# Patient Record
Sex: Male | Born: 1955 | Race: White | Hispanic: No | Marital: Married | Smoking: Never smoker
Health system: Southern US, Community
[De-identification: ages and names within clinical notes are randomized; demographics above are authoritative.]

## PROBLEM LIST (undated history)

## (undated) DIAGNOSIS — Z933 Colostomy status: Secondary | ICD-10-CM

## (undated) DIAGNOSIS — T7840XA Allergy, unspecified, initial encounter: Secondary | ICD-10-CM

## (undated) DIAGNOSIS — N503 Cyst of epididymis: Secondary | ICD-10-CM

## (undated) DIAGNOSIS — K635 Polyp of colon: Secondary | ICD-10-CM

## (undated) DIAGNOSIS — D126 Benign neoplasm of colon, unspecified: Secondary | ICD-10-CM

## (undated) DIAGNOSIS — K219 Gastro-esophageal reflux disease without esophagitis: Secondary | ICD-10-CM

## (undated) DIAGNOSIS — K5792 Diverticulitis of intestine, part unspecified, without perforation or abscess without bleeding: Secondary | ICD-10-CM

## (undated) DIAGNOSIS — I10 Essential (primary) hypertension: Secondary | ICD-10-CM

## (undated) DIAGNOSIS — K5790 Diverticulosis of intestine, part unspecified, without perforation or abscess without bleeding: Secondary | ICD-10-CM

## (undated) DIAGNOSIS — J45909 Unspecified asthma, uncomplicated: Secondary | ICD-10-CM

## (undated) DIAGNOSIS — E785 Hyperlipidemia, unspecified: Secondary | ICD-10-CM

## (undated) DIAGNOSIS — E790 Hyperuricemia without signs of inflammatory arthritis and tophaceous disease: Secondary | ICD-10-CM

## (undated) HISTORY — PX: APPENDECTOMY: SHX54

## (undated) HISTORY — PX: WISDOM TOOTH EXTRACTION: SHX21

## (undated) HISTORY — DX: Polyp of colon: K63.5

## (undated) HISTORY — DX: Unspecified asthma, uncomplicated: J45.909

## (undated) HISTORY — DX: Hyperuricemia without signs of inflammatory arthritis and tophaceous disease: E79.0

## (undated) HISTORY — DX: Diverticulitis of intestine, part unspecified, without perforation or abscess without bleeding: K57.92

## (undated) HISTORY — DX: Diverticulosis of intestine, part unspecified, without perforation or abscess without bleeding: K57.90

## (undated) HISTORY — PX: OTHER SURGICAL HISTORY: SHX169

## (undated) HISTORY — DX: Cyst of epididymis: N50.3

## (undated) HISTORY — DX: Benign neoplasm of colon, unspecified: D12.6

## (undated) HISTORY — DX: Hyperlipidemia, unspecified: E78.5

## (undated) HISTORY — DX: Allergy, unspecified, initial encounter: T78.40XA

## (undated) HISTORY — DX: Essential (primary) hypertension: I10

## (undated) HISTORY — DX: Gastro-esophageal reflux disease without esophagitis: K21.9

---
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
## 1999-02-09 ENCOUNTER — Encounter: Payer: Self-pay | Admitting: Internal Medicine

## 1999-02-09 ENCOUNTER — Ambulatory Visit (HOSPITAL_COMMUNITY): Admission: RE | Admit: 1999-02-09 | Discharge: 1999-02-09 | Payer: Self-pay | Admitting: Internal Medicine

## 2000-03-08 ENCOUNTER — Encounter (INDEPENDENT_AMBULATORY_CARE_PROVIDER_SITE_OTHER): Payer: Self-pay | Admitting: Specialist

## 2000-03-08 ENCOUNTER — Other Ambulatory Visit: Admission: RE | Admit: 2000-03-08 | Discharge: 2000-03-08 | Payer: Self-pay | Admitting: Gastroenterology

## 2004-08-18 ENCOUNTER — Ambulatory Visit: Payer: Self-pay | Admitting: Internal Medicine

## 2004-08-29 ENCOUNTER — Ambulatory Visit: Payer: Self-pay | Admitting: Internal Medicine

## 2004-09-19 ENCOUNTER — Encounter: Admission: RE | Admit: 2004-09-19 | Discharge: 2004-09-19 | Payer: Self-pay | Admitting: Internal Medicine

## 2004-10-19 ENCOUNTER — Ambulatory Visit: Payer: Self-pay | Admitting: Internal Medicine

## 2004-12-22 ENCOUNTER — Ambulatory Visit: Payer: Self-pay | Admitting: Internal Medicine

## 2004-12-26 ENCOUNTER — Encounter: Payer: Self-pay | Admitting: Internal Medicine

## 2005-09-19 ENCOUNTER — Encounter: Payer: Self-pay | Admitting: Internal Medicine

## 2007-07-18 DIAGNOSIS — D126 Benign neoplasm of colon, unspecified: Secondary | ICD-10-CM

## 2007-07-18 HISTORY — DX: Benign neoplasm of colon, unspecified: D12.6

## 2008-02-26 ENCOUNTER — Encounter (INDEPENDENT_AMBULATORY_CARE_PROVIDER_SITE_OTHER): Payer: Self-pay | Admitting: *Deleted

## 2008-02-26 ENCOUNTER — Ambulatory Visit: Payer: Self-pay | Admitting: Internal Medicine

## 2008-02-26 DIAGNOSIS — J45909 Unspecified asthma, uncomplicated: Secondary | ICD-10-CM | POA: Insufficient documentation

## 2008-02-26 DIAGNOSIS — T783XXA Angioneurotic edema, initial encounter: Secondary | ICD-10-CM | POA: Insufficient documentation

## 2008-02-26 DIAGNOSIS — E785 Hyperlipidemia, unspecified: Secondary | ICD-10-CM | POA: Insufficient documentation

## 2008-02-26 DIAGNOSIS — R7309 Other abnormal glucose: Secondary | ICD-10-CM | POA: Insufficient documentation

## 2008-02-27 ENCOUNTER — Telehealth: Payer: Self-pay | Admitting: Internal Medicine

## 2008-02-28 ENCOUNTER — Encounter (INDEPENDENT_AMBULATORY_CARE_PROVIDER_SITE_OTHER): Payer: Self-pay | Admitting: *Deleted

## 2008-03-16 ENCOUNTER — Ambulatory Visit: Payer: Self-pay | Admitting: Gastroenterology

## 2008-03-30 ENCOUNTER — Encounter: Payer: Self-pay | Admitting: Gastroenterology

## 2008-03-30 ENCOUNTER — Ambulatory Visit: Payer: Self-pay | Admitting: Gastroenterology

## 2008-03-31 ENCOUNTER — Telehealth: Payer: Self-pay | Admitting: Gastroenterology

## 2008-04-01 ENCOUNTER — Encounter: Payer: Self-pay | Admitting: Gastroenterology

## 2008-04-02 ENCOUNTER — Telehealth: Payer: Self-pay | Admitting: Gastroenterology

## 2008-04-29 DIAGNOSIS — Z860100 Personal history of colon polyps, unspecified: Secondary | ICD-10-CM | POA: Insufficient documentation

## 2008-04-29 DIAGNOSIS — K5730 Diverticulosis of large intestine without perforation or abscess without bleeding: Secondary | ICD-10-CM | POA: Insufficient documentation

## 2009-06-16 ENCOUNTER — Ambulatory Visit: Payer: Self-pay | Admitting: Internal Medicine

## 2009-06-16 DIAGNOSIS — I10 Essential (primary) hypertension: Secondary | ICD-10-CM | POA: Insufficient documentation

## 2010-08-14 LAB — CONVERTED CEMR LAB
ALT: 23 units/L (ref 0–53)
AST: 17 units/L (ref 0–37)
Albumin: 4.2 g/dL (ref 3.5–5.2)
Alkaline Phosphatase: 55 units/L (ref 39–117)
BUN: 20 mg/dL (ref 6–23)
Basophils Absolute: 0 10*3/uL (ref 0.0–0.1)
Basophils Relative: 0.4 % (ref 0.0–3.0)
Bilirubin, Direct: 0.1 mg/dL (ref 0.0–0.3)
CO2: 28 meq/L (ref 19–32)
Calcium: 9.5 mg/dL (ref 8.4–10.5)
Chloride: 106 meq/L (ref 96–112)
Creatinine, Ser: 1.5 mg/dL (ref 0.4–1.5)
Eosinophils Absolute: 0.2 10*3/uL (ref 0.0–0.7)
Eosinophils Relative: 2.8 % (ref 0.0–5.0)
GFR calc Af Amer: 63 mL/min
GFR calc non Af Amer: 52 mL/min
Glucose, Bld: 93 mg/dL (ref 70–99)
HCT: 44.8 % (ref 39.0–52.0)
Hemoglobin: 15.6 g/dL (ref 13.0–17.0)
Hgb A1c MFr Bld: 5.9 % (ref 4.6–6.0)
Lymphocytes Relative: 30.7 % (ref 12.0–46.0)
MCHC: 34.8 g/dL (ref 30.0–36.0)
MCV: 86.5 fL (ref 78.0–100.0)
Monocytes Absolute: 0.6 10*3/uL (ref 0.1–1.0)
Monocytes Relative: 10.6 % (ref 3.0–12.0)
Neutro Abs: 3.1 10*3/uL (ref 1.4–7.7)
Neutrophils Relative %: 55.5 % (ref 43.0–77.0)
PSA: 0.83 ng/mL (ref 0.10–4.00)
Platelets: 181 10*3/uL (ref 150–400)
Potassium: 4 meq/L (ref 3.5–5.1)
RBC: 5.18 M/uL (ref 4.22–5.81)
RDW: 13.4 % (ref 11.5–14.6)
Sodium: 142 meq/L (ref 135–145)
TSH: 1.79 microintl units/mL (ref 0.35–5.50)
Total Bilirubin: 0.8 mg/dL (ref 0.3–1.2)
Total Protein: 7.2 g/dL (ref 6.0–8.3)
WBC: 5.6 10*3/uL (ref 4.5–10.5)

## 2011-06-15 ENCOUNTER — Encounter: Payer: Self-pay | Admitting: Gastroenterology

## 2011-08-15 ENCOUNTER — Ambulatory Visit (INDEPENDENT_AMBULATORY_CARE_PROVIDER_SITE_OTHER): Payer: 59 | Admitting: Internal Medicine

## 2011-08-15 VITALS — BP 160/88 | HR 110 | Temp 99.0°F | Wt 282.0 lb

## 2011-08-15 DIAGNOSIS — J329 Chronic sinusitis, unspecified: Secondary | ICD-10-CM

## 2011-08-15 MED ORDER — AMOXICILLIN 500 MG PO CAPS: 1000.0000 mg | ORAL_CAPSULE | Freq: Two times a day (BID) | ORAL | Status: AC

## 2011-08-15 NOTE — Progress Notes (Signed)
  Subjective:    Patient ID: Javier Casey, male    DOB: May 20, 1956, 56 y.o.   MRN: [id number]  HPI Acute visit Reportedly he had the flu between Christmas and new year, stayed home all week, did not see a doctor or took meds. Since then symptoms are better but he is "dragging", not feeling completely well The last 4 days, the cough has increased, he has more nasal congestion and postnasal drip. Has not taken any over-the-counter medicines except Halls for cough  Past Medical History: Asthma (EIB) Hyperlipidemia DIVERTICULOSIS, COLON (ICD-562.10) COLONIC POLYPS, HX OF (ICD-V12.72) HYPERGLYCEMIA (ICD-790.29) ANGIOEDEMA (ICD-995.1)    Review of Systems Denies fever or chills No nausea, vomiting, diarrhea No sputum production per se but he has some white nasal discharge.    Objective:   Physical Exam  Constitutional: He appears well-developed. No distress.  HENT:  Head: Normocephalic and atraumatic.  Right Ear: External ear normal.  Left Ear: External ear normal.       Asymmetric, nontender to palpation. Nose congested. Throat without redness or discharge  Cardiovascular: Normal rate, regular rhythm and normal heart sounds.   No murmur heard. Pulmonary/Chest: Effort normal. No respiratory distress. He has no wheezes. He has no rales.  Musculoskeletal: He exhibits no edema.  Skin: He is not diaphoretic.       Assessment & Plan:  Mild sinusitis: See instruction Hypertension: Was prescribed amlodipine in the past, has not taken any medications a long time. BP today is elevated but ambulatory blood pressures are taken on and off by the patient and they're normal. See instructions

## 2011-08-15 NOTE — Patient Instructions (Signed)
Rest, fluids , tylenol For cough, take Mucinex DM twice a day as needed  For congestion use dymista 2 sprays on each side of the nose at night until sample is gone  Take the antibiotic as prescribed ----> Amoxicillin Call if no better in few days Call anytime if the symptoms are severe ------- Your BP is elevated today Check the  blood pressure 2 or 3 times a week, be sure it is less than 140/85. If it is consistently higher, let Hopp know Also arrange a visit with him for a check up within 6 weeks

## 2011-08-16 ENCOUNTER — Encounter: Payer: Self-pay | Admitting: Internal Medicine

## 2011-10-06 ENCOUNTER — Encounter: Payer: Self-pay | Admitting: Internal Medicine

## 2011-10-06 ENCOUNTER — Ambulatory Visit (INDEPENDENT_AMBULATORY_CARE_PROVIDER_SITE_OTHER): Payer: 59 | Admitting: Internal Medicine

## 2011-10-06 VITALS — BP 130/88 | HR 74 | Temp 98.3°F | Resp 12 | Ht 75.5 in | Wt 283.6 lb

## 2011-10-06 DIAGNOSIS — J45909 Unspecified asthma, uncomplicated: Secondary | ICD-10-CM

## 2011-10-06 DIAGNOSIS — Z136 Encounter for screening for cardiovascular disorders: Secondary | ICD-10-CM

## 2011-10-06 DIAGNOSIS — T783XXA Angioneurotic edema, initial encounter: Secondary | ICD-10-CM

## 2011-10-06 DIAGNOSIS — Z0000 Encounter for general adult medical examination without abnormal findings: Secondary | ICD-10-CM
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
## 2011-10-06 NOTE — Patient Instructions (Addendum)
Preventive Health Care: Exercise at least 30-45 minutes a day,  3-4 days a week.  Eat a low-fat diet with lots of fruits and vegetables, up to 7-9 servings per day. Consume less than 40 (preferably ZERO) grams of sugar per day from foods & drinks with High Fructose Corn Syrup (HFCS) sugar as #1,2,3 or # 4 on label.Whole Foods, [employer] & [employer] do not carry products with HFCS. Follow a  low carb nutrition program such as [place] or [employer]  to prevent Diabetes progression . White carbohydrates (potatoes, rice, bread, and pasta) have a high spike of sugar and a high load of sugar. For example a  baked potato has a cup of sugar and a  french fry  2 teaspoons of sugar. Yams, wild  rice, whole grained bread &  wheat pasta have been much lower spike and load of  sugar. Portions should be the size of a deck of cards or your palm.  Health Care Power of Attorney & Living Will. Complete if not in place ; these place you in charge of your health care decisions. Blood Pressure Goal  Ideally is an AVERAGE < 135/85. This AVERAGE should be calculated from @ least 5-7 BP readings taken @ different times of day on different days of week. You should not respond to isolated BP readings , but rather the AVERAGE for that week . Please consider  scheduling fasting Labs : BMET,Lipids, hepatic panel, CBC & dif, TSH. PLEASE BRING THESE INSTRUCTIONS TO FOLLOW UP  LAB APPOINTMENT.This will guarantee correct labs are drawn, eliminating need for repeat blood sampling ( needle sticks ! ). Diagnoses /Codes: V70.0 As per the Standard of Care , screening Colonoscopy recommended @ 50 & every 5-10 years thereafter . More frequent monitor would be dictated by family history or findings @ Colonoscopy

## 2011-10-11 ENCOUNTER — Encounter: Payer: Self-pay | Admitting: *Deleted

## 2012-04-08 ENCOUNTER — Encounter: Payer: Self-pay | Admitting: Gastroenterology

## 2013-07-28 ENCOUNTER — Encounter: Payer: Self-pay | Admitting: Internal Medicine

## 2013-07-28 ENCOUNTER — Ambulatory Visit (INDEPENDENT_AMBULATORY_CARE_PROVIDER_SITE_OTHER): Payer: 59 | Admitting: Internal Medicine

## 2013-07-28 VITALS — BP 148/68 | HR 82 | Temp 98.4°F | Wt 294.0 lb

## 2013-07-28 DIAGNOSIS — Z86010 Personal history of colonic polyps: Secondary | ICD-10-CM

## 2013-07-28 DIAGNOSIS — M722 Plantar fascial fibromatosis: Secondary | ICD-10-CM

## 2013-07-28 DIAGNOSIS — E785 Hyperlipidemia, unspecified: Secondary | ICD-10-CM

## 2013-07-28 DIAGNOSIS — I10 Essential (primary) hypertension: Secondary | ICD-10-CM

## 2013-07-28 DIAGNOSIS — T887XXA Unspecified adverse effect of drug or medicament, initial encounter: Secondary | ICD-10-CM

## 2013-07-28 MED ORDER — HYDROCHLOROTHIAZIDE 12.5 MG PO CAPS: 12.5000 mg | ORAL_CAPSULE | Freq: Every day | ORAL | Status: DC

## 2013-07-28 NOTE — Assessment & Plan Note (Signed)
As per the Standard of Care , screening Colonoscopy recommended now

## 2013-07-28 NOTE — Progress Notes (Signed)
   Subjective:    Patient ID: Javier Casey, male    DOB: 25-Aug-1955, 58 y.o.   MRN: [id number]  HPI Itchy spot on ball of right foot. Right foot hurts in heel, achilles tendon, and lateral aspect of foot since February 16, 2013. Pain 3 to 7/10. No precipitating factor. Never had this before. Standing for long periods and and walking on concrete makes pain worse. Hurts first step out of bed in am. Had foot and leg cramps also. Ibuprofen helps a small amount.     Review of Systems Blood pressure  average 130/86. Anti hypertemsive medication was not refilled when he ran out. Significant headaches, epistaxis, chest pain, palpitations, exertional dyspnea, claudication, paroxysmal nocturnal dyspnea, or edema absent.  A heart healthy diet is not followed; no regular exercise. Family history is negative for premature coronary disease. Advanced cholesterol testing reveals  LDL goal is less than 100 ; ideally < 70 . There is medication compliance with the statin.  Low dose ASA not taken Specifically denied are  chest pain, palpitations, dyspnea, or claudication.       Objective:   Physical Exam Gen.: well-nourished in appearance. Weight excess.Alert, appropriate and cooperative throughout exam.  Head: Normocephalic without obvious abnormalities Eyes: No corneal or conjunctival inflammation noted. Pupils equal round reactive to light and accommodation. Extraocular motion intact. Nose: External nasal exam reveals no deformity or inflammation. Nasal mucosa are pink and moist. No lesions or exudates noted.   Mouth: Oral mucosa and oropharynx reveal no lesions or exudates. Teeth in good repair. Neck: No deformities, masses, or tenderness noted.  Thyroid normal. Lungs: Normal respiratory effort; chest expands symmetrically. Lungs are clear to auscultation without rales, wheezes, or increased work of breathing. Heart: Normal rate and rhythm. Normal S1 and S2. No gallop, click, or rub. S4 w/o  murmur. Abdomen: Bowel sounds normal; abdomen soft and nontender. No masses or organomegaly . Ventral hernia noted.                                   Musculoskeletal/extremities: No deformity or scoliosis noted of  the thoracic or lumbar spine.  No clubbing, cyanosis or significant extremity  deformity noted. 1 + edema.Range of motion normal .Tone & strength normal. Hand joints normal . Fingernail / toenail health good. Able to lie down & sit up w/o help. Negative SLR bilaterally. Plantar fascia tender to percussion Vascular: Carotid, radial artery, dorsalis pedis and  posterior tibial pulses are full and equal. No bruits present. Neurologic: Alert and oriented x3. Deep tendon reflexes symmetrical and normal.       Skin: Intact without suspicious lesions or rashes. Lymph: No cervical, axillary lymphadenopathy present. Psych: Mood and affect are normal. Normally interactive                                                                                        Assessment & Plan:  See Current Assessment & Plan in Problem List under specific Diagnosis

## 2013-07-28 NOTE — Assessment & Plan Note (Signed)
Goals discussed  BMET HCTZ 12.5 mg

## 2013-07-28 NOTE — Assessment & Plan Note (Signed)
See recommendations in the after visit summary

## 2013-07-28 NOTE — Patient Instructions (Signed)
Roll the affected foot over a tennis ball 20 times twice a day. After this soak the foot in warm Epsom salts for 15-20 minutes. Wear arch supports in both shoes. Referral  To Dr Charlann Boxer if symptoms persist.  Please  schedule fasting Labs : BMET,Lipids, hepatic panel, CBC & dif, TSH, uric acid. Minimal Blood Pressure Goal= AVERAGE < 140/90;  Ideal is an AVERAGE < 135/85. This AVERAGE should be calculated from @ least 5-7 BP readings taken @ different times of day on different days of week. You should not respond to isolated BP readings , but rather the AVERAGE for that week .Please bring your  blood pressure cuff to office visits to verify that it is reliable.It  can also be checked against the blood pressure device at the pharmacy. Finger or wrist cuffs are not dependable; an arm cuff is. As per the Standard of Care , screening Colonoscopy recommended @ 50 & every 5-10 years thereafter . More frequent monitor would be dictated by family history or findings @ Colonoscopy

## 2013-07-28 NOTE — Assessment & Plan Note (Signed)
TSH, lipids fasting

## 2013-08-01 ENCOUNTER — Other Ambulatory Visit (INDEPENDENT_AMBULATORY_CARE_PROVIDER_SITE_OTHER): Payer: 59

## 2013-08-01 DIAGNOSIS — T887XXA Unspecified adverse effect of drug or medicament, initial encounter: Secondary | ICD-10-CM

## 2013-08-01 DIAGNOSIS — E785 Hyperlipidemia, unspecified: Secondary | ICD-10-CM

## 2013-08-01 DIAGNOSIS — Z86010 Personal history of colonic polyps: Secondary | ICD-10-CM

## 2013-08-01 DIAGNOSIS — I10 Essential (primary) hypertension: Secondary | ICD-10-CM

## 2013-08-01 LAB — LIPID PANEL
CHOL/HDL RATIO: 6
CHOLESTEROL: 222 mg/dL — AB (ref 0–200)
HDL: 34.8 mg/dL — AB (ref >39.00)
TRIGLYCERIDES: 144 mg/dL (ref 0.0–149.0)
VLDL: 28.8 mg/dL (ref 0.0–40.0)

## 2013-08-01 LAB — BASIC METABOLIC PANEL
BUN: 19 mg/dL (ref 6–23)
CALCIUM: 9.1 mg/dL (ref 8.4–10.5)
CO2: 26 mEq/L (ref 19–32)
CREATININE: 1.7 mg/dL — AB (ref 0.4–1.5)
Chloride: 101 mEq/L (ref 96–112)
GFR: 45.15 mL/min — AB (ref >60.00)
Glucose, Bld: 94 mg/dL (ref 70–99)
Potassium: 3.9 mEq/L (ref 3.5–5.1)
Sodium: 136 mEq/L (ref 135–145)

## 2013-08-01 LAB — HEPATIC FUNCTION PANEL
ALBUMIN: 4 g/dL (ref 3.5–5.2)
ALT: 27 U/L (ref 0–53)
AST: 18 U/L (ref 0–37)
Alkaline Phosphatase: 57 U/L (ref 39–117)
BILIRUBIN TOTAL: 0.8 mg/dL (ref 0.3–1.2)
Bilirubin, Direct: 0 mg/dL (ref 0.0–0.3)
Total Protein: 7.2 g/dL (ref 6.0–8.3)

## 2013-08-01 LAB — CBC WITH DIFFERENTIAL/PLATELET
Basophils Absolute: 0 10*3/uL (ref 0.0–0.1)
Basophils Relative: 0.7 % (ref 0.0–3.0)
Eosinophils Absolute: 0.2 10*3/uL (ref 0.0–0.7)
Eosinophils Relative: 3.9 % (ref 0.0–5.0)
HCT: 44.8 % (ref 39.0–52.0)
Hemoglobin: 15.2 g/dL (ref 13.0–17.0)
LYMPHS ABS: 1.6 10*3/uL (ref 0.7–4.0)
Lymphocytes Relative: 25.8 % (ref 12.0–46.0)
MCHC: 34 g/dL (ref 30.0–36.0)
MCV: 85.2 fl (ref 78.0–100.0)
Monocytes Absolute: 0.7 10*3/uL (ref 0.1–1.0)
Monocytes Relative: 10.5 % (ref 3.0–12.0)
NEUTROS PCT: 59.1 % (ref 43.0–77.0)
Neutro Abs: 3.7 10*3/uL (ref 1.4–7.7)
Platelets: 200 10*3/uL (ref 150.0–400.0)
RBC: 5.26 Mil/uL (ref 4.22–5.81)
RDW: 14.3 % (ref 11.5–14.6)
WBC: 6.2 10*3/uL (ref 4.5–10.5)

## 2013-08-01 LAB — TSH: TSH: 1.26 u[IU]/mL (ref 0.35–5.50)

## 2013-08-01 LAB — LDL CHOLESTEROL, DIRECT: LDL DIRECT: 170.4 mg/dL

## 2013-08-01 LAB — URIC ACID: Uric Acid, Serum: 7.9 mg/dL — ABNORMAL HIGH (ref 4.0–7.8)

## 2013-08-03 ENCOUNTER — Other Ambulatory Visit: Payer: Self-pay | Admitting: Internal Medicine

## 2013-08-03 DIAGNOSIS — E790 Hyperuricemia without signs of inflammatory arthritis and tophaceous disease: Secondary | ICD-10-CM | POA: Insufficient documentation

## 2013-08-03 DIAGNOSIS — E785 Hyperlipidemia, unspecified: Secondary | ICD-10-CM

## 2013-08-03 DIAGNOSIS — I10 Essential (primary) hypertension: Secondary | ICD-10-CM

## 2013-08-03 MED ORDER — ATORVASTATIN CALCIUM 20 MG PO TABS: 20.0000 mg | ORAL_TABLET | Freq: Every day | ORAL | Status: DC

## 2013-08-03 MED ORDER — LOSARTAN POTASSIUM 100 MG PO TABS: 100.0000 mg | ORAL_TABLET | Freq: Every day | ORAL | Status: DC

## 2013-08-04 ENCOUNTER — Encounter: Payer: Self-pay | Admitting: Nurse Practitioner

## 2013-08-04 ENCOUNTER — Ambulatory Visit (INDEPENDENT_AMBULATORY_CARE_PROVIDER_SITE_OTHER): Payer: 59 | Admitting: Nurse Practitioner

## 2013-08-04 VITALS — BP 140/84 | HR 104 | Temp 98.0°F | Ht 75.5 in | Wt 289.0 lb

## 2013-08-04 DIAGNOSIS — N419 Inflammatory disease of prostate, unspecified: Secondary | ICD-10-CM

## 2013-08-04 DIAGNOSIS — N400 Enlarged prostate without lower urinary tract symptoms: Secondary | ICD-10-CM

## 2013-08-04 MED ORDER — SULFAMETHOXAZOLE-TMP DS 800-160 MG PO TABS: 1.0000 | ORAL_TABLET | Freq: Two times a day (BID) | ORAL | Status: DC

## 2013-08-04 MED ORDER — TERAZOSIN HCL 1 MG PO CAPS: 1.0000 mg | ORAL_CAPSULE | Freq: Every day | ORAL | Status: DC

## 2013-08-04 NOTE — Progress Notes (Signed)
   Subjective:    Patient ID: Javier Casey, male    DOB: 10-06-1955, 58 y.o.   MRN: [id number]  Abdominal Pain This is a new problem. The current episode started today. The onset quality is sudden. The problem occurs constantly. The most recent episode lasted 8 hours. The problem has been gradually worsening. The pain is located in the suprapubic region. The pain is moderate. The quality of the pain is aching. The abdominal pain does not radiate. Associated symptoms include dysuria and frequency. Pertinent negatives include no constipation, diarrhea, fever, headaches, hematuria or nausea. Associated symptoms comments: Decreased urine stream . Nothing aggravates the pain. The pain is relieved by nothing. He has tried nothing for the symptoms. diverticulosis & colon polyps      Review of Systems  Constitutional: Negative for fever, chills, activity change, appetite change and fatigue.  HENT: Negative for congestion.   Respiratory: Negative for cough and shortness of breath.   Cardiovascular: Negative for chest pain.  Gastrointestinal: Positive for abdominal pain. Negative for nausea, diarrhea and constipation.  Genitourinary: Positive for dysuria, urgency, frequency and decreased urine volume. Negative for hematuria, flank pain, discharge, scrotal swelling, penile pain and testicular pain.  Musculoskeletal: Positive for back pain (chronic, no change).  Neurological: Negative for headaches.       Objective:   Physical Exam  Vitals reviewed. Constitutional: He is oriented to person, place, and time. He appears well-developed and well-nourished. He appears distressed (states he is in a lot of pain, hasn't tried any OTC meds.).  HENT:  Head: Normocephalic and atraumatic.  Eyes: Conjunctivae are normal. Right eye exhibits no discharge. Left eye exhibits no discharge.  Cardiovascular: Regular rhythm and normal heart sounds.   No murmur heard. Mild tachycardia   Pulmonary/Chest: Effort  normal and breath sounds normal. No respiratory distress. He has no wheezes.  Abdominal: Soft. Bowel sounds are normal. He exhibits distension. He exhibits no mass. There is no tenderness. There is no rebound and no guarding.  obese  Musculoskeletal: He exhibits no tenderness (no CVA tenderness).  Neurological: He is alert and oriented to person, place, and time.  Skin: Skin is warm and dry.  Psychiatric: He has a normal mood and affect. His behavior is normal. Thought content normal.          Assessment & Plan:  1. Prostatitis  Suprapubic pressure w/ frequency DD: cystitis - sulfamethoxazole-trimethoprim (BACTRIM DS) 800-160 MG per tablet; Take 1 tablet by mouth 2 (two) times daily.  Dispense: 28 tablet; Refill: 0 - POCT urinalysis dipstick- sm blood, trace protein.  - Urinalysis, Routine w reflex microscopic - Urine culture  2. Benign prostatic hypertrophy Decreased urine stream, frequency,  - terazosin (HYTRIN) 1 MG capsule; Take 1 capsule (1 mg total) by mouth at bedtime.  Dispense: 15 capsule; Refill: 0

## 2013-08-04 NOTE — Progress Notes (Signed)
Pre-visit discussion using our clinic review tool. No additional management support is needed unless otherwise documented below in the visit note.  

## 2013-08-04 NOTE — Patient Instructions (Signed)
Please start antibiotic and terazosin. You may take 1000 mg tylenol 3 times daily for pain. Please sip water every hour to flush kidneys & bladder. Please follow up with Dr Linna Darner in 2 weeks or sooner if you are feeling worse or have not improved within 3-4 days.   Prostatitis The prostate gland is about the size and shape of a walnut. It is located just below your bladder. It produces one of the components of semen, which is made up of sperm and the fluids that help nourish and transport it out from the testicles. Prostatitis is inflammation of the prostate gland.  There are four types of prostatitis:  Acute bacterial prostatitis This is the least common type of prostatitis. It starts quickly and usually is associated with a bladder infection, high fever, and shaking chills. It can occur at any age.  Chronic bacterial prostatitis This is a persistent bacterial infection in the prostate. It usually develops from repeated acute bacterial prostatitis or acute bacterial prostatitis that was not properly treated. It can occur in men of any age but is most common in middle-aged men whose prostate has begun to enlarge. The symptoms are not as severe as those in acute bacterial prostatitis. Discomfort in the part of your body that is in front of your rectum and below your scrotum (perineum), lower abdomen, or in the head of your penis (glans) may represent your primary discomfort.  Chronic prostatitis (nonbacterial) This is the most common type of prostatitis. It is inflammation of the prostate gland that is not caused by a bacterial infection. The cause is unknown and may be associated with a viral infection or autoimmune disorder.  Prostatodynia (pelvic floor disorder) This is associated with increased muscular tone in the pelvis surrounding the prostate. CAUSES The causes of bacterial prostatitis are bacterial infection. The causes of the other types of prostatitis are unknown.  SYMPTOMS  Symptoms can  vary depending upon the type of prostatitis that exists. There can also be overlap in symptoms. Possible symptoms for each type of prostatitis are listed below. Acute Bacterial Prostatitis  Painful urination.  Fever or chills.  Muscle or joint pains.  Low back pain.  Low abdominal pain.  Inability to empty bladder completely. Chronic Bacterial Prostatitis, Chronic Nonbacterial Prostatitis, and Prostatodynia  Sudden urge to urinate.  Frequent urination.  Difficulty starting urine stream.  Weak urine stream.  Discharge from the urethra.  Dribbling after urination.  Rectal pain.  Pain in the testicles, penis, or tip of the penis.  Pain in the perineum.  Problems with sexual function.  Painful ejaculation.  Bloody semen. DIAGNOSIS  In order to diagnose prostatitis, your health care provider will ask about your symptoms. One or more urine samples will be taken and tested (urinalysis). If the urinalysis result is negative for bacteria, your health care provider may use a finger to feel your prostate (digital rectal exam). This exam helps your health care provider determine if your prostate is swollen and tender. It will also produce a specimen of semen that can be analyzed. TREATMENT  Treatment for prostatitis depends on the cause. If a bacterial infection is the cause, it can be treated with antibiotic medicine. In cases of chronic bacterial prostatitis, the use of antibiotics for up to 1 month or 6 weeks may be necessary. Your health care provider may instruct you to take sitz baths to help relieve pain. A sitz bath is a bath of hot water in which your hips and buttocks are under  water. This relaxes the pelvic floor muscles and often helps to relieve the pressure on your prostate. HOME CARE INSTRUCTIONS   Take all medicines as directed by your health care provider.  Take sitz baths as directed by your health care provider. SEEK MEDICAL CARE IF:   Your symptoms get worse,  not better.  You have a fever. SEEK IMMEDIATE MEDICAL CARE IF:   You have chills.  You feel nauseous or vomit.  You feel lightheaded or faint.  You are unable to urinate.  You have blood or blood clots in your urine. Document Released: 06/30/2000 Document Revised: 04/23/2013 Document Reviewed: 01/20/2013 [facility] Patient Information 2014 [place].

## 2013-08-05 LAB — POCT URINALYSIS DIPSTICK
BILIRUBIN UA: NEGATIVE
GLUCOSE UA: NEGATIVE
KETONES UA: NEGATIVE
Leukocytes, UA: NEGATIVE
NITRITE UA: NEGATIVE
Spec Grav, UA: 1.025
Urobilinogen, UA: 0.2
pH, UA: 6

## 2013-08-05 LAB — URINALYSIS, ROUTINE W REFLEX MICROSCOPIC
Bilirubin Urine: NEGATIVE
Ketones, ur: NEGATIVE
LEUKOCYTES UA: NEGATIVE
NITRITE: NEGATIVE
Specific Gravity, Urine: 1.025 (ref 1.000–1.030)
Total Protein, Urine: NEGATIVE
URINE GLUCOSE: NEGATIVE
UROBILINOGEN UA: 0.2 (ref 0.0–1.0)
pH: 6 (ref 5.0–8.0)

## 2013-08-06 LAB — URINE CULTURE
COLONY COUNT: NO GROWTH
Organism ID, Bacteria: NO GROWTH

## 2013-08-19 ENCOUNTER — Telehealth: Payer: Self-pay | Admitting: Internal Medicine

## 2013-08-19 NOTE — Telephone Encounter (Signed)
Relevant patient education mailed to patient.  

## 2013-08-21 ENCOUNTER — Other Ambulatory Visit: Payer: Self-pay | Admitting: *Deleted

## 2013-08-21 ENCOUNTER — Telehealth: Payer: Self-pay | Admitting: *Deleted

## 2013-08-21 DIAGNOSIS — E785 Hyperlipidemia, unspecified: Secondary | ICD-10-CM

## 2013-08-21 DIAGNOSIS — I10 Essential (primary) hypertension: Secondary | ICD-10-CM

## 2013-08-21 MED ORDER — ATORVASTATIN CALCIUM 20 MG PO TABS: 20.0000 mg | ORAL_TABLET | Freq: Every day | ORAL | Status: DC

## 2013-08-21 MED ORDER — LOSARTAN POTASSIUM 100 MG PO TABS: 100.0000 mg | ORAL_TABLET | Freq: Every day | ORAL | Status: DC

## 2013-08-21 NOTE — Telephone Encounter (Signed)
Patient called and stated that his rx's have not been sent to the pharmacy yet. atorvastatin (LIPITOR) 20 MG tablet and losartan (COZAAR) 100 MG tablet   Pharmacy   [id number] - [place], [place] - [address]

## 2013-08-21 NOTE — Telephone Encounter (Signed)
Done. JG//CMA 

## 2013-11-24 ENCOUNTER — Other Ambulatory Visit: Payer: Self-pay | Admitting: Internal Medicine

## 2013-11-25 ENCOUNTER — Telehealth: Payer: Self-pay | Admitting: Nurse Practitioner

## 2013-11-25 ENCOUNTER — Other Ambulatory Visit (INDEPENDENT_AMBULATORY_CARE_PROVIDER_SITE_OTHER): Payer: 59

## 2013-11-25 DIAGNOSIS — R7989 Other specified abnormal findings of blood chemistry: Secondary | ICD-10-CM

## 2013-11-25 DIAGNOSIS — E785 Hyperlipidemia, unspecified: Secondary | ICD-10-CM

## 2013-11-25 DIAGNOSIS — I10 Essential (primary) hypertension: Secondary | ICD-10-CM

## 2013-11-25 DIAGNOSIS — Z86010 Personal history of colonic polyps: Secondary | ICD-10-CM

## 2013-11-25 DIAGNOSIS — E790 Hyperuricemia without signs of inflammatory arthritis and tophaceous disease: Secondary | ICD-10-CM

## 2013-11-25 LAB — HEPATIC FUNCTION PANEL
ALBUMIN: 4.1 g/dL (ref 3.5–5.2)
ALT: 28 U/L (ref 0–53)
AST: 22 U/L (ref 0–37)
Alkaline Phosphatase: 60 U/L (ref 39–117)
BILIRUBIN DIRECT: 0.1 mg/dL (ref 0.0–0.3)
Total Bilirubin: 0.9 mg/dL (ref 0.2–1.2)
Total Protein: 6.9 g/dL (ref 6.0–8.3)

## 2013-11-25 LAB — BASIC METABOLIC PANEL
BUN: 17 mg/dL (ref 6–23)
CHLORIDE: 106 meq/L (ref 96–112)
CO2: 26 mEq/L (ref 19–32)
Calcium: 9.5 mg/dL (ref 8.4–10.5)
Creatinine, Ser: 1.4 mg/dL (ref 0.4–1.5)
GFR: 56.21 mL/min — ABNORMAL LOW (ref >60.00)
Glucose, Bld: 89 mg/dL (ref 70–99)
POTASSIUM: 4.3 meq/L (ref 3.5–5.1)
Sodium: 139 mEq/L (ref 135–145)

## 2013-11-25 LAB — LIPID PANEL
Cholesterol: 176 mg/dL (ref 0–200)
HDL: 37.2 mg/dL — ABNORMAL LOW
LDL Cholesterol: 100 mg/dL — ABNORMAL HIGH (ref 0–99)
Total CHOL/HDL Ratio: 5
Triglycerides: 196 mg/dL — ABNORMAL HIGH (ref 0.0–149.0)
VLDL: 39.2 mg/dL (ref 0.0–40.0)

## 2013-11-25 LAB — URIC ACID: URIC ACID, SERUM: 6.9 mg/dL (ref 4.0–7.8)

## 2013-11-25 LAB — CK: Total CK: 97 U/L (ref 7–232)

## 2013-11-25 MED ORDER — ATORVASTATIN CALCIUM 20 MG PO TABS: 20.0000 mg | ORAL_TABLET | Freq: Every day | ORAL | Status: DC

## 2013-11-25 MED ORDER — LOSARTAN POTASSIUM 100 MG PO TABS: 100.0000 mg | ORAL_TABLET | Freq: Every day | ORAL | Status: DC

## 2013-11-25 NOTE — Telephone Encounter (Signed)
Scripts sent to [facility].

## 2013-11-26 ENCOUNTER — Telehealth: Payer: Self-pay | Admitting: Nurse Practitioner

## 2013-11-26 NOTE — Telephone Encounter (Signed)
Relevant patient education assigned to patient using Emmi. ° °

## 2013-12-02 ENCOUNTER — Encounter: Payer: Self-pay | Admitting: Nurse Practitioner

## 2013-12-02 ENCOUNTER — Ambulatory Visit (INDEPENDENT_AMBULATORY_CARE_PROVIDER_SITE_OTHER): Payer: 59

## 2013-12-02 ENCOUNTER — Ambulatory Visit (INDEPENDENT_AMBULATORY_CARE_PROVIDER_SITE_OTHER): Payer: 59 | Admitting: Nurse Practitioner

## 2013-12-02 ENCOUNTER — Telehealth: Payer: Self-pay | Admitting: Nurse Practitioner

## 2013-12-02 VITALS — BP 134/74 | HR 84 | Temp 97.8°F | Ht 75.5 in | Wt 289.0 lb

## 2013-12-02 DIAGNOSIS — J8410 Pulmonary fibrosis, unspecified: Secondary | ICD-10-CM

## 2013-12-02 DIAGNOSIS — R209 Unspecified disturbances of skin sensation: Secondary | ICD-10-CM

## 2013-12-02 DIAGNOSIS — R609 Edema, unspecified: Secondary | ICD-10-CM

## 2013-12-02 DIAGNOSIS — E785 Hyperlipidemia, unspecified: Secondary | ICD-10-CM

## 2013-12-02 DIAGNOSIS — R202 Paresthesia of skin: Secondary | ICD-10-CM | POA: Insufficient documentation

## 2013-12-02 DIAGNOSIS — R600 Localized edema: Secondary | ICD-10-CM

## 2013-12-02 DIAGNOSIS — I10 Essential (primary) hypertension: Secondary | ICD-10-CM

## 2013-12-02 DIAGNOSIS — R21 Rash and other nonspecific skin eruption: Secondary | ICD-10-CM

## 2013-12-02 DIAGNOSIS — M722 Plantar fascial fibromatosis: Secondary | ICD-10-CM

## 2013-12-02 LAB — VITAMIN B12: VITAMIN B 12: 191 pg/mL — AB (ref 211–911)

## 2013-12-02 LAB — MICROALBUMIN / CREATININE URINE RATIO
CREATININE, U: 123.3 mg/dL
MICROALB UR: 5.6 mg/dL — AB (ref 0.0–1.9)
MICROALB/CREAT RATIO: 4.5 mg/g (ref 0.0–30.0)

## 2013-12-02 LAB — BRAIN NATRIURETIC PEPTIDE: Pro B Natriuretic peptide (BNP): 14 pg/mL (ref 0.0–100.0)

## 2013-12-02 MED ORDER — DICLOFENAC SODIUM 1 % TD GEL: TRANSDERMAL | Status: DC

## 2013-12-02 NOTE — Assessment & Plan Note (Addendum)
Continue cozaar 100 mg qd Bilat LE edema. Allergy to HCTZ. GFR 56. Not exercising-sedentary. Overweight. Diet changes: read Eat to Live. Walk 30 minutes daily Continue cozaar. Urine microalbumin.

## 2013-12-02 NOTE — Assessment & Plan Note (Signed)
Pain at R heel & L "ball of foot".  New boots help. Inserts have helped, but still avoiding exercise due to foot pain.  Recommend avoid NSAIDS due to GFR of 56. Daily stretches. Voltaren gel.

## 2013-12-02 NOTE — Assessment & Plan Note (Signed)
DD: vit D def, B12 def, radicular back pain, tissue ischemia from edema Labs: vit B12, vit D

## 2013-12-02 NOTE — Progress Notes (Signed)
Pre visit review using our clinic review tool, if applicable. No additional management support is needed unless otherwise documented below in the visit note. 

## 2013-12-02 NOTE — Assessment & Plan Note (Addendum)
Pitting from feet to knees. ECG NSR. CXR today. Labs: BNP Sedentary. DD: periph vascular, heart failure Daily walk. F/u 1 mo.

## 2013-12-02 NOTE — Progress Notes (Signed)
Subjective:     Javier Casey is a 58 y.o. male who presents for CPE. Considerable time spent discussing recent labs and causes & treatments for plantar fascitis as pain prevents him from exercise. His diet is fair-eats large salad daily at lunch, skips breakfast, eats pasta, bread vegetables, little meat at dinner. Does not drink sugary drinks. He is obese with BMI at 35. He is concerned about paresthesia bottom of both feet (separate from plantar fascitis pain) & rash on back of L leg. He denies cough, SOB, palpitations, constipation or diarrhea. The following portions of the patient's history were reviewed and updated as appropriate: allergies, current medications, past family history, past medical history, past social history, past surgical history and problem list.  Review of Systems Pertinent items are noted in HPI.    Objective:    BP 134/74  Pulse 84  Temp(Src) 97.8 F (36.6 C) (Temporal)  Ht 6' 3.5" (1.918 m)  Wt 289 lb (131.09 kg)  BMI 35.63 kg/m2  SpO2 97% BP 134/74  Pulse 84  Temp(Src) 97.8 F (36.6 C) (Temporal)  Ht 6' 3.5" (1.918 m)  Wt 289 lb (131.09 kg)  BMI 35.63 kg/m2  SpO2 97% General appearance: alert, cooperative, appears stated age and no distress Head: Normocephalic, without obvious abnormality, atraumatic Eyes: negative findings: lids and lashes normal, conjunctivae and sclerae normal and papule inner canthus L eye Ears: normal TM's and external ear canals both ears Throat: lips, mucosa, and tongue normal; teeth and gums normal Lungs: clear to auscultation bilaterally Heart: regular rate and rhythm, S1, S2 normal, no murmur, click, rub or gallop Extremities: edema +2 pitting edema from feet to knees, bilat. Pulses: 2+ and symmetric Skin: pink scale discrete lesion back of l leg & pink non-scale ovoid lesion L forearm. looks like ringworm or discoid lupus.    Assessment:     1. Bilateral lower extremity edema   2. Plantar fasciitis, bilateral   3.  Paresthesia of both feet   4. Essential hypertension, benign   5. HYPERLIPIDEMIA   6. Rash back of leg & forearm   Plan:   See problem list for complete A&P

## 2013-12-02 NOTE — Assessment & Plan Note (Signed)
2 cm irreg shaped scaley pink/rust colored lesion w/central clearing. Pink non-scaley lesion L forearm DD: ringworm, discoid lupus, sarcoid lesion. Labs: ACE, CXR

## 2013-12-02 NOTE — Patient Instructions (Addendum)
Start foot stretches several times daily, especially in the morning & after prolonged sitting. Hold positions for 10 seconds, repeat 5 times, repeat at least 3 times daily. Rub voltaren gel on bottom of foot up to 3 times daily as needed. I am concerned about lower leg swelling. Please get chest xray to evaluate size of heart. EKG looks good. Swelling may be due to poor vascular return due to prolonged sitting. Start walking daily: goal is 2, 15 minute walks. Consider bicycle if feet are painful.  Consider reading Eat to Live by Excell Seltzer and begin implementing principles for best health.  My office will call with results. Follow up in 1 month to evaluate leg swelling.  Plantar Fasciitis (Heel Spur Syndrome) with Rehab The plantar fascia is a fibrous, ligament-like, soft-tissue structure that spans the bottom of the foot. Plantar fasciitis is a condition that causes pain in the foot due to inflammation of the tissue. SYMPTOMS   Pain and tenderness on the underneath side of the foot.  Pain that worsens with standing or walking. CAUSES  Plantar fasciitis is caused by irritation and injury to the plantar fascia on the underneath side of the foot. Common mechanisms of injury include:  Direct trauma to bottom of the foot.  Damage to a small nerve that runs under the foot where the main fascia attaches to the heel bone.  Stress placed on the plantar fascia due to bone spurs. RISK INCREASES WITH:   Activities that place stress on the plantar fascia (running, jumping, pivoting, or cutting).  Poor strength and flexibility.  Improperly fitted shoes.  Tight calf muscles.  Flat feet.  Failure to warm-up properly before activity.  Obesity. PREVENTION  Warm up and stretch properly before activity.  Allow for adequate recovery between workouts.  Maintain physical fitness:  Strength, flexibility, and endurance.  Cardiovascular fitness.  Maintain a health body weight.  Avoid  stress on the plantar fascia.  Wear properly fitted shoes, including arch supports for individuals who have flat feet. PROGNOSIS  If treated properly, then the symptoms of plantar fasciitis usually resolve without surgery. However, occasionally surgery is necessary. RELATED COMPLICATIONS   Recurrent symptoms that may result in a chronic condition.  Problems of the lower back that are caused by compensating for the injury, such as limping.  Pain or weakness of the foot during push-off following surgery.  Chronic inflammation, scarring, and partial or complete fascia tear, occurring more often from repeated injections. TREATMENT  Treatment initially involves the use of ice and medication to help reduce pain and inflammation. The use of strengthening and stretching exercises may help reduce pain with activity, especially stretches of the Achilles tendon. These exercises may be performed at home or with a therapist. Your caregiver may recommend that you use heel cups of arch supports to help reduce stress on the plantar fascia. Occasionally, corticosteroid injections are given to reduce inflammation. If symptoms persist for greater than 6 months despite non-surgical (conservative), then surgery may be recommended.  MEDICATION   If pain medication is necessary, then nonsteroidal anti-inflammatory medications, such as aspirin and ibuprofen, or other minor pain relievers, such as acetaminophen, are often recommended.  Do not take pain medication within 7 days before surgery.  Prescription pain relievers may be given if deemed necessary by your caregiver. Use only as directed and only as much as you need.  Corticosteroid injections may be given by your caregiver. These injections should be reserved for the most serious cases, because they may  only be given a certain number of times. HEAT AND COLD  Cold treatment (icing) relieves pain and reduces inflammation. Cold treatment should be applied for  10 to 15 minutes every 2 to 3 hours for inflammation and pain and immediately after any activity that aggravates your symptoms. Use ice packs or massage the area with a piece of ice (ice massage).  Heat treatment may be used prior to performing the stretching and strengthening activities prescribed by your caregiver, physical therapist, or athletic trainer. Use a heat pack or soak the injury in warm water. SEEK IMMEDIATE MEDICAL CARE IF:  Treatment seems to offer no benefit, or the condition worsens.  Any medications produce adverse side effects. EXERCISES RANGE OF MOTION (ROM) AND STRETCHING EXERCISES - Plantar Fasciitis (Heel Spur Syndrome) These exercises may help you when beginning to rehabilitate your injury. Your symptoms may resolve with or without further involvement from your physician, physical therapist or athletic trainer. While completing these exercises, remember:   Restoring tissue flexibility helps normal motion to return to the joints. This allows healthier, less painful movement and activity.  An effective stretch should be held for at least 30 seconds.  A stretch should never be painful. You should only feel a gentle lengthening or release in the stretched tissue. RANGE OF MOTION - Toe Extension, Flexion  Sit with your right / left leg crossed over your opposite knee.  Grasp your toes and gently pull them back toward the top of your foot. You should feel a stretch on the bottom of your toes and/or foot.  Hold this stretch for __________ seconds.  Now, gently pull your toes toward the bottom of your foot. You should feel a stretch on the top of your toes and or foot.  Hold this stretch for __________ seconds. Repeat __________ times. Complete this stretch __________ times per day.  RANGE OF MOTION - Ankle Dorsiflexion, Active Assisted  Remove shoes and sit on a chair that is preferably not on a carpeted surface.  Place right / left foot under knee. Extend your  opposite leg for support.  Keeping your heel down, slide your right / left foot back toward the chair until you feel a stretch at your ankle or calf. If you do not feel a stretch, slide your bottom forward to the edge of the chair, while still keeping your heel down.  Hold this stretch for __________ seconds. Repeat __________ times. Complete this stretch __________ times per day.  STRETCH  Gastroc, Standing  Place hands on wall.  Extend right / left leg, keeping the front knee somewhat bent.  Slightly point your toes inward on your back foot.  Keeping your right / left heel on the floor and your knee straight, shift your weight toward the wall, not allowing your back to arch.  You should feel a gentle stretch in the right / left calf. Hold this position for __________ seconds. Repeat __________ times. Complete this stretch __________ times per day. STRETCH  Soleus, Standing  Place hands on wall.  Extend right / left leg, keeping the other knee somewhat bent.  Slightly point your toes inward on your back foot.  Keep your right / left heel on the floor, bend your back knee, and slightly shift your weight over the back leg so that you feel a gentle stretch deep in your back calf.  Hold this position for __________ seconds. Repeat __________ times. Complete this stretch __________ times per day. STRETCH  Gastrocsoleus, Standing  Note: This  exercise can place a lot of stress on your foot and ankle. Please complete this exercise only if specifically instructed by your caregiver.   Place the ball of your right / left foot on a step, keeping your other foot firmly on the same step.  Hold on to the wall or a rail for balance.  Slowly lift your other foot, allowing your body weight to press your heel down over the edge of the step.  You should feel a stretch in your right / left calf.  Hold this position for __________ seconds.  Repeat this exercise with a slight bend in your right  / left knee. Repeat __________ times. Complete this stretch __________ times per day.  STRENGTHENING EXERCISES - Plantar Fasciitis (Heel Spur Syndrome)  These exercises may help you when beginning to rehabilitate your injury. They may resolve your symptoms with or without further involvement from your physician, physical therapist or athletic trainer. While completing these exercises, remember:   Muscles can gain both the endurance and the strength needed for everyday activities through controlled exercises.  Complete these exercises as instructed by your physician, physical therapist or athletic trainer. Progress the resistance and repetitions only as guided. STRENGTH - Towel Curls  Sit in a chair positioned on a non-carpeted surface.  Place your foot on a towel, keeping your heel on the floor.  Pull the towel toward your heel by only curling your toes. Keep your heel on the floor.  If instructed by your physician, physical therapist or athletic trainer, add ____________________ at the end of the towel. Repeat __________ times. Complete this exercise __________ times per day. STRENGTH - Ankle Inversion  Secure one end of a rubber exercise band/tubing to a fixed object (table, pole). Loop the other end around your foot just before your toes.  Place your fists between your knees. This will focus your strengthening at your ankle.  Slowly, pull your big toe up and in, making sure the band/tubing is positioned to resist the entire motion.  Hold this position for __________ seconds.  Have your muscles resist the band/tubing as it slowly pulls your foot back to the starting position. Repeat __________ times. Complete this exercises __________ times per day.  Document Released: 07/03/2005 Document Revised: 09/25/2011 Document Reviewed: 10/15/2008 [facility] Patient Information 2014 [place], [place].  Peripheral Edema You have swelling in your legs (peripheral edema). This swelling is due to  excess accumulation of salt and water in your body. Edema may be a sign of heart, kidney or liver disease, or a side effect of a medication. It may also be due to problems in the leg veins. Elevating your legs and using special support stockings may be very helpful, if the cause of the swelling is due to poor venous circulation. Avoid long periods of standing, whatever the cause. Treatment of edema depends on identifying the cause. Chips, pretzels, pickles and other salty foods should be avoided. Restricting salt in your diet is almost always needed. Water pills (diuretics) are often used to remove the excess salt and water from your body via urine. These medicines prevent the kidney from reabsorbing sodium. This increases urine flow. Diuretic treatment may also result in lowering of potassium levels in your body. Potassium supplements may be needed if you have to use diuretics daily. Daily weights can help you keep track of your progress in clearing your edema. You should call your caregiver for follow up care as recommended. SEEK IMMEDIATE MEDICAL CARE IF:   You have increased  swelling, pain, redness, or heat in your legs.  You develop shortness of breath, especially when lying down.  You develop chest or abdominal pain, weakness, or fainting.  You have a fever. Document Released: 08/10/2004 Document Revised: 09/25/2011 Document Reviewed: 07/21/2009 [facility] Patient Information 2014 [place].

## 2013-12-02 NOTE — Assessment & Plan Note (Signed)
Diet changes: Less refined white flour & sugar, more plant foods. Read Eat to Live by Excell Seltzer. Continue lipitor. Daily walk to increase hdl.

## 2013-12-02 NOTE — Telephone Encounter (Signed)
pls call pt: Advise No obvious heart enlargement. Calcified granuloma of RUL still there. Will call when blood work results.

## 2013-12-03 LAB — VITAMIN D 25 HYDROXY (VIT D DEFICIENCY, FRACTURES): Vit D, 25-Hydroxy: 19 ng/mL — ABNORMAL LOW (ref 30–89)

## 2013-12-03 NOTE — Telephone Encounter (Signed)
pls call pt: Advise Great news: heart enzyme is nml and CXR shows nml size heart, so LE swelling does not appear to be due to heart failure. More likely related to prolonged sitting & perhaps too much salt, too little water. Limit salt to 2400 grams daily. 2, 15 minute walks daily. Get up several times at work & walk for 2-3 minutes. Vitamin B12 & D are extremely low: Start B12 injections weekly times 4 weeks, then start oral supplement-I will discuss at 1 mo. OV. Start D3 5000 iu qd for 3 mos, then 2000 iu qd.

## 2013-12-03 NOTE — Telephone Encounter (Signed)
ACE added. Form sent to [facility] with pt about lab results. Pt understood. He will get his first Vit b12 injection tomorrow at 130pm

## 2013-12-04 ENCOUNTER — Ambulatory Visit (INDEPENDENT_AMBULATORY_CARE_PROVIDER_SITE_OTHER): Payer: 59 | Admitting: *Deleted

## 2013-12-04 DIAGNOSIS — E538 Deficiency of other specified B group vitamins: Secondary | ICD-10-CM

## 2013-12-04 LAB — ANGIOTENSIN CONVERTING ENZYME: ANGIOTENSIN 1 CE: 28

## 2013-12-04 MED ORDER — CYANOCOBALAMIN 1000 MCG/ML IJ SOLN
1000.0000 ug | Freq: Once | INTRAMUSCULAR | Status: AC
  Administered 2013-12-04: 1000 ug via INTRAMUSCULAR

## 2013-12-11 ENCOUNTER — Other Ambulatory Visit: Payer: Self-pay

## 2013-12-11 ENCOUNTER — Other Ambulatory Visit: Payer: 59

## 2013-12-11 ENCOUNTER — Ambulatory Visit: Payer: 59

## 2013-12-11 DIAGNOSIS — Z0000 Encounter for general adult medical examination without abnormal findings: Secondary | ICD-10-CM

## 2013-12-11 DIAGNOSIS — E538 Deficiency of other specified B group vitamins: Secondary | ICD-10-CM

## 2013-12-11 LAB — HEMOCCULT SLIDES (X 3 CARDS)
FECAL OCCULT BLD: NEGATIVE
OCCULT 1: NEGATIVE
OCCULT 2: NEGATIVE
OCCULT 3: NEGATIVE
OCCULT 4: NEGATIVE
OCCULT 5: NEGATIVE

## 2013-12-11 MED ORDER — CYANOCOBALAMIN 1000 MCG/ML IJ SOLN
1000.0000 ug | Freq: Once | INTRAMUSCULAR | Status: AC
  Administered 2013-12-11: 1000 ug via INTRAMUSCULAR

## 2013-12-18 ENCOUNTER — Ambulatory Visit (INDEPENDENT_AMBULATORY_CARE_PROVIDER_SITE_OTHER): Payer: 59

## 2013-12-18 DIAGNOSIS — E538 Deficiency of other specified B group vitamins: Secondary | ICD-10-CM

## 2013-12-18 MED ORDER — CYANOCOBALAMIN 1000 MCG/ML IJ SOLN
1000.0000 ug | Freq: Once | INTRAMUSCULAR | Status: AC
  Administered 2013-12-18: 1000 ug via INTRAMUSCULAR

## 2013-12-18 NOTE — Progress Notes (Signed)
   Subjective:    Patient ID: Javier Casey, male    DOB: 01-12-1956, 58 y.o.   MRN: [id number]  HPI    Review of Systems     Objective:   Physical Exam        Assessment & Plan:   Vit B12 given

## 2013-12-22 ENCOUNTER — Encounter: Payer: Self-pay | Admitting: Nurse Practitioner

## 2013-12-25 ENCOUNTER — Ambulatory Visit: Payer: 59

## 2013-12-25 DIAGNOSIS — E538 Deficiency of other specified B group vitamins: Secondary | ICD-10-CM

## 2013-12-25 MED ORDER — CYANOCOBALAMIN 1000 MCG/ML IJ SOLN
1000.0000 ug | Freq: Once | INTRAMUSCULAR | Status: AC
  Administered 2013-12-25: 1000 ug via INTRAMUSCULAR

## 2013-12-30 ENCOUNTER — Ambulatory Visit: Payer: 59 | Admitting: Nurse Practitioner

## 2014-01-12 ENCOUNTER — Encounter: Payer: Self-pay | Admitting: Nurse Practitioner

## 2014-01-12 ENCOUNTER — Ambulatory Visit (INDEPENDENT_AMBULATORY_CARE_PROVIDER_SITE_OTHER): Payer: 59 | Admitting: Nurse Practitioner

## 2014-01-12 VITALS — BP 114/68 | HR 75 | Temp 98.2°F | Resp 18 | Ht 75.5 in | Wt 280.0 lb

## 2014-01-12 DIAGNOSIS — R209 Unspecified disturbances of skin sensation: Secondary | ICD-10-CM

## 2014-01-12 DIAGNOSIS — R21 Rash and other nonspecific skin eruption: Secondary | ICD-10-CM

## 2014-01-12 DIAGNOSIS — R600 Localized edema: Secondary | ICD-10-CM

## 2014-01-12 DIAGNOSIS — I10 Essential (primary) hypertension: Secondary | ICD-10-CM

## 2014-01-12 DIAGNOSIS — R252 Cramp and spasm: Secondary | ICD-10-CM | POA: Insufficient documentation

## 2014-01-12 DIAGNOSIS — E538 Deficiency of other specified B group vitamins: Secondary | ICD-10-CM | POA: Insufficient documentation

## 2014-01-12 DIAGNOSIS — M722 Plantar fascial fibromatosis: Secondary | ICD-10-CM

## 2014-01-12 DIAGNOSIS — R202 Paresthesia of skin: Secondary | ICD-10-CM

## 2014-01-12 DIAGNOSIS — E785 Hyperlipidemia, unspecified: Secondary | ICD-10-CM

## 2014-01-12 DIAGNOSIS — R609 Edema, unspecified: Secondary | ICD-10-CM

## 2014-01-12 DIAGNOSIS — J8410 Pulmonary fibrosis, unspecified: Secondary | ICD-10-CM

## 2014-01-12 DIAGNOSIS — R944 Abnormal results of kidney function studies: Secondary | ICD-10-CM

## 2014-01-12 LAB — URINALYSIS, MICROSCOPIC ONLY

## 2014-01-12 LAB — VITAMIN B12: Vitamin B-12: >1500 pg/mL — ABNORMAL HIGH (ref 211–911)

## 2014-01-12 LAB — MAGNESIUM: MAGNESIUM: 2.1 mg/dL (ref 1.5–2.5)

## 2014-01-12 MED ORDER — ATORVASTATIN CALCIUM 20 MG PO TABS: 20.0000 mg | ORAL_TABLET | Freq: Every day | ORAL | Status: DC

## 2014-01-12 MED ORDER — LOSARTAN POTASSIUM 50 MG PO TABS: 50.0000 mg | ORAL_TABLET | Freq: Every day | ORAL | Status: DC

## 2014-01-12 NOTE — Assessment & Plan Note (Signed)
Check Mg level. No PPI, takes H2 blocker.

## 2014-01-12 NOTE — Assessment & Plan Note (Signed)
Pt stopped statin 2 weeks ago due to "things he is reading". Also having muscle cramps that are persistent, but better since stopped med. Recmd he continue med until BMI is closer to nml. Continue lifestyle interventions-cut out refined flour & sugar. Check lipids in 2 mos.

## 2014-01-12 NOTE — Progress Notes (Signed)
Subjective:     Javier Casey is a 58 y.o. male presents for follow up of b12 & Vitamin D deficicency, plantar fascitis, other parasthesia of LE, LE edema, HTN, dyslipidemia. Re B12 deficiency, he is not anemic. He had 4 weekly injections of B12, now started oral B12. Feel like he has more energy, parasthesias of LE improved. Still c/o muscle achiness. He has started D3 supplement w/no reported SE. Re plantar fascitis, stretches are helping, pain is persistent. Re LE edema, it is persistent, he is candid that he is not walking daily. Cardiac w/u neg (CXR, BNP, ECG). Kidneys? He hAS elevated microalbumin & decreased GFR. Prolonged sitting is likely a major contributor-sits at computer 10-12 hrs daily. Re HTN, he has lost 9 lbs. Since last visit 6 weeks ago. BP hOme readings have been under 120/80. Pt was advised to take 1/2 tab cozaar via email. BP has continued to be well controlled. Re dyslipidemia: pt stopped statin 2 weeks ago due to things he was reading. Advised studies are strong that statin is likely to decrease mortality as long as weight is not normal. If he can make lifestyle changes that normalize weight, he may consider stopping med.  The following portions of the patient's history were reviewed and updated as appropriate: allergies, current medications, past family history, past medical history, past social history, past surgical history and problem list.  Review of Systems Pertinent items are noted in HPI.    Objective:    BP 114/68  Pulse 75  Temp(Src) 98.2 F (36.8 C) (Oral)  Resp 18  Ht 6' 3.5" (1.918 m)  Wt 280 lb (127.007 kg)  BMI 34.52 kg/m2  SpO2 96% BP 114/68  Pulse 75  Temp(Src) 98.2 F (36.8 C) (Oral)  Resp 18  Ht 6' 3.5" (1.918 m)  Wt 280 lb (127.007 kg)  BMI 34.52 kg/m2  SpO2 96% General appearance: alert, cooperative, appears stated age, no distress and good color in cheeks, best I've seen him. Very pale at last visit. Head: Normocephalic, without  obvious abnormality, atraumatic Eyes: negative findings: lids and lashes normal and conjunctivae and sclerae normal Lungs: clear to auscultation bilaterally Heart: regular rate and rhythm, S1, S2 normal, no murmur, click, rub or gallop Extremities: edema +3 pitting from ankles to knees, bilat.    Assessment:     1. HYPERTENSION, BENIGN ESSENTIAL - losartan (COZAAR) 50 MG tablet; Take 1 tablet (50 mg total) by mouth daily.  Dispense: 30 tablet; Refill: 2 - Urine Microscopic  2. Muscle cramp, nocturnal - Magnesium  3. Vitamin B12 deficiency - Vitamin B12  4. Decreased GFR - Urine Microscopic  5. HYPERLIPIDEMIA - atorvastatin (LIPITOR) 20 MG tablet; Take 1 tablet (20 mg total) by mouth daily.  Dispense: 30 tablet; Refill: 2  6. Plantar fasciitis of right foot  8. Rash  9. Bilateral lower extremity edema  10. Paresthesia of both feet  See problem list for complete A&P See pt instructions. F/u 2 mos.

## 2014-01-12 NOTE — Assessment & Plan Note (Signed)
Pt decreased cozaar to 50 mg qd, as home BP readings have been less than 120/80. Overall feels well. GFR 56, microalbumin is elevated. Will check urine micro for blood/protein. If pos, will refer to neph for further eval. Still has significant LE edema. Prolonged Sitting?, decreased kidney function? Not Heart. SE from cozaar?  Recommend exercise trial -20 minutes daily. If does not improve LE edema, will change to chlorthalidone.

## 2014-01-12 NOTE — Progress Notes (Signed)
Pre visit review using our clinic review tool, if applicable. No additional management support is needed unless otherwise documented below in the visit note. 

## 2014-01-12 NOTE — Patient Instructions (Addendum)
My office will call with lab results.  Get 1200 mcg B12 daily.  Get 5000 iu D3 daily. Please try to walk for minimum of 20 minutes daily. If it does not improve leg edema, we will consider changing blood pressure medicine.  Great job with diet changes! Continue! See you in 2 mos.   Exercise to Stay Healthy Exercise helps you become and stay healthy. EXERCISE IDEAS AND TIPS Choose exercises that:  You enjoy.  Fit into your day. You do not need to exercise really hard to be healthy. You can do exercises at a slow or medium level and stay healthy. You can:  Stretch before and after working out.  Try yoga, Pilates, or tai chi.  Lift weights.  Walk fast, swim, jog, run, climb stairs, bicycle, dance, or rollerskate.  Take aerobic classes. Exercises that burn about 150 calories:  Running 1  miles in 15 minutes.  Playing volleyball for 45 to 60 minutes.  Washing and waxing a car for 45 to 60 minutes.  Playing touch football for 45 minutes.  Walking 1  miles in 35 minutes.  Pushing a stroller 1  miles in 30 minutes.  Playing basketball for 30 minutes.  Raking leaves for 30 minutes.  Bicycling 5 miles in 30 minutes.  Walking 2 miles in 30 minutes.  Dancing for 30 minutes.  Shoveling snow for 15 minutes.  Swimming laps for 20 minutes.  Walking up stairs for 15 minutes.  Bicycling 4 miles in 15 minutes.  Gardening for 30 to 45 minutes.  Jumping rope for 15 minutes.  Washing windows or floors for 45 to 60 minutes. Document Released: 08/05/2010 Document Revised: 09/25/2011 Document Reviewed: 08/05/2010 [facility] Patient Information 2015 [place], [place]. This information is not intended to replace advice given to you by your health care provider. Make sure you discuss any questions you have with your health care provider.

## 2014-01-12 NOTE — Assessment & Plan Note (Signed)
Better w/stretches. Still having some discomfort.

## 2014-01-13 ENCOUNTER — Telehealth: Payer: Self-pay | Admitting: Nurse Practitioner

## 2014-01-13 NOTE — Telephone Encounter (Signed)
pls call pt: Advise B12 is therapeutic. He can slow down on B12. OK to take current dose 3 times/week. Magnesium level is normal, no need to supplement.

## 2014-01-13 NOTE — Telephone Encounter (Signed)
Relevant patient education assigned to patient using Emmi. ° °

## 2014-01-14 NOTE — Telephone Encounter (Signed)
Spoke with pt, advised lab results. Pt understood. 

## 2014-01-21 ENCOUNTER — Encounter: Payer: Self-pay | Admitting: *Deleted

## 2014-02-11 ENCOUNTER — Encounter: Payer: Self-pay | Admitting: Nurse Practitioner

## 2014-03-16 ENCOUNTER — Encounter: Payer: Self-pay | Admitting: Nurse Practitioner

## 2014-03-16 ENCOUNTER — Ambulatory Visit (INDEPENDENT_AMBULATORY_CARE_PROVIDER_SITE_OTHER): Payer: 59 | Admitting: Nurse Practitioner

## 2014-03-16 VITALS — BP 144/83 | HR 71 | Temp 98.6°F | Resp 18 | Ht 73.5 in | Wt 286.0 lb

## 2014-03-16 DIAGNOSIS — I10 Essential (primary) hypertension: Secondary | ICD-10-CM

## 2014-03-16 DIAGNOSIS — E785 Hyperlipidemia, unspecified: Secondary | ICD-10-CM

## 2014-03-16 DIAGNOSIS — E559 Vitamin D deficiency, unspecified: Secondary | ICD-10-CM

## 2014-03-16 DIAGNOSIS — R600 Localized edema: Secondary | ICD-10-CM

## 2014-03-16 DIAGNOSIS — R252 Cramp and spasm: Secondary | ICD-10-CM

## 2014-03-16 DIAGNOSIS — M791 Myalgia, unspecified site: Secondary | ICD-10-CM

## 2014-03-16 DIAGNOSIS — R609 Edema, unspecified: Secondary | ICD-10-CM

## 2014-03-16 DIAGNOSIS — J984 Other disorders of lung: Secondary | ICD-10-CM

## 2014-03-16 DIAGNOSIS — IMO0001 Reserved for inherently not codable concepts without codable children: Secondary | ICD-10-CM

## 2014-03-16 DIAGNOSIS — J8410 Pulmonary fibrosis, unspecified: Secondary | ICD-10-CM

## 2014-03-16 MED ORDER — LISINOPRIL 20 MG PO TABS: 20.0000 mg | ORAL_TABLET | Freq: Every day | ORAL | Status: DC

## 2014-03-16 NOTE — Assessment & Plan Note (Signed)
Continues on lipitor. Triglycerides elevated Cut refined sugars & flours. Increase exercise. Not fasting today, will ret for lab appt. Only.

## 2014-03-16 NOTE — Assessment & Plan Note (Addendum)
C/o deltoid pain bilat for 3 wks [employer], resolved yesterday. 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 Not interested in muscle biopsy. DD: polymyositis, fibromyalgia CK nml. Pt not interested in medication, wants to try to increase exercise.

## 2014-03-16 NOTE — Assessment & Plan Note (Signed)
Start lisinopril D/c losartan. Salt restriction Daily walk Increase water intake. F/u 1 mo.

## 2014-03-16 NOTE — Patient Instructions (Signed)
I am concerned about leg edema.  Losartan can cause edema, so switch to lisinopril. Check blood pressure at home same time each day. Call if over 140/90 or under 90/60.  Increase exercise-daily 20 minute walk.  Restrict salt to 2400 mg daily. Increase water intake-that helps flush out sodium, which helps get rid of extra fluid.  Return for fasting labs.  See you in 1 month after starting new blood pressure medicine.

## 2014-03-16 NOTE — Progress Notes (Signed)
Subjective:     Javier Casey is a 58 y.o. male who returns for f/u of elevated bp, LE edema, hyperlipidemia. He also c/o myalgia for last 3 weeks-mostly in bilat deltoid. Pain self-resolving. Feels better today.Has occurred in past, occasionally keeps from engaging in activity but notices if he pushes through, he feels better. 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 He is not interested in muscle biopsy. Re Htn, he is compliant with meds, BP well controlled at home, elevated in ofc. Does not exercise, recent 6 lb. Weight gain. Persistent LE edema-possibly SE of losartan. Discussed changing to lisinopril. He has history of elevated uric acid, so will not give thiazide diuretic. Re LE edema, he sits most all day. Chest xray-nml sx heart, BNP nml, will change bp med to eliminate med SE as cause. GFR in 50's, nml BUN & creat. Re hyperlipidemia, triglycerides are elevated. Discussed eliminating refined sugar & flour-pt states he doesn't eat much of those foods. Tolerating lipitor well. Not fasting today, will return this week for labs.  The following portions of the patient's history were reviewed and updated as appropriate: allergies, current medications, past family history, past medical history, past social history, past surgical history and problem list.  Review of Systems Pertinent items are noted in HPI.    Objective:    BP 144/83  Pulse 71  Temp(Src) 98.6 F (37 C) (Temporal)  Resp 18  Ht 6' 1.5" (1.867 m)  Wt 286 lb (129.729 kg)  BMI 37.22 kg/m2  SpO2 95% BP 144/83  Pulse 71  Temp(Src) 98.6 F (37 C) (Temporal)  Resp 18  Ht 6' 1.5" (1.867 m)  Wt 286 lb (129.729 kg)  BMI 37.22 kg/m2  SpO2 95% General appearance: alert, cooperative, appears stated age, no distress and moderately obese Head: Normocephalic, without obvious abnormality, atraumatic Eyes: negative findings: lids and lashes normal and conjunctivae and sclerae normal Lungs: clear to auscultation  bilaterally Heart: regular rate and rhythm, S1, S2 normal, no murmur, click, rub or gallop Extremities: edema pitting edema to knees bilat, +2 Pulses: 2+ and symmetric Skin: Skin color, texture, turgor normal. No rashes or lesions or good color in face, usually very pale.    Assessment:     1. Essential hypertension, benign - lisinopril (PRINIVIL,ZESTRIL) 20 MG tablet; Take 1 tablet (20 mg total) by mouth daily.  Dispense: 30 tablet; Refill: 1  2. Other and unspecified hyperlipidemia - Lipid panel; Future  3. Calcified granuloma of lung - Quantiferon tb gold assay; Future  4. Bilateral edema of lower extremity  5. Bilateral lower extremity edema  6. Unspecified vitamin D deficiency - Vit D  25 hydroxy (rtn osteoporosis monitoring); Future  7. Muscle cramp, nocturnal  8. Myalgia  See problem list for complete A&P See pt instructions. F/u 1 mo.

## 2014-03-16 NOTE — Assessment & Plan Note (Signed)
Current med: losartan 50 mg qd.  Pt reports always lower than 120/86 at home. Elevated in ofc. Gained 6 lb in 2 mos. Chronic LE edema-SE of cozaar? Encourage daily walk Salt restriction. Change to lisinopril to see if edema gets better. F/i 1 mo.

## 2014-03-16 NOTE — Assessment & Plan Note (Signed)
Will add TB gold w/next lab draw. No [employer], Never lived in [place]. Likely scar from pna.

## 2014-03-16 NOTE — Progress Notes (Signed)
Pre visit review using our clinic review tool, if applicable. No additional management support is needed unless otherwise documented below in the visit note. 

## 2014-03-19 ENCOUNTER — Other Ambulatory Visit (INDEPENDENT_AMBULATORY_CARE_PROVIDER_SITE_OTHER): Payer: 59

## 2014-03-19 DIAGNOSIS — J984 Other disorders of lung: Secondary | ICD-10-CM

## 2014-03-19 DIAGNOSIS — E559 Vitamin D deficiency, unspecified: Secondary | ICD-10-CM

## 2014-03-19 DIAGNOSIS — E785 Hyperlipidemia, unspecified: Secondary | ICD-10-CM

## 2014-03-19 DIAGNOSIS — J8410 Pulmonary fibrosis, unspecified: Secondary | ICD-10-CM

## 2014-03-20 LAB — LIPID PANEL
Cholesterol: 155 mg/dL (ref 0–200)
HDL: 33.7 mg/dL — ABNORMAL LOW (ref >39.00)
LDL Cholesterol: 83 mg/dL (ref 0–99)
NONHDL: 121.3
Total CHOL/HDL Ratio: 5
Triglycerides: 193 mg/dL — ABNORMAL HIGH (ref 0.0–149.0)
VLDL: 38.6 mg/dL (ref 0.0–40.0)

## 2014-03-21 LAB — QUANTIFERON TB GOLD ASSAY (BLOOD)
Interferon Gamma Release Assay: NEGATIVE
QUANTIFERON NIL VALUE: 0.01 [IU]/mL
Quantiferon Tb Ag Minus Nil Value: 0.01 IU/mL
TB AG VALUE: 0.02 [IU]/mL

## 2014-03-24 ENCOUNTER — Telehealth: Payer: Self-pay | Admitting: Nurse Practitioner

## 2014-03-24 LAB — VITAMIN D 25 HYDROXY (VIT D DEFICIENCY, FRACTURES): VITD: 57.91 ng/mL (ref 30.00–100.00)

## 2014-03-24 NOTE — Telephone Encounter (Signed)
Spoke with pt, advised message from [place]. Pt understood. 

## 2014-03-24 NOTE — Telephone Encounter (Signed)
pls call pt: Advise TB test negative. Will check lipids again when he returns as there has been no change & he was not fasting. He needs fasting appt. At end of Sept. If it is not, please ask Javier Casey if we can reschedule for fasting.  I will need to go up on cholesterol medicine if no change in 4 weeks. Daily walk and eating 1 cup beans every day-hot or on salad will likely bring numbers down.

## 2014-04-13 ENCOUNTER — Encounter: Payer: Self-pay | Admitting: Nurse Practitioner

## 2014-04-13 ENCOUNTER — Ambulatory Visit (INDEPENDENT_AMBULATORY_CARE_PROVIDER_SITE_OTHER): Payer: 59 | Admitting: Nurse Practitioner

## 2014-04-13 VITALS — BP 118/72 | HR 79 | Temp 97.5°F | Resp 18 | Ht 73.5 in | Wt 278.0 lb

## 2014-04-13 DIAGNOSIS — R609 Edema, unspecified: Secondary | ICD-10-CM

## 2014-04-13 DIAGNOSIS — R600 Localized edema: Secondary | ICD-10-CM

## 2014-04-13 DIAGNOSIS — Z23 Encounter for immunization: Secondary | ICD-10-CM

## 2014-04-13 DIAGNOSIS — I10 Essential (primary) hypertension: Secondary | ICD-10-CM

## 2014-04-13 DIAGNOSIS — T783XXA Angioneurotic edema, initial encounter: Secondary | ICD-10-CM

## 2014-04-13 MED ORDER — PREDNISONE 10 MG PO TABS: ORAL_TABLET | ORAL | Status: DC

## 2014-04-13 NOTE — Progress Notes (Signed)
Pre visit review using our clinic review tool, if applicable. No additional management support is needed unless otherwise documented below in the visit note. 

## 2014-04-13 NOTE — Assessment & Plan Note (Signed)
BP well controlled. Elevated microalb. Dry cough w/lisinopril, but does nlt bother pt. LE edema is better but persistent-pitting halfway up leg. Will decrease to 10 MG qd.

## 2014-04-13 NOTE — Progress Notes (Signed)
Subjective:     Javier Casey is a 58 y.o. male presnets for follow up of HTN & LE edema.  I switched d/c'd losartan due to LE swelling. Started lisinopril. Pt developed dry cough-he says it doesn't bother him. BP well controlled, LE edema some better, but persistent.  Lost 8 lb by changing diet. Not exercising Still c/o foot pain in ball of foot & heel. He brought pics of past episodes of random lip swelling. Saw allergist for years w/no resolve. States yesterday his wrist & finger were swollen, no pain. Couldn't wear watch.  The following portions of the patient's history were reviewed and updated as appropriate: allergies, current medications, past medical history, past social history, past surgical history and problem list.  Review of Systems Pertinent items are noted in HPI.    Objective:    BP 118/72  Pulse 79  Temp(Src) 97.5 F (36.4 C) (Oral)  Resp 18  Ht 6' 1.5" (1.867 m)  Wt 278 lb (126.1 kg)  BMI 36.18 kg/m2  SpO2 97% BP 118/72  Pulse 79  Temp(Src) 97.5 F (36.4 C) (Oral)  Resp 18  Ht 6' 1.5" (1.867 m)  Wt 278 lb (126.1 kg)  BMI 36.18 kg/m2  SpO2 97% General appearance: alert, cooperative, appears stated age and no distress Head: Normocephalic, without obvious abnormality, atraumatic Eyes: negative findings: lids and lashes normal and conjunctivae and sclerae normal Lungs: clear to auscultation bilaterally Heart: regular rate and rhythm, S1, S2 normal, no murmur, click, rub or gallop Extremities: edema +1 pittong half way up lwr leg, bilat    Assessment:     1. Swelling - predniSONE (DELTASONE) 10 MG tablet; Take 2 T PO once PRN swelling, repeat one time in 12H if swelling persists.  Dispense: 10 tablet; Refill: 0  2. HYPERTENSION, BENIGN ESSENTIAL Decrease lisinopril to 10 mg. Pt to call if over 140/90. F/u 6 mos. 3. Bilateral lower extremity edema

## 2014-04-13 NOTE — Assessment & Plan Note (Signed)
Pt showed me pics of lower lip swelling. Said wrist & finger were swollen yesterday. ETIO? ACE & CK nml. 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 Will give PRN script for prednisone-said allergist did this & was helpful.Javier Casey

## 2014-04-13 NOTE — Patient Instructions (Signed)
Decrease BP med to 1/2 tablet. Check BP. If it goes over 140/90, resume whole tablet.  Continue B complex. Take D3 1000 iu 3 to 4 times weekly with meal.  Use prednisone if you have another episode of swelling.  Try to walk daily. Continue with good food choices for best health. See handout.  See you in March.

## 2014-04-14 ENCOUNTER — Telehealth: Payer: Self-pay

## 2014-04-14 MED ORDER — LISINOPRIL 10 MG PO TABS: 10.0000 mg | ORAL_TABLET | Freq: Every day | ORAL | Status: DC

## 2014-04-14 NOTE — Telephone Encounter (Signed)
Needs refill of lisinopril 10 mg instead of 20mg .  Was discussed during last visit that the dose would be cut in half from 20 mg to 10 mg.

## 2014-05-01 ENCOUNTER — Other Ambulatory Visit: Payer: Self-pay | Admitting: *Deleted

## 2014-05-01 DIAGNOSIS — E785 Hyperlipidemia, unspecified: Secondary | ICD-10-CM

## 2014-05-01 MED ORDER — ATORVASTATIN CALCIUM 20 MG PO TABS: 20.0000 mg | ORAL_TABLET | Freq: Every day | ORAL | Status: DC

## 2014-07-02 ENCOUNTER — Other Ambulatory Visit: Payer: Self-pay | Admitting: *Deleted

## 2014-07-02 DIAGNOSIS — R609 Edema, unspecified: Secondary | ICD-10-CM

## 2014-07-02 NOTE — Telephone Encounter (Signed)
Patient is requesting refill for prednisone. Please advise?

## 2014-07-03 MED ORDER — PREDNISONE 10 MG PO TABS: ORAL_TABLET | ORAL | Status: AC

## 2014-07-17 DIAGNOSIS — K5790 Diverticulosis of intestine, part unspecified, without perforation or abscess without bleeding: Secondary | ICD-10-CM

## 2014-07-17 HISTORY — DX: Diverticulosis of intestine, part unspecified, without perforation or abscess without bleeding: K57.90

## 2014-07-27 ENCOUNTER — Other Ambulatory Visit: Payer: Self-pay

## 2014-07-27 DIAGNOSIS — E785 Hyperlipidemia, unspecified: Secondary | ICD-10-CM

## 2014-07-27 MED ORDER — ATORVASTATIN CALCIUM 20 MG PO TABS: 20.0000 mg | ORAL_TABLET | Freq: Every day | ORAL | Status: DC

## 2014-07-27 NOTE — Telephone Encounter (Signed)
[facility] requesting refill on At[facility]vastatin.

## 2014-08-25 ENCOUNTER — Other Ambulatory Visit: Payer: Self-pay | Admitting: *Deleted

## 2014-08-25 MED ORDER — LISINOPRIL 10 MG PO TABS: 10.0000 mg | ORAL_TABLET | Freq: Every day | ORAL | Status: DC

## 2014-09-28 ENCOUNTER — Other Ambulatory Visit: Payer: Self-pay

## 2014-09-28 DIAGNOSIS — E785 Hyperlipidemia, unspecified: Secondary | ICD-10-CM

## 2014-09-28 MED ORDER — ATORVASTATIN CALCIUM 20 MG PO TABS: 20.0000 mg | ORAL_TABLET | Freq: Every day | ORAL | Status: DC

## 2014-09-28 NOTE — Telephone Encounter (Signed)
Please Advise Refill Request? Refill request for- Atorvastatin 20mg  Tab Last filled by MD on - 03/23/14 Last Appt - 04/13/14        Next Appt - 10/09/14 Pharmacy- [place]

## 2014-09-28 NOTE — Telephone Encounter (Signed)
I will give 30 tabs. He needs to have lipids checked again. I will not be in ofc 3/25. Pls let him know to reschedule appt.

## 2014-09-29 NOTE — Telephone Encounter (Signed)
Called and rescheduled patient.

## 2014-10-02 ENCOUNTER — Encounter: Payer: Self-pay | Admitting: Nurse Practitioner

## 2014-10-02 ENCOUNTER — Other Ambulatory Visit (INDEPENDENT_AMBULATORY_CARE_PROVIDER_SITE_OTHER): Payer: 59

## 2014-10-02 ENCOUNTER — Ambulatory Visit (INDEPENDENT_AMBULATORY_CARE_PROVIDER_SITE_OTHER): Payer: 59 | Admitting: Nurse Practitioner

## 2014-10-02 ENCOUNTER — Encounter: Payer: Self-pay | Admitting: Internal Medicine

## 2014-10-02 VITALS — BP 124/75 | HR 71 | Temp 98.2°F | Ht 73.5 in | Wt 287.0 lb

## 2014-10-02 DIAGNOSIS — E790 Hyperuricemia without signs of inflammatory arthritis and tophaceous disease: Secondary | ICD-10-CM

## 2014-10-02 DIAGNOSIS — R5383 Other fatigue: Secondary | ICD-10-CM

## 2014-10-02 DIAGNOSIS — E785 Hyperlipidemia, unspecified: Secondary | ICD-10-CM

## 2014-10-02 DIAGNOSIS — E559 Vitamin D deficiency, unspecified: Secondary | ICD-10-CM

## 2014-10-02 DIAGNOSIS — R7989 Other specified abnormal findings of blood chemistry: Secondary | ICD-10-CM

## 2014-10-02 DIAGNOSIS — J029 Acute pharyngitis, unspecified: Secondary | ICD-10-CM

## 2014-10-02 DIAGNOSIS — Z86010 Personal history of colonic polyps: Secondary | ICD-10-CM

## 2014-10-02 DIAGNOSIS — I10 Essential (primary) hypertension: Secondary | ICD-10-CM

## 2014-10-02 DIAGNOSIS — E538 Deficiency of other specified B group vitamins: Secondary | ICD-10-CM

## 2014-10-02 DIAGNOSIS — Z0000 Encounter for general adult medical examination without abnormal findings: Secondary | ICD-10-CM

## 2014-10-02 DIAGNOSIS — R790 Abnormal level of blood mineral: Secondary | ICD-10-CM

## 2014-10-02 LAB — COMPREHENSIVE METABOLIC PANEL
ALK PHOS: 72 U/L (ref 39–117)
ALT: 18 U/L (ref 0–53)
AST: 13 U/L (ref 0–37)
Albumin: 4.4 g/dL (ref 3.5–5.2)
BUN: 19 mg/dL (ref 6–23)
CO2: 26 mEq/L (ref 19–32)
Calcium: 9.9 mg/dL (ref 8.4–10.5)
Chloride: 103 mEq/L (ref 96–112)
Creatinine, Ser: 1.44 mg/dL (ref 0.40–1.50)
GFR: 53.36 mL/min — ABNORMAL LOW (ref >60.00)
GLUCOSE: 116 mg/dL — AB (ref 70–99)
Potassium: 4.7 mEq/L (ref 3.5–5.1)
Sodium: 136 mEq/L (ref 135–145)
TOTAL PROTEIN: 7.3 g/dL (ref 6.0–8.3)
Total Bilirubin: 0.6 mg/dL (ref 0.2–1.2)

## 2014-10-02 LAB — HEMOGLOBIN A1C: Hgb A1c MFr Bld: 6.3 % (ref 4.6–6.5)

## 2014-10-02 LAB — CBC WITH DIFFERENTIAL/PLATELET
BASOS ABS: 0 10*3/uL (ref 0.0–0.1)
Basophils Relative: 0.4 % (ref 0.0–3.0)
Eosinophils Absolute: 0.2 10*3/uL (ref 0.0–0.7)
Eosinophils Relative: 1.8 % (ref 0.0–5.0)
HEMATOCRIT: 46.4 % (ref 39.0–52.0)
Hemoglobin: 15.5 g/dL (ref 13.0–17.0)
LYMPHS ABS: 0.7 10*3/uL (ref 0.7–4.0)
Lymphocytes Relative: 8.1 % — ABNORMAL LOW (ref 12.0–46.0)
MCHC: 33.4 g/dL (ref 30.0–36.0)
MCV: 85.1 fl (ref 78.0–100.0)
MONOS PCT: 1.9 % — AB (ref 3.0–12.0)
Monocytes Absolute: 0.2 10*3/uL (ref 0.1–1.0)
Neutro Abs: 7.8 10*3/uL — ABNORMAL HIGH (ref 1.4–7.7)
Neutrophils Relative %: 87.8 % — ABNORMAL HIGH (ref 43.0–77.0)
Platelets: 217 10*3/uL (ref 150.0–400.0)
RBC: 5.46 Mil/uL (ref 4.22–5.81)
RDW: 14.3 % (ref 11.5–15.5)
WBC: 8.9 10*3/uL (ref 4.0–10.5)

## 2014-10-02 LAB — LIPID PANEL
CHOL/HDL RATIO: 4
Cholesterol: 178 mg/dL (ref 0–200)
HDL: 40.2 mg/dL (ref >39.00)
LDL CALC: 104 mg/dL — AB (ref 0–99)
NONHDL: 137.8
TRIGLYCERIDES: 169 mg/dL — AB (ref 0.0–149.0)
VLDL: 33.8 mg/dL (ref 0.0–40.0)

## 2014-10-02 LAB — VITAMIN B12

## 2014-10-02 LAB — VITAMIN D 25 HYDROXY (VIT D DEFICIENCY, FRACTURES): VITD: 47.66 ng/mL (ref 30.00–100.00)

## 2014-10-02 LAB — TSH: TSH: 1.58 u[IU]/mL (ref 0.35–4.50)

## 2014-10-02 LAB — URIC ACID: Uric Acid, Serum: 6.8 mg/dL (ref 4.0–7.8)

## 2014-10-02 LAB — POCT RAPID STREP A (OFFICE): Rapid Strep A Screen: NEGATIVE

## 2014-10-02 MED ORDER — LOSARTAN POTASSIUM-HCTZ 50-12.5 MG PO TABS: 1.0000 | ORAL_TABLET | Freq: Every day | ORAL | Status: DC

## 2014-10-02 NOTE — Progress Notes (Signed)
Pre visit review using our clinic review tool, if applicable. No additional management support is needed unless otherwise documented below in the visit note. 

## 2014-10-02 NOTE — Patient Instructions (Addendum)
My office will call with lab results.  I will call with medication changes also. Gi office will call with colonoscopy schedule.  Please walk for 5 minutes 4 to 5 times throughout day and 15 minutes on days off.  Increase water intake. Use real cream in coffee rather than imitation cream with partially hydrogenated oils.  You likely have a viral sore throat/upper respiratory infection. However, if your culture comes back growing bacteria, I will call in an antibiotic. In the meantime, you may use benzocaine throat lozenges or throat spray for comfort. Salt water gargles (1/4 cup warm water mixed with 1/4 tsp salt) twice daily & listerene gargles twice daily. If you develop runny nose, start Neilmed sinus rinses daily.  Rest, sip fluids every hour.  Feel better!  Sore Throat A sore throat is pain, burning, irritation, or scratchiness of the throat. There is often pain or tenderness when swallowing or talking. A sore throat may be accompanied by other symptoms, such as coughing, sneezing, fever, and swollen neck glands. A sore throat is often the first sign of another sickness, such as a cold, flu, strep throat, or mononucleosis (commonly known as mono). Most sore throats go away without medical treatment. CAUSES  The most common causes of a sore throat include:  A viral infection, such as a cold, flu, or mono.  A bacterial infection, such as strep throat, tonsillitis, or whooping cough.  Seasonal allergies.  Dryness in the air.  Irritants, such as smoke or pollution.  Gastroesophageal reflux disease (GERD). HOME CARE INSTRUCTIONS   Only take over-the-counter medicines as directed by your caregiver.  Drink enough fluids to keep your urine clear or pale yellow.  Rest as needed.  Try using throat sprays, lozenges, or sucking on hard candy to ease any pain (if older than 4 years or as directed).  Sip warm liquids, such as broth, herbal tea, or warm water with honey to relieve pain  temporarily. You may also eat or drink cold or frozen liquids such as frozen ice pops.  Gargle with salt water (mix 1 tsp salt with 8 oz of water).  Do not smoke and avoid secondhand smoke.  Put a cool-mist humidifier in your bedroom at night to moisten the air. You can also turn on a hot shower and sit in the bathroom with the door closed for 5 10 minutes. SEEK IMMEDIATE MEDICAL CARE IF:  You have difficulty breathing.  You are unable to swallow fluids, soft foods, or your saliva.  You have increased swelling in the throat.  Your sore throat does not get better in 7 days.  You have nausea and vomiting.  You have a fever or persistent symptoms for more than 2 3 days.  You have a fever and your symptoms suddenly get worse. MAKE SURE YOU:   Understand these instructions.  Will watch your condition.  Will get help right away if you are not doing well or get worse. Document Released: 08/10/2004 Document Revised: 06/19/2012 Document Reviewed: 03/10/2012 [facility] Patient Information 2014 [place], [place].

## 2014-10-02 NOTE — Progress Notes (Signed)
Subjective:     Javier Casey is a 59 y.o. male who presents with 2 days diarrhea & malaise. Diarrhea has resolved. Felt better yesterday, went to work. Today woke with swollen & sore throat, nasal congestion, cough & malaise. Denies fever, diarrhea, nausea. He is also here for f/u of HTN, lipids, vit B12 & d deficicency.  HTN: lisinopril causes cough-pt is tired of cough. Bp at goal. He is not exercising-says gets too tired. Continues to have LE edema. Past w/u did not indicate show signs of HF. Edema is likely due to sedentary lifestyle. Could be PVD. He took HCTZ in past, but provider D/c'd due to elevated uric acid. He has never had gout flare. I took him off of losartan to see if it was contributing to LE edema-no better off of it. Will start losartan/hctz low dose. Lipids: current med: atorvastatin 20 mg. Has leg cramps every night. He had stopped statin for a few mos. due to leg camps w/improvement. He decided to take med again because he is not compliant with healthy diet & exercise.  Vit deficiencies: taking B complex & vitamin D3 without intolerable SE. Affect flat-depressed?  The following portions of the patient's history were reviewed and updated as appropriate: allergies, current medications, past medical history, past social history, past surgical history and problem list.  Review of Systems Constitutional: negative for night sweats Eyes: negative for visual disturbance Ears, nose, mouth, throat, and face: positive for nasal congestion, negative for earaches Respiratory: positive for cough, negative for wheezing Cardiovascular: negative for exertional chest pressure/discomfort and irregular heart beat Integument/breast: negative for rash Musculoskeletal:negative for arthralgias    Objective:    BP 124/75 mmHg  Pulse 71  Temp(Src) 98.2 F (36.8 C) (Oral)  Ht 6' 1.5" (1.867 m)  Wt 287 lb (130.182 kg)  BMI 37.35 kg/m2  SpO2 97% BP 124/75 mmHg  Pulse 71  Temp(Src) 98.2 F  (36.8 C) (Oral)  Ht 6' 1.5" (1.867 m)  Wt 287 lb (130.182 kg)  BMI 37.35 kg/m2  SpO2 97% General appearance: alert, cooperative, appears stated age, mild distress and mildly obese Head: Normocephalic, without obvious abnormality, atraumatic Eyes: negative findings: lids and lashes normal and conjunctivae and sclerae normal Ears: abnormal TM right ear - retracted and abnormal TM left ear - retracted Throat: abnormal findings: mild erythema posterior pharynx. mild swelling of R posterior pharynx Neck: no adenopathy, no carotid bruit, supple, symmetrical, trachea midline and thyroid not enlarged, symmetric, no tenderness/mass/nodules Lungs: clear to auscultation bilaterally Heart: regular rate and rhythm, S1, S2 normal, no murmur, click, rub or gallop Extremities: edema +2 pretibial bilat LE Neurologic: Grossly normal    Assessment:Plan     1. Sore throat Likely viral - POCT rapid strep A - Upper Respiratory Culture  2. Hyperlipidemia Change to crestor, want to get labs back first. - CBC with Differential/Platelet - Comprehensive metabolic panel - Lipid panel - Hemoglobin A1c  3. Vitamin B 12 deficiency - Vitamin B12  4. Vitamin D deficiency - Vit D  25 hydroxy (rtn osteoporosis monitoring)  5. Other fatigue - TSH - Hemoglobin A1c  6. Essential hypertension - losartan-hydrochlorothiazide (HYZAAR) 50-12.5 MG per tablet; Take 1 tablet by mouth daily.  Dispense: 30 tablet; Refill: 1 - CBC with Differential/Platelet - Comprehensive metabolic panel - Lipid panel - Urinalysis, Routine w reflex microscopic  7. Preventative health care - HIV antibody  8. History of colonic polyps Last scope 2009, polyps diverticulitis - Ambulatory referral to Gastroenterology  F/u  6 weeks-HTN change meds, lipids changed meds

## 2014-10-02 NOTE — Addendum Note (Signed)
Addended by: Audley Hose on: 10/02/2014 03:32 PM   Modules accepted: Orders

## 2014-10-03 LAB — HIV ANTIBODY (ROUTINE TESTING W REFLEX): HIV: NONREACTIVE

## 2014-10-05 ENCOUNTER — Telehealth: Payer: Self-pay | Admitting: Nurse Practitioner

## 2014-10-05 LAB — URIC ACID: URIC ACID, SERUM: 6.9 mg/dL (ref 4.0–7.8)

## 2014-10-05 LAB — CULTURE, UPPER RESPIRATORY: ORGANISM ID, BACTERIA: NORMAL

## 2014-10-05 NOTE — Telephone Encounter (Signed)
Sent results through my chart.

## 2014-10-05 NOTE — Telephone Encounter (Signed)
pls call pt: Advise Labs indicate he is developing diabetes. Pls schedule appt to discuss interventions. Ask if feeling better, labs indicate he has viral illness.

## 2014-10-08 ENCOUNTER — Ambulatory Visit: Payer: Self-pay | Admitting: Nurse Practitioner

## 2014-10-09 ENCOUNTER — Ambulatory Visit: Payer: 59 | Admitting: Nurse Practitioner

## 2014-11-10 ENCOUNTER — Encounter: Payer: Self-pay | Admitting: Gastroenterology

## 2014-11-12 ENCOUNTER — Ambulatory Visit (INDEPENDENT_AMBULATORY_CARE_PROVIDER_SITE_OTHER): Payer: 59 | Admitting: Nurse Practitioner

## 2014-11-12 ENCOUNTER — Encounter: Payer: Self-pay | Admitting: Nurse Practitioner

## 2014-11-12 VITALS — BP 131/81 | HR 86 | Temp 97.9°F | Ht 73.5 in | Wt 286.0 lb

## 2014-11-12 DIAGNOSIS — I10 Essential (primary) hypertension: Secondary | ICD-10-CM | POA: Diagnosis not present

## 2014-11-12 DIAGNOSIS — E785 Hyperlipidemia, unspecified: Secondary | ICD-10-CM | POA: Diagnosis not present

## 2014-11-12 DIAGNOSIS — M7989 Other specified soft tissue disorders: Secondary | ICD-10-CM | POA: Diagnosis not present

## 2014-11-12 DIAGNOSIS — Z6837 Body mass index (BMI) 37.0-37.9, adult: Secondary | ICD-10-CM

## 2014-11-12 MED ORDER — LOSARTAN POTASSIUM-HCTZ 50-12.5 MG PO TABS: 1.0000 | ORAL_TABLET | Freq: Every day | ORAL | Status: DC

## 2014-11-12 MED ORDER — ROSUVASTATIN CALCIUM 10 MG PO TABS: 10.0000 mg | ORAL_TABLET | Freq: Every day | ORAL | Status: DC

## 2014-11-12 NOTE — Progress Notes (Signed)
Pre visit review using our clinic review tool, if applicable. No additional management support is needed unless otherwise documented below in the visit note. 

## 2014-11-12 NOTE — Patient Instructions (Signed)
Great job with diet & exercise changes! For best health, weight loss goal is 60  pounds. This will take about 14  months. If you want to count calories, limit to   2200   calories daily if you are exercising 1 to 3 times week &      calories if exercising 5 times weekly or more. Helpful phone app for calorie counting is "Go Meals". Consider reading Eat to Live by Excell Seltzer and begin implementing principles for best human nutrition & health.  When you diverge from making healthy choices, get back to healthy choices with the next meal or snack.  As discussed,cut out refined sugar- anything that is sweet when you eat or drink it except fresh fruit.  Cut out refined grains- bread, rolls, biscuits, bagels, muffins, pasta and cereals that have less than 4 grams fiber per serving.  Limit animal fats & proteins (meat, eggs, cheese) to 3 - 4 times/week.  Develop lifelong habits of exercise most days of the week: take a 30 minute walk. The benefits include weight loss, lower risk for heart disease, diabetes, stroke, high blood pressure, lower rates of depression & dementia, better sleep quality & bone health.  Please return in 2-3 months.

## 2014-11-12 NOTE — Progress Notes (Signed)
Subjective:     Javier Casey is a 59 y.o. male presents for f/u htn, hyperlipidemia, desires to lose weight. Hands noted to be swollen today. Htn: well controlled on losartan/HCTZ 50/12.5. Cough resolved since stopped lisinopril. No intol SE. RF: male, sedentary, obese, prediabetes. CKD-GFR 53. Made some diet changes, trying to increase "steps"  Hyperlipidemia: stopped atorvastatin, leg cramps resolved. Willing to try crestor. Discussed benefits of statin & lifestyle changes. desires to lose weight: BMI 37. Discussed risk for obesity related diseases. Discussed benefits of lifestyle change. Discussed specific changes, tools. Hands noted to be swollen today: Bilat hands are tight, swollen. Pt says "little uncomfortable". Swelling comes & goes. Pt wonders if it is related to intermittent swelling of face. MCTD?   The following portions of the patient's history were reviewed and updated as appropriate: allergies, current medications, past medical history, past social history, past surgical history and problem list.  Review of Systems Pertinent items are noted in HPI.    Objective:    BP 131/81 mmHg  Pulse 86  Temp(Src) 97.9 F (36.6 C) (Oral)  Ht 6' 1.5" (1.867 m)  Wt 286 lb (129.729 kg)  BMI 37.22 kg/m2  SpO2 97% BP 131/81 mmHg  Pulse 86  Temp(Src) 97.9 F (36.6 C) (Oral)  Ht 6' 1.5" (1.867 m)  Wt 286 lb (129.729 kg)  BMI 37.22 kg/m2  SpO2 97% General appearance: alert, cooperative, appears stated age, fatigued and no distress Head: Normocephalic, without obvious abnormality, atraumatic Eyes: negative findings: lids and lashes normal and conjunctivae and sclerae normal Neck: no adenopathy, no carotid bruit, supple, symmetrical, trachea midline and thyroid not enlarged, symmetric, no tenderness/mass/nodules Lungs: clear to auscultation bilaterally Heart: regular rate and rhythm, S1, S2 normal, no murmur, click, rub or gallop Extremities: edema hands, tight, swollen R hand larger  than L, look disproportionate to rest of body Neurologic: Grossly normal    Assessment:Plan   1. Hyperlipidemia start - rosuvastatin (CRESTOR) 10 MG tablet; Take 1 tablet (10 mg total) by mouth daily.  Dispense: 30 tablet; Refill: 3  2. HYPERTENSION, BENIGN ESSENTIAL continue - losartan-hydrochlorothiazide (HYZAAR) 50-12.5 MG per tablet; Take 1 tablet by mouth daily.  Dispense: 30 tablet; Refill: 3  3. Swelling of both hands DD: MCTD, scleroderma Labs next OV  4. BMI 37.0-37.9, adult Wt loss goal 60 lbs in 14 mos. Cal goal 2200 cal/day, exercise goal light to mod Diet HO given.  F/u 2 mos-lipids, A1c, CMET, ANA, CBC, ESR, CK, RF, LA, anticardiolipin ab Spent 25 Minutes with patient, 50% or more was spent in counseling.

## 2014-11-17 ENCOUNTER — Ambulatory Visit (AMBULATORY_SURGERY_CENTER): Payer: Self-pay

## 2014-11-17 VITALS — Ht 73.5 in | Wt 284.8 lb

## 2014-11-17 DIAGNOSIS — Z86010 Personal history of colonic polyps: Secondary | ICD-10-CM

## 2014-11-17 MED ORDER — MOVIPREP 100 G PO SOLR: ORAL | Status: DC

## 2014-11-17 NOTE — Progress Notes (Signed)
Per pt, no allergies to soy or egg products.Pt not taking any weight loss meds or using  O2 at home. 

## 2014-11-19 ENCOUNTER — Encounter: Payer: Self-pay | Admitting: Internal Medicine

## 2014-11-20 ENCOUNTER — Encounter: Payer: Self-pay | Admitting: Nurse Practitioner

## 2014-12-01 ENCOUNTER — Ambulatory Visit (AMBULATORY_SURGERY_CENTER): Payer: 59 | Admitting: Internal Medicine

## 2014-12-01 ENCOUNTER — Other Ambulatory Visit: Payer: Self-pay | Admitting: Internal Medicine

## 2014-12-01 ENCOUNTER — Encounter: Payer: Self-pay | Admitting: Internal Medicine

## 2014-12-01 VITALS — BP 113/66 | HR 72 | Temp 97.2°F | Resp 21 | Ht 73.5 in | Wt 284.0 lb

## 2014-12-01 DIAGNOSIS — D128 Benign neoplasm of rectum: Secondary | ICD-10-CM

## 2014-12-01 DIAGNOSIS — D124 Benign neoplasm of descending colon: Secondary | ICD-10-CM | POA: Diagnosis not present

## 2014-12-01 DIAGNOSIS — D129 Benign neoplasm of anus and anal canal: Secondary | ICD-10-CM

## 2014-12-01 DIAGNOSIS — Z86010 Personal history of colonic polyps: Secondary | ICD-10-CM

## 2014-12-01 DIAGNOSIS — D123 Benign neoplasm of transverse colon: Secondary | ICD-10-CM

## 2014-12-01 MED ORDER — SODIUM CHLORIDE 0.9 % IV SOLN: 500.0000 mL | INTRAVENOUS | Status: DC

## 2014-12-01 NOTE — Progress Notes (Signed)
Called to room to assist during endoscopic procedure.  Patient ID and intended procedure confirmed with present staff. Received instructions for my participation in the procedure from the performing physician.  

## 2014-12-01 NOTE — Op Note (Signed)
[facility]  [address] [place], [address]   COLONOSCOPY PROCEDURE REPORT  PATIENT: Javier, Casey  MR#: [id number] BIRTHDATE: 10-20-1955 , 59  yrs. old GENDER: male ENDOSCOPIST: Jerene Bears, MD REFERRED XL:KGMWN Patterson, M.D. PROCEDURE DATE:  12/01/2014 PROCEDURE:   Colonoscopy, surveillance and Colonoscopy with snare polypectomy First Screening Colonoscopy - Avg.  risk and is 50 yrs.  old or older - No.  Prior Negative Screening - Now for repeat screening. N/A  History of Adenoma - Now for follow-up colonoscopy & has been > or = to 3 yrs.  Yes hx of adenoma.  Has been 3 or more years since last colonoscopy.  Polyps removed today? Yes ASA CLASS:   Class II INDICATIONS:Surveillance due to prior colonic neoplasia and PH Colon Adenoma (last colonoscopy 2009 recommended interval 3 yrs). MEDICATIONS: Monitored anesthesia care and Propofol 400 mg IV  DESCRIPTION OF PROCEDURE:   After the risks benefits and alternatives of the procedure were thoroughly explained, informed consent was obtained.  The digital rectal exam revealed no rectal mass.   The LB [id number]  endoscope was introduced through the anus and advanced to the terminal ileum which was intubated for a short distance. No adverse events experienced.   The quality of the prep was (MoviPrep was used) good.  The instrument was then slowly withdrawn as the colon was fully examined.   COLON FINDINGS: The examined terminal ileum appeared to be normal. Two semi-pedunculated polyps ranging from 7 to 27mm in size were found in the transverse colon and descending colon.  Polypectomies were performed using snare cautery.  The resection was complete, the polyp tissue was completely retrieved and sent to histology. Four sessile polyps ranging between 3-24mm in size were found at the hepatic flexure (1), in the transverse colon (2), and rectum (1). Polypectomies were performed with a cold snare.  The  resection was complete, the polyp tissue was completely retrieved and sent to histology.   There was severe diverticulosis noted in the ascending colon to the sigmoid colon. In the sigmoid colon there was hypertrophied and thickened folds with petechiae present and mild inflammatory change.  Retroflexed views revealed internal hemorrhoids. The time to cecum = 3.7 Withdrawal time = 23.4   The scope was withdrawn and the procedure completed.  COMPLICATIONS: There were no immediate complications.  ENDOSCOPIC IMPRESSION: 1.   The examined terminal ileum appeared to be normal 2.   Two semi-pedunculated polyps ranging from 7 to 56mm in size were found in the transverse colon and descending colon; polypectomies were performed using snare cautery 3.   Four sessile polyps ranging between 3-29mm in size were found at the hepatic flexure, in the transverse colon, and rectum; polypectomies were performed with a cold snare 4.   Severe diverticulosis was noted in the ascending colon to sigmoid colon with mucosal thickening and mild inflammatory change  RECOMMENDATIONS: 1.  Await pathology results 2.  Avoid all NSAIDS for the next 2 weeks. 3.  High fiber diet 4.  Repeat Colonoscopy in 3 years. 5.  You will receive a letter within 1-2 weeks with the results of your biopsy as well as final recommendations.  Please call my office if you have not received a letter after 3 weeks.  eSigned:  Jerene Bears, MD 12/01/2014 10:11 AM   cc: the patient, [name], PA-C   PATIENT NAME:  Javier, Casey MR#: [id number]

## 2014-12-01 NOTE — Progress Notes (Signed)
To recovery, report to McCoy, RN, VSS 

## 2014-12-01 NOTE — Patient Instructions (Signed)
Discharge instructions given. Handouts on polyps and diverticulosis. Hold aspirin and anything containing aspirin for 2 weeks. Resume previous medications.l YOU HAD AN ENDOSCOPIC PROCEDURE TODAY AT [facility]:   Refer to the procedure report that was given to you for any specific questions about what was found during the examination.  If the procedure report does not answer your questions, please call your gastroenterologist to clarify.  If you requested that your care partner not be given the details of your procedure findings, then the procedure report has been included in a sealed envelope for you to review at your convenience later.  YOU SHOULD EXPECT: Some feelings of bloating in the abdomen. Passage of more gas than usual.  Walking can help get rid of the air that was put into your GI tract during the procedure and reduce the bloating. If you had a lower endoscopy (such as a colonoscopy or flexible sigmoidoscopy) you may notice spotting of blood in your stool or on the toilet paper. If you underwent a bowel prep for your procedure, you may not have a normal bowel movement for a few days.  Please Note:  You might notice some irritation and congestion in your nose or some drainage.  This is from the oxygen used during your procedure.  There is no need for concern and it should clear up in a day or so.  SYMPTOMS TO REPORT IMMEDIATELY:   Following lower endoscopy (colonoscopy or flexible sigmoidoscopy):  Excessive amounts of blood in the stool  Significant tenderness or worsening of abdominal pains  Swelling of the abdomen that is new, acute  Fever of 100F or higher   For urgent or emergent issues, a gastroenterologist can be reached at any hour by calling [phone].   DIET: Your first meal following the procedure should be a small meal and then it is ok to progress to your normal diet. Heavy or fried foods are harder to digest and may make you feel nauseous or  bloated.  Likewise, meals heavy in dairy and vegetables can increase bloating.  Drink plenty of fluids but you should avoid alcoholic beverages for 24 hours.  ACTIVITY:  You should plan to take it easy for the rest of today and you should NOT DRIVE or use heavy machinery until tomorrow (because of the sedation medicines used during the test).    FOLLOW UP: Our staff will call the number listed on your records the next business day following your procedure to check on you and address any questions or concerns that you may have regarding the information given to you following your procedure. If we do not reach you, we will leave a message.  However, if you are feeling well and you are not experiencing any problems, there is no need to return our call.  We will assume that you have returned to your regular daily activities without incident.  If any biopsies were taken you will be contacted by phone or by letter within the next 1-3 weeks.  Please call us at [phone] if you have not heard about the biopsies in 3 weeks.    SIGNATURES/CONFIDENTIALITY: You and/or your care partner have signed paperwork which will be entered into your electronic medical record.  These signatures attest to the fact that that the information above on your After Visit Summary has been reviewed and is understood.  Full responsibility of the confidentiality of this discharge information lies with you and/or your care-partner.

## 2014-12-02 ENCOUNTER — Telehealth: Payer: Self-pay | Admitting: *Deleted

## 2014-12-02 NOTE — Telephone Encounter (Signed)
  Follow up Call-  No answer, unable to leave message, mailboxes full and pt. Not at work as yet.

## 2014-12-13 ENCOUNTER — Encounter: Payer: Self-pay | Admitting: Internal Medicine

## 2015-02-11 ENCOUNTER — Other Ambulatory Visit: Payer: Self-pay | Admitting: Nurse Practitioner

## 2015-02-11 ENCOUNTER — Ambulatory Visit (INDEPENDENT_AMBULATORY_CARE_PROVIDER_SITE_OTHER): Payer: 59 | Admitting: Nurse Practitioner

## 2015-02-11 ENCOUNTER — Encounter: Payer: Self-pay | Admitting: Nurse Practitioner

## 2015-02-11 VITALS — BP 128/79 | HR 75 | Temp 97.8°F | Resp 16 | Ht 73.5 in | Wt 279.0 lb

## 2015-02-11 DIAGNOSIS — R7309 Other abnormal glucose: Secondary | ICD-10-CM | POA: Diagnosis not present

## 2015-02-11 DIAGNOSIS — E785 Hyperlipidemia, unspecified: Secondary | ICD-10-CM | POA: Diagnosis not present

## 2015-02-11 DIAGNOSIS — I10 Essential (primary) hypertension: Secondary | ICD-10-CM

## 2015-02-11 DIAGNOSIS — R7303 Prediabetes: Secondary | ICD-10-CM

## 2015-02-11 DIAGNOSIS — T783XXD Angioneurotic edema, subsequent encounter: Secondary | ICD-10-CM

## 2015-02-11 DIAGNOSIS — M7989 Other specified soft tissue disorders: Secondary | ICD-10-CM

## 2015-02-11 MED ORDER — LOSARTAN POTASSIUM-HCTZ 50-12.5 MG PO TABS: 1.0000 | ORAL_TABLET | Freq: Every day | ORAL | Status: DC

## 2015-02-11 MED ORDER — ROSUVASTATIN CALCIUM 10 MG PO TABS: ORAL_TABLET | ORAL | Status: DC

## 2015-02-11 MED ORDER — PREDNISONE 10 MG PO TABS: ORAL_TABLET | ORAL | Status: DC

## 2015-02-11 NOTE — Progress Notes (Signed)
Pre visit review using our clinic review tool, if applicable. No additional management support is needed unless otherwise documented below in the visit note. 

## 2015-02-11 NOTE — Progress Notes (Signed)
Subjective:     Javier Casey is a 59 y.o. male f/u for hyperlipidemia, prediabetes, htn, recent episode of angioedema, persistent swelling of hands & LE. Hyperlipidemia: taking crestor for 3 mos. No intol SE, but pt concerned about potential SE. Would rather not take med. Discussed potential benefit & harm. He has lost 7 lb in 3 mos due to increased activity.  Prediabetes: Last a1C 6.3. He has made diet & activity changes. Understands increased risk of DM2.  Htn: well controlled. RF: obesity, male, hyperlipidemia. No evidence of end organ damage. recent episode of angioedema accompanied by red painful rash on R upper inner arm. Took 10 mg prednisone w/resolve of both symptoms. This is typical of historical episodes of spontaneous lip swelling w/no known trigger. Low dose & short course prednisone always resolves symptoms. Refill PRN prescription.  persistent swelling of hands & LE: Onset-several yrs. Improved in LE since increased activity. C/w connective tissue syndrome such as CREST/scleroderma. Will screen w/labs today.  The following portions of the patient's history were reviewed and updated as appropriate: allergies, current medications, past family history, past medical history, past social history, past surgical history and problem list.  Review of Systems Pertinent items are noted in HPI.    Objective:    BP 128/79 mmHg  Pulse 75  Temp(Src) 97.8 F (36.6 C) (Oral)  Resp 16  Ht 6' 1.5" (1.867 m)  Wt 279 lb (126.554 kg)  BMI 36.31 kg/m2  SpO2 97% BP 128/79 mmHg  Pulse 75  Temp(Src) 97.8 F (36.6 C) (Oral)  Resp 16  Ht 6' 1.5" (1.867 m)  Wt 279 lb (126.554 kg)  BMI 36.31 kg/m2  SpO2 97% General appearance: alert, cooperative, appears stated age and no distress Eyes: negative findings: lids and lashes normal and conjunctivae and sclerae normal Lungs: clear to auscultation bilaterally Heart: regular rate and rhythm, S1, S2 normal, no murmur, click, rub or  gallop Extremities: edema hands are enlarged, tight. LE are pasty, edema is +1 pretibial/mid-shin. Does not go up to knees as usual. Neurologic: Grossly normal    Assessment:Plan     1. HYPERTENSION, BENIGN ESSENTIAL cont - losartan-hydrochlorothiazide (HYZAAR) 50-12.5 MG per tablet; Take 1 tablet by mouth daily.  Dispense: 90 tablet; Refill: 1 - Comprehensive metabolic panel - Microalbumin / creatinine urine ratio 2. Hyperlipidemia decrease - rosuvastatin (CRESTOR) 10 MG tablet; Take 1T PO 3d/week  Dispense: 36 tablet; Refill: 1 - Lipid panel - Comprehensive metabolic panel  3. Angioedema, subsequent encounter unknown etio - predniSONE (DELTASONE) 10 MG tablet; Take 2T PO once, then repeat in 12 h if swelling persists  Dispense: 26 tablet; Refill: 2  4. Prediabetes Cont increased activity & diet changes - Hemoglobin A1c  5. 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

## 2015-02-11 NOTE — Patient Instructions (Signed)
Decrease crestor to 3 days week.  Continue blood pressure medicine.  GREAT job with weight loss!!!! Keep up the super work with increased activity.  My office will call with lab results-it may be 2 weeks.  It has been a pleasure to partner with you in your healthcare!

## 2015-02-12 LAB — LIPID PANEL
Cholesterol: 142 mg/dL (ref 0–200)
HDL: 40.1 mg/dL (ref >39.00)
LDL Cholesterol: 74 mg/dL (ref 0–99)
NonHDL: 101.4
TRIGLYCERIDES: 138 mg/dL (ref 0.0–149.0)
Total CHOL/HDL Ratio: 4
VLDL: 27.6 mg/dL (ref 0.0–40.0)

## 2015-02-12 LAB — COMPREHENSIVE METABOLIC PANEL
ALT: 17 U/L (ref 0–53)
AST: 14 U/L (ref 0–37)
Albumin: 4.4 g/dL (ref 3.5–5.2)
Alkaline Phosphatase: 54 U/L (ref 39–117)
BILIRUBIN TOTAL: 0.6 mg/dL (ref 0.2–1.2)
BUN: 15 mg/dL (ref 6–23)
CO2: 27 meq/L (ref 19–32)
Calcium: 9.5 mg/dL (ref 8.4–10.5)
Chloride: 103 mEq/L (ref 96–112)
Creatinine, Ser: 1.41 mg/dL (ref 0.40–1.50)
GFR: 54.6 mL/min — ABNORMAL LOW (ref >60.00)
Glucose, Bld: 79 mg/dL (ref 70–99)
Potassium: 3.8 mEq/L (ref 3.5–5.1)
Sodium: 139 mEq/L (ref 135–145)
Total Protein: 7 g/dL (ref 6.0–8.3)

## 2015-02-12 LAB — HEMOGLOBIN A1C: HEMOGLOBIN A1C: 5.8 % (ref 4.6–6.5)

## 2015-02-12 LAB — CBC WITH DIFFERENTIAL/PLATELET
BASOS ABS: 0.1 10*3/uL (ref 0.0–0.1)
Basophils Relative: 0.8 % (ref 0.0–3.0)
EOS PCT: 3.7 % (ref 0.0–5.0)
Eosinophils Absolute: 0.3 10*3/uL (ref 0.0–0.7)
HCT: 44.2 % (ref 39.0–52.0)
Hemoglobin: 14.8 g/dL (ref 13.0–17.0)
LYMPHS PCT: 22.6 % (ref 12.0–46.0)
Lymphs Abs: 1.7 10*3/uL (ref 0.7–4.0)
MCHC: 33.4 g/dL (ref 30.0–36.0)
MCV: 86.2 fl (ref 78.0–100.0)
MONO ABS: 0.5 10*3/uL (ref 0.1–1.0)
Monocytes Relative: 6.8 % (ref 3.0–12.0)
NEUTROS PCT: 66.1 % (ref 43.0–77.0)
Neutro Abs: 4.9 10*3/uL (ref 1.4–7.7)
PLATELETS: 169 10*3/uL (ref 150.0–400.0)
RBC: 5.12 Mil/uL (ref 4.22–5.81)
RDW: 14.9 % (ref 11.5–15.5)
WBC: 7.4 10*3/uL (ref 4.0–10.5)

## 2015-02-12 LAB — CK: Total CK: 72 U/L (ref 7–232)

## 2015-02-12 LAB — RHEUMATOID FACTOR: Rhuematoid fact SerPl-aCnc: <10 IU/mL (ref <=14)

## 2015-02-12 LAB — URINALYSIS, MICROSCOPIC ONLY: RBC / HPF: NONE SEEN (ref 0–?)

## 2015-02-12 LAB — MICROALBUMIN / CREATININE URINE RATIO
CREATININE, U: 237.3 mg/dL
MICROALB UR: 5.3 mg/dL — AB (ref 0.0–1.9)
Microalb Creat Ratio: 2.2 mg/g (ref 0.0–30.0)

## 2015-02-15 LAB — CARDIOLIPIN ANTIBODIES, IGM+IGG
Anticardiolipin IgG: 4 GPL U/mL (ref <23)
Anticardiolipin IgM: 3 MPL U/mL (ref <11)

## 2015-02-15 LAB — SEDIMENTATION RATE: Sed Rate: 19 mm/hr (ref 0–22)

## 2015-02-15 LAB — TT MIX+TTN: THROMBIN TIME MIX: 18.9 s (ref 0.0–20.0)

## 2015-02-15 LAB — ANTINUCLEAR ANTIBODIES, IFA: ANA TITER 1: NEGATIVE

## 2015-02-18 ENCOUNTER — Telehealth: Payer: Self-pay | Admitting: Family Medicine

## 2015-02-18 LAB — LUPUS ANTICOAGULANT

## 2015-02-18 NOTE — Telephone Encounter (Signed)
Please call patient, I have received the lab work from his office visit with [name], NP last week.  All of the lab work, including the autoimmune lab work and his rheumatoid factor are normal. Patient continues to have swelling, I would like to have him follow-up so that I may evaluate him. Please explained to him that Layne is no longer here and I am the new provider at this location.

## 2015-02-19 ENCOUNTER — Telehealth: Payer: Self-pay | Admitting: Family Medicine

## 2015-02-19 NOTE — Telephone Encounter (Signed)
LM w/ wife for pt to CB. Unable to leave detailed message with anyone but patient.

## 2015-02-19 NOTE — Telephone Encounter (Signed)
Pt called back in regards to his labs. I advised him of his lab results and that [name] is no longer a provider at this office. He stated that this has been an ongoing thing since 2005, he stated that he has a severe reaction from levaquin and robitussin and since then has been dealing with the swelling. I offered pt an apt with Dr. Raoul Pitch, pt stated that he has an apt on 08/12/15, would it be okay to wait til then? Pt also requested a copy of his labs. I have printed out a copy of his labs and put them up front for p/u. He also wanted to know if labs have been release to be viewed on MyChart, please advise. Thanks.

## 2015-02-19 NOTE — Telephone Encounter (Signed)
I only need to see him sooner if he is having acute issues, or he feels his swelling is worsening. Otherwise his already scheduled appointment is fine. I will release his labs for his view.

## 2015-02-19 NOTE — Telephone Encounter (Signed)
Patient aware.

## 2015-02-24 ENCOUNTER — Telehealth: Payer: Self-pay | Admitting: *Deleted

## 2015-02-24 NOTE — Telephone Encounter (Signed)
Pt called concerned about a lab the seen on MyChart. Pt stated that the test for lupus. He stated that it was cancelled and in the comments it stated that the dPT confirm ratio is consistent with the presence of a lupus anticoagulant. He wants to know what does this mean? Please advise. Thanks. This was a pt of [employer].

## 2015-08-12 ENCOUNTER — Ambulatory Visit: Payer: 59 | Admitting: Family Medicine

## 2015-08-13 ENCOUNTER — Encounter: Payer: Self-pay | Admitting: Family Medicine

## 2015-08-13 ENCOUNTER — Ambulatory Visit (INDEPENDENT_AMBULATORY_CARE_PROVIDER_SITE_OTHER): Payer: 59 | Admitting: Family Medicine

## 2015-08-13 VITALS — BP 134/81 | HR 64 | Temp 98.0°F | Resp 20 | Ht 74.0 in | Wt 282.0 lb

## 2015-08-13 DIAGNOSIS — I10 Essential (primary) hypertension: Secondary | ICD-10-CM | POA: Diagnosis not present

## 2015-08-13 DIAGNOSIS — T783XXD Angioneurotic edema, subsequent encounter: Secondary | ICD-10-CM | POA: Diagnosis not present

## 2015-08-13 DIAGNOSIS — E785 Hyperlipidemia, unspecified: Secondary | ICD-10-CM | POA: Diagnosis not present

## 2015-08-13 MED ORDER — PREDNISONE 10 MG PO TABS: ORAL_TABLET | ORAL | Status: DC

## 2015-08-13 MED ORDER — ATORVASTATIN CALCIUM 10 MG PO TABS: 10.0000 mg | ORAL_TABLET | Freq: Every day | ORAL | Status: DC

## 2015-08-13 NOTE — Progress Notes (Signed)
Subjective:    Patient ID: Javier Casey, male    DOB: [birth year], 60 y.o.   MRN: [id number]  HPI  Patient presents for office visit for follow-up on hypertension, hyperlipidemia and history of angioedema.  Hypertension: Patient continues on losartan/HCTZ 50-12 0.5. He reports compliance with medication, he reports no negative side effects. Patient does watch the salt content in his diet, and attempts to exercise routinely. He denies any chest pain, shortness of breath, syncope or visual changes. He does endorse lower extremity edema which she states is his normal. Patient has had angioedema in the past, and still has intermittent flares of angioedema, which respond to steroids. He is on losartan, which is a potential of being the cause of his angioedema, but he states that this was a problem before his losartan start.  Hyperlipidemia: Patient has been on Crestor every other day, at a low dose secondary to myalgias. He also endorses extremely bad leg cramps with daily stat use. His insurance has changed and they are no longer covering the Crestor, so he is wondering if he needs to be on this medication any longer. Patient has a history of hypertension, prediabetes with a family history of heart disease. Patient attempts to exercise routinely and watch his diet.  History of Angioedema: Patient seems to think that his angioedema was from Levaquin used. This has occurred multiple years ago. He continues to have intermittent angioedema despite the use of Levaquin. Patient states that he watches what each drinks and takes into his body very closely what he has is angioedema seems to be nothing that he can place his finger on. He denies angioedema with NSAID use. Patient is on losartan, but states this medication was started after his angioedema.  Past Medical History  Diagnosis Date  . Hypertension   . GERD (gastroesophageal reflux disease)   . Hyperlipidemia   . Tubular adenoma of colon 2009  .  Allergy     swelling lips ,nose, hands and feet, tongue, but unknown reason  . Asthma    Allergies  Allergen Reactions  . Ciprofloxacin     Severe swelling and rash  . Levofloxacin     Diffuse swelling with angioedema   Social History   Social History  . Marital Status: Married    Spouse Name: N/A  . Number of Children: N/A  . Years of Education: N/A   Occupational History  . Not on file.   Social History Main Topics  . Smoking status: Never Smoker   . Smokeless tobacco: Never Used  . Alcohol Use: 1.8 oz/week    3 Cans of beer per week     Comment: < 8 glasses / week of wine  . Drug Use: No  . Sexual Activity: Yes   Other Topics Concern  . Not on file   Social History Narrative   Review of Systems Negative, with the exception of above mentioned in HPI     Objective:   Physical Exam BP 134/81 mmHg  Pulse 64  Temp(Src) 98 F (36.7 C) (Oral)  Resp 20  Ht 6\' 2"  (1.88 m)  Wt 282 lb (127.914 kg)  BMI 36.19 kg/m2  SpO2 99% Gen: Afebrile. No acute distress. Nontoxic in appearance, well-developed, well-nourished, Caucasian male. HENT: AT. .  MMM.  Eyes:Pupils Equal Round Reactive to light, Extraocular movements intact,  Conjunctiva without redness, discharge or icterus. CV: RRR no murmur, clicks gallops or rubs, +1 edema, +2/4 P posterior tibialis pulses Chest: CTAB, no  wheeze or crackles Abd: Soft. NTND. BS present. No Masses palpated.  Skin: No rashes, purpura or petechiae.  Neuro: Normal gait. PERLA. EOMi. Alert. Orientedx3  Psych: Normal affect, dress and demeanor. Normal speech. Normal thought content and judgment..      Assessment & Plan:  Javier Casey is a 60 y.o. male present for follow up on hypertension, angioedema and hyperlipidemia. 1. Angioedema, subsequent encounter - Odd History, patient seems to think that it is related to Levaquin use. He states it occurs intermittently once every few months in response to prednisone treatment. Patient is  on a ARB, but states that he was on this medication and initially started. Discussed with him the possibility of angioedema with some NSAIDs and is ARB. He still dismisses these possibilities. - Present refilled - predniSONE (DELTASONE) 10 MG tablet; Take 2T PO once, then repeat in 12 h if swelling persists  Dispense: 26 tablet; Refill: 0  2. Hyperlipidemia - Counseled patient on statin therapy considering his personal history and family history, it is indicated for him to use at least a low-dose statin to help him with some cardiovascular protection. After understanding patient is agreeable to a new medication, that will not be as expensive/covered by his insurance at low dose. - atorvastatin (LIPITOR) 10 MG tablet; Take 1 tablet (10 mg total) by mouth daily.  Dispense: 90 tablet; Refill: 3  3. HYPERTENSION, BENIGN ESSENTIAL - Stable today. Continue current regimen.  Follow-up in August for CPE with fasting labs.

## 2015-08-13 NOTE — Patient Instructions (Addendum)
I have called in atorvastatin 10 mg dose for cardiovascular protection. This medicine will give you cardiovascular protection.  Follow up after August 1, for CPE and labs.   Salicylic Acid topical for your wart  Health Maintenance, Male A healthy lifestyle and preventative care can promote health and wellness.  Maintain regular health, dental, and eye exams.  Eat a healthy diet. Foods like vegetables, fruits, whole grains, low-fat dairy products, and lean protein foods contain the nutrients you need and are low in calories. Decrease your intake of foods high in solid fats, added sugars, and salt. Get information about a proper diet from your health care provider, if necessary.  Regular physical exercise is one of the most important things you can do for your health. Most adults should get at least 150 minutes of moderate-intensity exercise (any activity that increases your heart rate and causes you to sweat) each week. In addition, most adults need muscle-strengthening exercises on 2 or more days a week.   Maintain a healthy weight. The body mass index (BMI) is a screening tool to identify possible weight problems. It provides an estimate of body fat based on height and weight. Your health care provider can find your BMI and can help you achieve or maintain a healthy weight. For males 20 years and older:  A BMI below 18.5 is considered underweight.  A BMI of 18.5 to 24.9 is normal.  A BMI of 25 to 29.9 is considered overweight.  A BMI of 30 and above is considered obese.  Maintain normal blood lipids and cholesterol by exercising and minimizing your intake of saturated fat. Eat a balanced diet with plenty of fruits and vegetables. Blood tests for lipids and cholesterol should begin at age 25 and be repeated every 5 years. If your lipid or cholesterol levels are high, you are over age 84, or you are at high risk for heart disease, you may need your cholesterol levels checked more  frequently.Ongoing high lipid and cholesterol levels should be treated with medicines if diet and exercise are not working.  If you smoke, find out from your health care provider how to quit. If you do not use tobacco, do not start.  Lung cancer screening is recommended for adults aged 95-80 years who are at high risk for developing lung cancer because of a history of smoking. A yearly low-dose CT scan of the lungs is recommended for people who have at least a 30-pack-year history of smoking and are current smokers or have quit within the past 15 years. A pack year of smoking is smoking an average of 1 pack of cigarettes a day for 1 year (for example, a 30-pack-year history of smoking could mean smoking 1 pack a day for 30 years or 2 packs a day for 15 years). Yearly screening should continue until the smoker has stopped smoking for at least 15 years. Yearly screening should be stopped for people who develop a health problem that would prevent them from having lung cancer treatment.  If you choose to drink alcohol, do not have more than 2 drinks per day. One drink is considered to be 12 oz (360 mL) of beer, 5 oz (150 mL) of wine, or 1.5 oz (45 mL) of liquor.  Avoid the use of street drugs. Do not share needles with anyone. Ask for help if you need support or instructions about stopping the use of drugs.  High blood pressure causes heart disease and increases the risk of stroke. High blood  pressure is more likely to develop in:  People who have blood pressure in the end of the normal range (100-139/85-89 mm Hg).  People who are overweight or obese.  People who are African American.  If you are 20-55 years of age, have your blood pressure checked every 3-5 years. If you are 63 years of age or older, have your blood pressure checked every year. You should have your blood pressure measured twice--once when you are at a hospital or clinic, and once when you are not at a hospital or clinic. Record the  average of the two measurements. To check your blood pressure when you are not at a hospital or clinic, you can use:  An automated blood pressure machine at a pharmacy.  A home blood pressure monitor.  If you are 86-89 years old, ask your health care provider if you should take aspirin to prevent heart disease.  Diabetes screening involves taking a blood sample to check your fasting blood sugar level. This should be done once every 3 years after age 25 if you are at a normal weight and without risk factors for diabetes. Testing should be considered at a younger age or be carried out more frequently if you are overweight and have at least 1 risk factor for diabetes.  Colorectal cancer can be detected and often prevented. Most routine colorectal cancer screening begins at the age of 84 and continues through age 85. However, your health care provider may recommend screening at an earlier age if you have risk factors for colon cancer. On a yearly basis, your health care provider may provide home test kits to check for hidden blood in the stool. A small camera at the end of a tube may be used to directly examine the colon (sigmoidoscopy or colonoscopy) to detect the earliest forms of colorectal cancer. Talk to your health care provider about this at age 70 when routine screening begins. A direct exam of the colon should be repeated every 5-10 years through age 69, unless early forms of precancerous polyps or small growths are found.  People who are at an increased risk for hepatitis B should be screened for this virus. You are considered at high risk for hepatitis B if:  You were born in a country where hepatitis B occurs often. Talk with your health care provider about which countries are considered high risk.  Your parents were born in a high-risk country and you have not received a shot to protect against hepatitis B (hepatitis B vaccine).  You have HIV or AIDS.  You use needles to inject street  drugs.  You live with, or have sex with, someone who has hepatitis B.  You are a man who has sex with other men (MSM).  You get hemodialysis treatment.  You take certain medicines for conditions like cancer, organ transplantation, and autoimmune conditions.  Hepatitis C blood testing is recommended for all people born from [birth year] through 1965 and any individual with known risk factors for hepatitis C.  Healthy men should no longer receive prostate-specific antigen (PSA) blood tests as part of routine cancer screening. Talk to your health care provider about prostate cancer screening.  Testicular cancer screening is not recommended for adolescents or adult males who have no symptoms. Screening includes self-exam, a health care provider exam, and other screening tests. Consult with your health care provider about any symptoms you have or any concerns you have about testicular cancer.  Practice safe sex. Use condoms and avoid  high-risk sexual practices to reduce the spread of sexually transmitted infections (STIs).  You should be screened for STIs, including gonorrhea and chlamydia if:  You are sexually active and are younger than 24 years.  You are older than 24 years, and your health care provider tells you that you are at risk for this type of infection.  Your sexual activity has changed since you were last screened, and you are at an increased risk for chlamydia or gonorrhea. Ask your health care provider if you are at risk.  If you are at risk of being infected with HIV, it is recommended that you take a prescription medicine daily to prevent HIV infection. This is called pre-exposure prophylaxis (PrEP). You are considered at risk if:  You are a man who has sex with other men (MSM).  You are a heterosexual man who is sexually active with multiple partners.  You take drugs by injection.  You are sexually active with a partner who has HIV.  Talk with your health care provider about  whether you are at high risk of being infected with HIV. If you choose to begin PrEP, you should first be tested for HIV. You should then be tested every 3 months for as long as you are taking PrEP.  Use sunscreen. Apply sunscreen liberally and repeatedly throughout the day. You should seek shade when your shadow is shorter than you. Protect yourself by wearing long sleeves, pants, a wide-brimmed hat, and sunglasses year round whenever you are outdoors.  Tell your health care provider of new moles or changes in moles, especially if there is a change in shape or color. Also, tell your health care provider if a mole is larger than the size of a pencil eraser.  A one-time screening for abdominal aortic aneurysm (AAA) and surgical repair of large AAAs by ultrasound is recommended for men aged 39-75 years who are current or former smokers.  Stay current with your vaccines (immunizations).   This information is not intended to replace advice given to you by your health care provider. Make sure you discuss any questions you have with your health care provider.   Document Released: 12/30/2007 Document Revised: 07/24/2014 Document Reviewed: 11/28/2010 Elsevier Interactive Patient Education [employer].

## 2016-02-15 DIAGNOSIS — N503 Cyst of epididymis: Secondary | ICD-10-CM

## 2016-02-15 HISTORY — DX: Cyst of epididymis: N50.3

## 2016-02-18 ENCOUNTER — Ambulatory Visit (INDEPENDENT_AMBULATORY_CARE_PROVIDER_SITE_OTHER): Payer: 59 | Admitting: Family Medicine

## 2016-02-18 ENCOUNTER — Encounter: Payer: Self-pay | Admitting: Family Medicine

## 2016-02-18 VITALS — BP 146/84 | HR 74 | Temp 98.4°F | Resp 20 | Ht 74.0 in | Wt 291.0 lb

## 2016-02-18 DIAGNOSIS — N509 Disorder of male genital organs, unspecified: Secondary | ICD-10-CM | POA: Diagnosis not present

## 2016-02-18 DIAGNOSIS — Z131 Encounter for screening for diabetes mellitus: Secondary | ICD-10-CM | POA: Diagnosis not present

## 2016-02-18 DIAGNOSIS — Z1159 Encounter for screening for other viral diseases: Secondary | ICD-10-CM

## 2016-02-18 DIAGNOSIS — Z1329 Encounter for screening for other suspected endocrine disorder: Secondary | ICD-10-CM

## 2016-02-18 DIAGNOSIS — I10 Essential (primary) hypertension: Secondary | ICD-10-CM

## 2016-02-18 DIAGNOSIS — Z6837 Body mass index (BMI) 37.0-37.9, adult: Secondary | ICD-10-CM

## 2016-02-18 DIAGNOSIS — E785 Hyperlipidemia, unspecified: Secondary | ICD-10-CM

## 2016-02-18 DIAGNOSIS — N5089 Other specified disorders of the male genital organs: Secondary | ICD-10-CM

## 2016-02-18 DIAGNOSIS — Z23 Encounter for immunization: Secondary | ICD-10-CM

## 2016-02-18 DIAGNOSIS — Z125 Encounter for screening for malignant neoplasm of prostate: Secondary | ICD-10-CM

## 2016-02-18 DIAGNOSIS — Z130 Encounter for screening for diseases of the blood and blood-forming organs and certain disorders involving the immune mechanism: Secondary | ICD-10-CM | POA: Diagnosis not present

## 2016-02-18 DIAGNOSIS — Z0001 Encounter for general adult medical examination with abnormal findings: Secondary | ICD-10-CM

## 2016-02-18 DIAGNOSIS — R6889 Other general symptoms and signs: Secondary | ICD-10-CM

## 2016-02-18 LAB — CBC WITH DIFFERENTIAL/PLATELET
BASOS ABS: 62 {cells}/uL (ref 0–200)
Basophils Relative: 1 %
EOS PCT: 3 %
Eosinophils Absolute: 186 cells/uL (ref 15–500)
HCT: 43.9 % (ref 38.5–50.0)
Hemoglobin: 14.6 g/dL (ref 13.2–17.1)
Lymphocytes Relative: 12 %
Lymphs Abs: 744 cells/uL — ABNORMAL LOW (ref 850–3900)
MCH: 28.6 pg (ref 27.0–33.0)
MCHC: 33.3 g/dL (ref 32.0–36.0)
MCV: 86.1 fL (ref 80.0–100.0)
MONOS PCT: 12 %
MPV: 11.4 fL (ref 7.5–12.5)
Monocytes Absolute: 744 cells/uL (ref 200–950)
NEUTROS PCT: 72 %
Neutro Abs: 4464 cells/uL (ref 1500–7800)
PLATELETS: 161 10*3/uL (ref 140–400)
RBC: 5.1 MIL/uL (ref 4.20–5.80)
RDW: 14.8 % (ref 11.0–15.0)
WBC: 6.2 10*3/uL (ref 3.8–10.8)

## 2016-02-18 LAB — COMPREHENSIVE METABOLIC PANEL
ALBUMIN: 4.1 g/dL (ref 3.6–5.1)
ALK PHOS: 49 U/L (ref 40–115)
ALT: 14 U/L (ref 9–46)
AST: 11 U/L (ref 10–35)
BILIRUBIN TOTAL: 0.9 mg/dL (ref 0.2–1.2)
BUN: 16 mg/dL (ref 7–25)
CALCIUM: 9.2 mg/dL (ref 8.6–10.3)
CO2: 20 mmol/L (ref 20–31)
CREATININE: 1.39 mg/dL — AB (ref 0.70–1.25)
Chloride: 104 mmol/L (ref 98–110)
GLUCOSE: 79 mg/dL (ref 65–99)
POTASSIUM: 3.9 mmol/L (ref 3.5–5.3)
Sodium: 138 mmol/L (ref 135–146)
TOTAL PROTEIN: 6.6 g/dL (ref 6.1–8.1)

## 2016-02-18 LAB — TSH: TSH: 2.11 mIU/L (ref 0.40–4.50)

## 2016-02-18 LAB — HEMOGLOBIN A1C
Hgb A1c MFr Bld: 5.8 % — ABNORMAL HIGH (ref <5.7)
Mean Plasma Glucose: 120 mg/dL

## 2016-02-18 LAB — LIPID PANEL
CHOL/HDL RATIO: 5.5 ratio — AB (ref <=5.0)
CHOLESTEROL: 218 mg/dL — AB (ref 125–200)
HDL: 40 mg/dL (ref >=40)
LDL CALC: 140 mg/dL — AB (ref <130)
TRIGLYCERIDES: 191 mg/dL — AB (ref <150)
VLDL: 38 mg/dL — AB (ref <30)

## 2016-02-18 MED ORDER — HYDROCHLOROTHIAZIDE 12.5 MG PO CAPS: 12.5000 mg | ORAL_CAPSULE | Freq: Every day | ORAL | 1 refills | Status: DC

## 2016-02-18 MED ORDER — ZOSTER VACCINE LIVE 19400 UNT/0.65ML ~~LOC~~ SUSR: 0.6500 mL | Freq: Once | SUBCUTANEOUS | 0 refills | Status: AC

## 2016-02-18 MED ORDER — LOSARTAN POTASSIUM-HCTZ 50-12.5 MG PO TABS: 1.0000 | ORAL_TABLET | Freq: Every day | ORAL | 1 refills | Status: DC

## 2016-02-18 NOTE — Patient Instructions (Signed)

## 2016-02-18 NOTE — Progress Notes (Signed)
Patient ID: Javier Casey, male  DOB: 07-27-1955, 60 y.o.   MRN: [id number] Patient Care Team    Relationship Specialty Notifications Start End  Ma Hillock, DO PCP - General Family Medicine  08/13/15     Subjective:  Javier Casey is a 60 y.o. male present for CPE. All past medical history, surgical history, allergies, family history, immunizations, medications and social history were obtained in the electronic medical record today. All recent labs, ED visits and hospitalizations within the last year were reviewed.  Groin pain: She endorses scrotal pain of approximately 3-4 months duration. He states the pain is in his right scrotal area and is intermittent. He endorses feeling a small mass and mild discomfort in the area and palpated. He denies current wound, drainage, night sweats, redness, changes in urination/stream, fever, chills or changes in sexual partner. He states that his wife has a new dog, which is a large breed dog, and has jumped up on his groin on a few occasions and wonders if it's possibly trauma related or "cancer".   Health maintenance:  Colonoscopy: 2016, polyps and diverticulosis. Follow up 3 years.  Immunizations:  tdap 2017, influenza 2015,  zostavax script today.  Infectious disease screening: HIV completed, Hep C completed today. PSA:  Lab Results  Component Value Date   PSA 0.83 02/26/2008  , pt was counseled on prostate cancer screenings.  Assistive device: None  Oxygen [id number] Patient has a Dental home. Hospitalizations/ED visits:  Immunization History  Administered Date(s) Administered  . Influenza,inj,Quad PF,36+ Mos 04/30/2013, 04/13/2014  . Pneumococcal Conjugate-13 04/13/2014  . Td 02/14/2006  . Tdap 01/31/2016     Past Medical History:  Diagnosis Date  . Allergy    swelling lips ,nose, hands and feet, tongue, but unknown reason  . Asthma   . GERD (gastroesophageal reflux disease)   . Hyperlipidemia   . Hypertension   .  Tubular adenoma of colon 2009   Allergies  Allergen Reactions  . Ciprofloxacin     Severe swelling and rash  . Levofloxacin     Diffuse swelling with angioedema   Past Surgical History:  Procedure Laterality Date  . APPENDECTOMY    . colonoscopy with polypectomy  2009   [facility]  . WISDOM TOOTH EXTRACTION     Family History  Problem Relation Age of Onset  . Coronary artery disease Mother 33    died post CBAG  . Cancer Neg Hx   . Stroke Neg Hx   . Diabetes Neg Hx   . Colon cancer Neg Hx   . Allergic Disorder Daughter   . Allergic Disorder Son    Social History   Social History  . Marital status: Married    Spouse name: N/A  . Number of children: N/A  . Years of education: N/A   Occupational History  . Not on file.   Social History Main Topics  . Smoking status: Never Smoker  . Smokeless tobacco: Never Used  . Alcohol use 1.8 oz/week    3 Cans of beer per week     Comment: < 8 glasses / week of wine  . Drug use: No  . Sexual activity: Yes   Other Topics Concern  . Not on file   Social History Narrative  . No narrative on file     Medication List       Accurate as of 02/18/16  3:35 PM. Always use your most recent med list.  atorvastatin 10 MG tablet Commonly known as:  LIPITOR Take 1 tablet (10 mg total) by mouth daily.   B-12 2500 MCG Tabs Take by mouth daily.   losartan-hydrochlorothiazide 50-12.5 MG tablet Commonly known as:  HYZAAR Take 1 tablet by mouth daily.   predniSONE 10 MG tablet Commonly known as:  DELTASONE Take 2T PO once, then repeat in 12 h if swelling persists   ranitidine 150 MG tablet Commonly known as:  ZANTAC Take 150 mg by mouth as needed.   Vitamin D3 10000 units capsule Take 10,000 Units by mouth daily.        No results found for this or any previous visit (from the past 2160 hour(s)).  No results found.   ROS: 14 pt review of systems performed and negative (unless mentioned in an  HPI)  Objective: BP (!) 146/84 (BP Location: Left Arm, Patient Position: Sitting, Cuff Size: Large)   Pulse 74   Temp 98.4 F (36.9 C) (Oral)   Resp 20   Ht 6\' 2"  (1.88 m)   Wt 291 lb (132 kg)   SpO2 96%   BMI 37.36 kg/m  Gen: Afebrile. No acute distress. Nontoxic in appearance, well-developed, well-nourished,  Obese, quiet Caucasian male. HENT: AT. [place]. Bilateral TM visualized and normal in appearance, normal external auditory canal. MMM, no oral lesions, adequate dentition. Bilateral nares within normal limits. Throat without erythema, ulcerations or exudates. No Cough on exam, no hoarseness on exam. Eyes:Pupils Equal Round Reactive to light, Extraocular movements intact,  Conjunctiva without redness, discharge or icterus. Neck/lymp/endocrine: Supple, no lymphadenopathy, no thyromegaly CV: RRR no murmur, +1 edema, +2/4 P posterior tibialis pulses. No JVD. Chest: CTAB, no wheeze, rhonchi or crackles. Normal Respiratory effort. Good Air movement. Abd: Soft. Obese. NTND. BS present. No Masses palpated. No hepatosplenomegaly. No rebound tenderness or guarding. Skin: No rashes, purpura or petechiae. Warm and well-perfused. Skin intact. Neuro/Msk:  Normal gait. PERLA. EOMi. Alert. Oriented x3.  Cranial nerves II through XII intact. Muscle strength 5/5 upper/lower extremity. DTRs equal bilaterally. Psych: Normal affect, dress and demeanor. Normal speech. Normal thought content and judgment. GU: No erythema, no soft tissue swelling, mild enlargement of right scrotal sac, very mild tenderness to palpation posterior lateral scrotum, with small mass. Skin intact. No inguinal lymphadenopathy palpable.  Assessment/plan: Javier Casey is a 60 y.o. male present for CPE, with concerns over small new mass in his right scrotal area. Encounter for preventative visit with abnormal findings Screening for deficiency anemia/thyroid disorder screening/diabetes screening/prostate screening/hepatitis C  screening/BMI 37.0-37.9, adult Colonoscopy: 2016, polyps and diverticulosis. Follow up 3 years with Dr. Hilarie Fredrickson.   Immunizations:  tdap 2017, influenza 2015,  zostavax script today.  Infectious disease screening: HIV completed, Hep C completed today. Patient was encouraged to exercise greater than 150 minutes a week. Patient was encouraged to choose a diet filled with fresh fruits and vegetables, and lean meats. - CBC w/Diff - TSH - HgB A1c - PSA - Hepatitis C Antibody   Hyperlipidemia/hypertension - Elevated blood pressure reading today. Along with +1 edema lower extremities. Increased blood pressure medicine by adding 12.5 HCTZ to his regimen. Refills on losartan/HCTZ 50-12 0.5 provided today. - Continue atorvastatin. - Lipid panel - Low salt diet. Increase exercise to > 150 minutes a week.  - Follow-up 2-4 weeks for checkup on hypertension since increasing medication. Patient is to monitor his blood pressure in the outpatient setting.  Scrotal mass - Small mass right posterior inferior lateral scrotum. Does not seem to  be in appropriate location to be epididymal. He has endorsed mild trauma with his dog jumping on him. Will obtain scrotal ultrasound for reassurance. - US Scrotum; Future  AVS provided to patient today for education/recommendation on gender specific health and safety maintenance.  Patient to continue routine follow-ups on chronic medical conditions every 6 months, CPE in one year. Electronically signed by: Howard Pouch, DO [place]

## 2016-02-19 LAB — HEPATITIS C ANTIBODY: HCV AB: NEGATIVE

## 2016-02-19 LAB — PSA: PSA: 1.22 ng/mL (ref <=4.00)

## 2016-02-21 ENCOUNTER — Telehealth: Payer: Self-pay | Admitting: Family Medicine

## 2016-02-21 ENCOUNTER — Other Ambulatory Visit: Payer: Self-pay | Admitting: Family Medicine

## 2016-02-21 NOTE — Telephone Encounter (Signed)
Please call pt: - his las are all stable/normal with the exception of his cholesterol. His numbers have increased a good deal and he should start taking statin daily again. Diet and exercise modifications encouraged. >150 minutes a week exercise, less saturated fat/fructose, increase fiber. Ordered scrotal US- he should be contacted to schedule once approved  Follow-up 2-4 weeks for checkup on hypertension since increasing medication.   Lipid Panel     Component Value Date/Time   CHOL 218 (H) 02/18/2016 1617   TRIG 191 (H) 02/18/2016 1617   HDL 40 02/18/2016 1617   CHOLHDL 5.5 (H) 02/18/2016 1617   VLDL 38 (H) 02/18/2016 1617   LDLCALC 140 (H) 02/18/2016 1617   LDLDIRECT 170.4 08/01/2013 [id number]

## 2016-02-22 ENCOUNTER — Telehealth: Payer: Self-pay | Admitting: *Deleted

## 2016-02-22 ENCOUNTER — Other Ambulatory Visit: Payer: Self-pay | Admitting: Family Medicine

## 2016-02-22 DIAGNOSIS — N5089 Other specified disorders of the male genital organs: Secondary | ICD-10-CM

## 2016-02-22 NOTE — Telephone Encounter (Signed)
Spoke with patient reviewed results and instructions. Patient verbalized understanding. Patient will call back to schedule follow up appt for BP.

## 2016-02-22 NOTE — Telephone Encounter (Signed)
[id number] added per Dr Raoul Pitch.

## 2016-02-28 ENCOUNTER — Ambulatory Visit (HOSPITAL_BASED_OUTPATIENT_CLINIC_OR_DEPARTMENT_OTHER)
Admission: RE | Admit: 2016-02-28 | Discharge: 2016-02-28 | Disposition: A | Discharge status: Home / Self Care | Payer: 59 | Source: Ambulatory Visit | Attending: Family Medicine | Admitting: Family Medicine

## 2016-02-28 DIAGNOSIS — N433 Hydrocele, unspecified: Secondary | ICD-10-CM | POA: Insufficient documentation

## 2016-02-28 DIAGNOSIS — N503 Cyst of epididymis: Secondary | ICD-10-CM | POA: Insufficient documentation

## 2016-02-28 DIAGNOSIS — N5089 Other specified disorders of the male genital organs: Secondary | ICD-10-CM

## 2016-02-28 DIAGNOSIS — N509 Disorder of male genital organs, unspecified: Secondary | ICD-10-CM | POA: Diagnosis not present

## 2016-02-28 DIAGNOSIS — N442 Benign cyst of testis: Secondary | ICD-10-CM | POA: Insufficient documentation

## 2016-02-29 ENCOUNTER — Telehealth: Payer: Self-pay | Admitting: Family Medicine

## 2016-02-29 NOTE — Telephone Encounter (Signed)
Patient has had [place]. Please call his cell when results are available.

## 2016-03-01 ENCOUNTER — Encounter: Payer: Self-pay | Admitting: Family Medicine

## 2016-03-01 NOTE — Telephone Encounter (Signed)
Patient aware of results and states he is good with your opinion and does not want to see a specialist.

## 2016-03-01 NOTE — Telephone Encounter (Signed)
Scrotal ultrasound showed multiple small cysts on the part of the testicle called the epididymus. These are common and are NOT cancer.  Usually they are not the cause of any pain or discomfort. Reassure pt that nothing further needs to be done at this time, but if he wants specialist opinion we can refer him to a urologist.  Let me know-thx

## 2016-08-08 ENCOUNTER — Encounter: Payer: Self-pay | Admitting: *Deleted

## 2016-08-08 ENCOUNTER — Other Ambulatory Visit: Payer: Self-pay | Admitting: Family Medicine

## 2016-08-08 DIAGNOSIS — I10 Essential (primary) hypertension: Secondary | ICD-10-CM

## 2016-09-06 ENCOUNTER — Ambulatory Visit (INDEPENDENT_AMBULATORY_CARE_PROVIDER_SITE_OTHER): Payer: BLUE CROSS/BLUE SHIELD | Admitting: Family Medicine

## 2016-09-06 ENCOUNTER — Encounter: Payer: Self-pay | Admitting: Family Medicine

## 2016-09-06 VITALS — BP 145/83 | HR 91 | Temp 98.6°F | Resp 20 | Wt 292.8 lb

## 2016-09-06 DIAGNOSIS — I10 Essential (primary) hypertension: Secondary | ICD-10-CM | POA: Diagnosis not present

## 2016-09-06 DIAGNOSIS — Z79899 Other long term (current) drug therapy: Secondary | ICD-10-CM | POA: Diagnosis not present

## 2016-09-06 DIAGNOSIS — E785 Hyperlipidemia, unspecified: Secondary | ICD-10-CM | POA: Diagnosis not present

## 2016-09-06 LAB — BASIC METABOLIC PANEL WITH GFR
BUN: 20 mg/dL (ref 7–25)
CALCIUM: 10.1 mg/dL (ref 8.6–10.3)
CHLORIDE: 102 mmol/L (ref 98–110)
CO2: 23 mmol/L (ref 20–31)
CREATININE: 1.65 mg/dL — AB (ref 0.70–1.25)
GFR, Est African American: 51 mL/min — ABNORMAL LOW (ref >=60)
GFR, Est Non African American: 44 mL/min — ABNORMAL LOW (ref >=60)
Glucose, Bld: 97 mg/dL (ref 65–99)
Potassium: 3.8 mmol/L (ref 3.5–5.3)
Sodium: 140 mmol/L (ref 135–146)

## 2016-09-06 NOTE — Patient Instructions (Signed)
Start to take an additional HCTZ 12.5 mg tab.   Monitor BP at least 2 hours after taking medication.  If doing well, not dizzy or having lower than 120/60 BP then we will increase the HCTZ and I will call in the HCTZ 25 mg dose with the refills (so still will take one tab)   Low  salt diet. Try to get back to walking once you are up to it.  Follow up 6 months as long as BP well controlled.

## 2016-09-06 NOTE — Progress Notes (Signed)
Patient ID: Javier Casey, male  DOB: [birth year], 61 y.o.   MRN: [id number] Patient Care Team    Relationship Specialty Notifications Start End  Ma Hillock, DO PCP - General Family Medicine  08/13/15   Jerene Bears, MD Consulting Physician Gastroenterology  02/18/16     Subjective:  Javier Casey is a 61 y.o. male present for HTN followup  HTN/HLD: Pt reports compliance with hyzaar 50-12.5 QD and Lipitor 10 QD. Blood pressures ranges at home 130-140/80s. Patient denies chest pain, shortness of breath. He has lower extremity edema.  BMP: 02/18/2016 with Cr 1.39, GFR 55 CBC: 02/18/2016 WNL Diet: He watches the salt content Exercise: he tries to walk, but has not been so go at it since its been winter.  RF: HLD  (collected 02/2016). Obesity. [place]- CAD/CABG   Immunization History  Administered Date(s) Administered  . Influenza,inj,Quad PF,36+ Mos 04/30/2013, 04/13/2014  . Pneumococcal Conjugate-13 04/13/2014  . Td 02/14/2006  . Tdap 01/31/2016     Past Medical History:  Diagnosis Date  . Allergy    swelling lips ,nose, hands and feet, tongue, but unknown reason  . Asthma   . Epididymal cyst 02/2016   Multiple on scrotal u/s  . GERD (gastroesophageal reflux disease)   . Hyperlipidemia   . Hypertension   . Tubular adenoma of colon 2009   Allergies  Allergen Reactions  . Ciprofloxacin     Severe swelling and rash  . Levofloxacin     Diffuse swelling with angioedema   Past Surgical History:  Procedure Laterality Date  . APPENDECTOMY    . colonoscopy with polypectomy  2009   [facility]  . WISDOM TOOTH EXTRACTION     Family History  Problem Relation Age of Onset  . Coronary artery disease Mother 81    died post CBAG  . Allergic Disorder Daughter   . Allergic Disorder Son   . Cancer Neg Hx   . Stroke Neg Hx   . Diabetes Neg Hx   . Colon cancer Neg Hx    Social History   Social History  . Marital status: Married    Spouse name: N/A  . Number of children:  N/A  . Years of education: N/A   Occupational History  . Not on file.   Social History Main Topics  . Smoking status: Never Smoker  . Smokeless tobacco: Never Used  . Alcohol use 1.8 oz/week    3 Cans of beer per week     Comment: < 8 glasses / week of wine  . Drug use: No  . Sexual activity: Yes   Other Topics Concern  . Not on file   Social History Narrative  . No narrative on file   Allergies as of 09/06/2016      Reactions   Ciprofloxacin    Severe swelling and rash   Levofloxacin    Diffuse swelling with angioedema      Medication List       Accurate as of 09/06/16  3:30 PM. Always use your most recent med list.          atorvastatin 10 MG tablet Commonly known as:  LIPITOR Take 1 tablet (10 mg total) by mouth daily.   B-12 2500 MCG Tabs Take by mouth daily.   hydrochlorothiazide 12.5 MG capsule Commonly known as:  MICROZIDE TAKE 1 CAPSULE (12.5 MG TOTAL) BY MOUTH DAILY.   losartan-hydrochlorothiazide 50-12.5 MG tablet Commonly known as:  HYZAAR TAKE  1 TABLET BY MOUTH DAILY.   predniSONE 10 MG tablet Commonly known as:  DELTASONE Take 2T PO once, then repeat in 12 h if swelling persists   ranitidine 150 MG tablet Commonly known as:  ZANTAC Take 150 mg by mouth as needed.   Vitamin D3 10000 units capsule Take 10,000 Units by mouth daily.        No results found for this or any previous visit (from the past 2160 hour(s)).  No results found.   ROS: 14 pt review of systems performed and negative (unless mentioned in an HPI)  Objective: BP (!) 145/83 (BP Location: Right Arm, Patient Position: Sitting, Cuff Size: Large)   Pulse 91   Temp 98.6 F (37 C)   Resp 20   Wt 292 lb 12 oz (132.8 kg)   SpO2 98%   BMI 37.59 kg/m   Gen: Afebrile. No acute distress. pleasant caucasian male. Obese. HENT: AT. [place].MMM.  Eyes:Pupils Equal Round Reactive to light, Extraocular movements intact,  Conjunctiva without redness, discharge or icterus. CV:  RRR, +1 pitting edema, +2/4 P posterior tibialis pulses Chest: CTAB, no wheeze or crackles Abd: Soft. obese. NTND. BS present.  Neuro:  Normal gait. PERLA. EOMi. Alert. Oriented.  Assessment/plan: [name] is a 61 y.o. male present for HTN follow up. Hyperlipidemia/hypertension - stable today, could use a little bit of improvement. He still has some LE edema. Discussed increasing the extra HCTZ to 25 mg QD, continue Hyzaar at current dose. He will call in with blood pressure readings on this dose and if tolerating higher dose with call in refills for lipitor, hyzaar and HCTZ 25 mg QD. If blood pressure will call in refills on other meds, but keep HCTZ to 12.5 mg.  - Continue atorvastatin. - Low salt diet. Increase exercise to > 150 minutes a week.  - BMP today, check every 6 months.  - F/U 6 months if BP controlled on current dose.    Electronically signed by: Howard Pouch, DO [place]

## 2016-09-07 ENCOUNTER — Telehealth: Payer: Self-pay | Admitting: Family Medicine

## 2016-09-07 DIAGNOSIS — E7849 Other hyperlipidemia: Secondary | ICD-10-CM

## 2016-09-07 DIAGNOSIS — N183 Chronic kidney disease, stage 3 unspecified: Secondary | ICD-10-CM

## 2016-09-07 DIAGNOSIS — I10 Essential (primary) hypertension: Secondary | ICD-10-CM

## 2016-09-07 NOTE — Telephone Encounter (Signed)
Spoke with patient reviewed results and instructions. Patient states he did double his BP medication as directed and his BP today is 126/77.

## 2016-09-07 NOTE — Telephone Encounter (Signed)
Please call pt: - his kidney function is mildly decreased from his normal., not bad though. This could be because he has been sick. I encourage him to drink plenty of water to flush the kidneys and will retest on his next appt.

## 2016-09-08 MED ORDER — LOSARTAN POTASSIUM-HCTZ 50-12.5 MG PO TABS: 1.0000 | ORAL_TABLET | Freq: Every day | ORAL | 1 refills | Status: DC

## 2016-09-08 MED ORDER — HYDROCHLOROTHIAZIDE 25 MG PO TABS: 25.0000 mg | ORAL_TABLET | Freq: Every day | ORAL | 1 refills | Status: DC

## 2016-09-08 MED ORDER — ATORVASTATIN CALCIUM 10 MG PO TABS: 10.0000 mg | ORAL_TABLET | Freq: Every day | ORAL | 3 refills | Status: DC

## 2016-09-08 NOTE — Telephone Encounter (Signed)
Great. That is perfect. I have called in his refills as we discussed and the new HCTZ tab is the 25 mg (so just take 1)

## 2016-09-08 NOTE — Telephone Encounter (Signed)
Spoke with patient reviewed instructions.patiuent verbalized understanding.

## 2017-02-23 ENCOUNTER — Encounter: Payer: Self-pay | Admitting: Family Medicine

## 2017-02-23 ENCOUNTER — Ambulatory Visit (INDEPENDENT_AMBULATORY_CARE_PROVIDER_SITE_OTHER): Payer: BLUE CROSS/BLUE SHIELD | Admitting: Family Medicine

## 2017-02-23 VITALS — BP 117/78 | HR 75 | Temp 98.4°F | Resp 20 | Ht 74.0 in | Wt 288.5 lb

## 2017-02-23 DIAGNOSIS — I10 Essential (primary) hypertension: Secondary | ICD-10-CM

## 2017-02-23 DIAGNOSIS — Z0001 Encounter for general adult medical examination with abnormal findings: Secondary | ICD-10-CM

## 2017-02-23 DIAGNOSIS — Z6837 Body mass index (BMI) 37.0-37.9, adult: Secondary | ICD-10-CM | POA: Diagnosis not present

## 2017-02-23 DIAGNOSIS — E785 Hyperlipidemia, unspecified: Secondary | ICD-10-CM

## 2017-02-23 DIAGNOSIS — N183 Chronic kidney disease, stage 3 unspecified: Secondary | ICD-10-CM

## 2017-02-23 LAB — COMPREHENSIVE METABOLIC PANEL
ALBUMIN: 4.5 g/dL (ref 3.5–5.2)
ALT: 17 U/L (ref 0–53)
AST: 13 U/L (ref 0–37)
Alkaline Phosphatase: 51 U/L (ref 39–117)
BUN: 18 mg/dL (ref 6–23)
CHLORIDE: 100 meq/L (ref 96–112)
CO2: 30 mEq/L (ref 19–32)
Calcium: 9.7 mg/dL (ref 8.4–10.5)
Creatinine, Ser: 1.38 mg/dL (ref 0.40–1.50)
GFR: 55.59 mL/min — ABNORMAL LOW (ref >60.00)
GLUCOSE: 82 mg/dL (ref 70–99)
POTASSIUM: 3.6 meq/L (ref 3.5–5.1)
SODIUM: 137 meq/L (ref 135–145)
Total Bilirubin: 0.8 mg/dL (ref 0.2–1.2)
Total Protein: 7 g/dL (ref 6.0–8.3)

## 2017-02-23 LAB — CBC WITH DIFFERENTIAL/PLATELET
BASOS PCT: 1.5 % (ref 0.0–3.0)
Basophils Absolute: 0.1 10*3/uL (ref 0.0–0.1)
EOS PCT: 3.2 % (ref 0.0–5.0)
Eosinophils Absolute: 0.2 10*3/uL (ref 0.0–0.7)
HCT: 45.1 % (ref 39.0–52.0)
Hemoglobin: 14.8 g/dL (ref 13.0–17.0)
LYMPHS ABS: 1.7 10*3/uL (ref 0.7–4.0)
Lymphocytes Relative: 25.2 % (ref 12.0–46.0)
MCHC: 32.9 g/dL (ref 30.0–36.0)
MCV: 87.9 fl (ref 78.0–100.0)
MONO ABS: 0.7 10*3/uL (ref 0.1–1.0)
Monocytes Relative: 10.4 % (ref 3.0–12.0)
NEUTROS ABS: 4 10*3/uL (ref 1.4–7.7)
NEUTROS PCT: 59.7 % (ref 43.0–77.0)
PLATELETS: 201 10*3/uL (ref 150.0–400.0)
RBC: 5.13 Mil/uL (ref 4.22–5.81)
RDW: 15.3 % (ref 11.5–15.5)
WBC: 6.6 10*3/uL (ref 4.0–10.5)

## 2017-02-23 LAB — HEMOGLOBIN A1C: Hgb A1c MFr Bld: 6 % (ref 4.6–6.5)

## 2017-02-23 LAB — LIPID PANEL
CHOL/HDL RATIO: 6
Cholesterol: 237 mg/dL — ABNORMAL HIGH (ref 0–200)
HDL: 38 mg/dL — ABNORMAL LOW (ref >39.00)
NonHDL: 198.96
Triglycerides: 210 mg/dL — ABNORMAL HIGH (ref 0.0–149.0)
VLDL: 42 mg/dL — AB (ref 0.0–40.0)

## 2017-02-23 LAB — LDL CHOLESTEROL, DIRECT: LDL DIRECT: 159 mg/dL

## 2017-02-23 MED ORDER — LOSARTAN POTASSIUM-HCTZ 50-12.5 MG PO TABS: 1.0000 | ORAL_TABLET | Freq: Every day | ORAL | 1 refills | Status: DC

## 2017-02-23 MED ORDER — HYDROCHLOROTHIAZIDE 25 MG PO TABS: 25.0000 mg | ORAL_TABLET | Freq: Every day | ORAL | 1 refills | Status: DC

## 2017-02-23 NOTE — Patient Instructions (Signed)

## 2017-02-23 NOTE — Progress Notes (Signed)
Patient ID: Javier Casey, male  DOB: 09-04-1955, 61 y.o.   MRN: [id number] Patient Care Team    Relationship Specialty Notifications Start End  Ma Hillock, DO PCP - General Family Medicine  08/13/15   Pyrtle, Lajuan Lines, MD Consulting Physician Gastroenterology  02/18/16     Chief Complaint  Patient presents with  . Annual Exam    Subjective:  Javier Casey is a 61 y.o. male present for CPE. All past medical history, surgical history, allergies, family history, immunizations, medications and social history were updated in the electronic medical record today. All recent labs, ED visits and hospitalizations within the last year were reviewed.  He stopped the Lipitor secondary to myalgia. This was added to his allergy list. He is taking fish oil, he is uncertain the concentration.   Health maintenance: updated today 02/23/2017 Colonoscopy: 2016, polyps and diverticulosis. Follow up 3 years. Dr. Hilarie Fredrickson. No fhx. Immunizations:  tdap 2017, influenza 2015 (encouraged yearly), prevnar 2015. Infectious disease screening: HIV and  Hep C completed PSA: No fhx. Declined today Lab Results  Component Value Date   PSA 1.22 02/18/2016   PSA 0.83 02/26/2008  , pt was counseled on prostate cancer screenings.  Assistive device: None Oxygen use: none Patient has a Dental home. Hospitalizations/ED visits: none  Depression screen [facility] 2/9 02/23/2017 02/18/2016  Decreased Interest 0 0  Down, Depressed, Hopeless 0 0  PHQ - 2 Score 0 0   No flowsheet data found.   Current Exercise Habits: Home exercise routine, Type of exercise: walking, Time (Minutes): 60, Frequency (Times/Week): 7, Weekly Exercise (Minutes/Week): 420, Intensity: Mild   Fall Risk  02/23/2017 02/18/2016  Falls in the past year? No No      Immunization History  Administered Date(s) Administered  . Influenza,inj,Quad PF,36+ Mos 04/30/2013, 04/13/2014  . Pneumococcal Conjugate-13 04/13/2014  . Td 02/14/2006  . Tdap  01/31/2016     Past Medical History:  Diagnosis Date  . Allergy    swelling lips ,nose, hands and feet, tongue, but unknown reason  . Asthma   . Colon polyps   . Diverticulosis   . Epididymal cyst 02/2016   Multiple on scrotal u/s  . GERD (gastroesophageal reflux disease)   . Hyperlipidemia   . Hypertension   . Hyperuricemia   . Tubular adenoma of colon 2009   Allergies  Allergen Reactions  . Ciprofloxacin     Severe swelling and rash  . Levofloxacin     Diffuse swelling with angioedema  . Lipitor [Atorvastatin] Other (See Comments)    Myalgia    Past Surgical History:  Procedure Laterality Date  . APPENDECTOMY    . colonoscopy with polypectomy  2009   [facility]  . WISDOM TOOTH EXTRACTION     Family History  Problem Relation Age of Onset  . Coronary artery disease Mother 70       died post CBAG  . Allergic Disorder Daughter   . Allergic Disorder Son   . Cancer Neg Hx   . Stroke Neg Hx   . Diabetes Neg Hx   . Colon cancer Neg Hx    Social History   Social History  . Marital status: Married    Spouse name: N/A  . Number of children: N/A  . Years of education: N/A   Occupational History  . Not on file.   Social History Main Topics  . Smoking status: Never Smoker  . Smokeless tobacco: Never Used  . Alcohol  use 1.8 oz/week    3 Cans of beer per week     Comment: < 8 glasses / week of wine  . Drug use: No  . Sexual activity: Yes   Other Topics Concern  . Not on file   Social History Narrative  . No narrative on file   Allergies as of 02/23/2017      Reactions   Ciprofloxacin    Severe swelling and rash   Levofloxacin    Diffuse swelling with angioedema   Lipitor [atorvastatin] Other (See Comments)   Myalgia       Medication List       Accurate as of 02/23/17  3:51 PM. Always use your most recent med list.          B-12 2500 MCG Tabs Take by mouth daily.   hydrochlorothiazide 25 MG tablet Commonly known as:  HYDRODIURIL Take 1  tablet (25 mg total) by mouth daily.   losartan-hydrochlorothiazide 50-12.5 MG tablet Commonly known as:  HYZAAR Take 1 tablet by mouth daily.   predniSONE 10 MG tablet Commonly known as:  DELTASONE Take 2T PO once, then repeat in 12 h if swelling persists   ranitidine 150 MG tablet Commonly known as:  ZANTAC Take 150 mg by mouth as needed.   Vitamin D3 10000 units capsule Take 10,000 Units by mouth daily.      All past medical history, surgical history, allergies, family history, immunizations andmedications were updated in the EMR today and reviewed under the history and medication portions of their EMR.     No results found for this or any previous visit (from the past 2160 hour(s)).  US Scrotum  Result Date: 02/29/2016 CLINICAL DATA:  Right-sided testicular pain for 2 months, initial encounter EXAM: SCROTAL ULTRASOUND DOPPLER ULTRASOUND OF THE TESTICLES TECHNIQUE: Complete ultrasound examination of the testicles, epididymis, and other scrotal structures was performed. Color and spectral Doppler ultrasound were also utilized to evaluate blood flow to the testicles. COMPARISON:  None. FINDINGS: Right testicle Measurements: 3.6 x 2.5 x 3.0 cm. Multiple cysts are noted at the periphery of the testicle. The largest of these measures 7 mm. This may correspond with the patient's palpable abnormality. Left testicle Measurements: 4.0 x 1.8 x 2.3 cm. A small peripheral 2 mm cyst is noted. Some ectasia of the read testis is noted as well. Right epididymis: Scattered cysts are noted. The largest of these measures 5 mm. Left epididymis:  5 mm cyst is noted. Hydrocele:  Small right hydrocele is noted. Varicocele:  None visualized. Pulsed Doppler interrogation of both testes demonstrates normal low resistance arterial and venous waveforms bilaterally. IMPRESSION: Bilateral testicular and epididymal cysts. A small right hydrocele is noted as well. Electronically Signed   By: Inez Catalina M.D.   On:  02/29/2016 08:21   [place] Art/ven Flow Abd Pelv Doppler  Result Date: 02/29/2016 CLINICAL DATA:  Right-sided testicular pain for 2 months, initial encounter EXAM: SCROTAL ULTRASOUND DOPPLER ULTRASOUND OF THE TESTICLES TECHNIQUE: Complete ultrasound examination of the testicles, epididymis, and other scrotal structures was performed. Color and spectral Doppler ultrasound were also utilized to evaluate blood flow to the testicles. COMPARISON:  None. FINDINGS: Right testicle Measurements: 3.6 x 2.5 x 3.0 cm. Multiple cysts are noted at the periphery of the testicle. The largest of these measures 7 mm. This may correspond with the patient's palpable abnormality. Left testicle Measurements: 4.0 x 1.8 x 2.3 cm. A small peripheral 2 mm cyst is noted. Some ectasia of the  read testis is noted as well. Right epididymis: Scattered cysts are noted. The largest of these measures 5 mm. Left epididymis:  5 mm cyst is noted. Hydrocele:  Small right hydrocele is noted. Varicocele:  None visualized. Pulsed Doppler interrogation of both testes demonstrates normal low resistance arterial and venous waveforms bilaterally. IMPRESSION: Bilateral testicular and epididymal cysts. A small right hydrocele is noted as well. Electronically Signed   By: Inez Catalina M.D.   On: 02/29/2016 08:21     ROS: 14 pt review of systems performed and negative (unless mentioned in an HPI)  Objective: BP 117/78 (BP Location: Left Arm, Patient Position: Sitting, Cuff Size: Large)   Pulse 75   Temp 98.4 F (36.9 C)   Resp 20   Ht '6\' 2"'  (1.88 m)   Wt 288 lb 8 oz (130.9 kg)   SpO2 98%   BMI 37.04 kg/m  Gen: Afebrile. No acute distress. Nontoxic in appearance, well-developed, well-nourished,  Obese, pleasant caucasian male.  HENT: AT. [place]. Bilateral TM visualized and normal in appearance, normal external auditory canal. MMM, no oral lesions, adequate dentition. Bilateral nares within normal limits. Throat without erythema, ulcerations or  exudates. nno Cough on exam, no hoarseness on exam. Eyes:Pupils Equal Round Reactive to light, Extraocular movements intact,  Conjunctiva without redness, discharge or icterus. Neck/lymp/endocrine: Supple,nno lymphadenopathy, no thyromegaly CV: RRR no murmur, no edema, +2/4 P posterior tibialis pulses. no carotid bruits. No JVD. Chest: CTAB, no wheeze, rhonchi or crackles. normal Respiratory effort. good Air movement. Abd: Soft. obese. NTND. BS present. no Masses palpated. No hepatosplenomegaly. No rebound tenderness or guarding. Skin: no rashes, purpura or petechiae. Warm and well-perfused. Skin intact. Neuro/Msk:  Normal gait. PERLA. EOMi. Alert. Oriented x3.  Cranial nerves II through XII intact. Muscle strength 5/5 upper/lower extremity. DTRs equal bilaterally. Psych: Normal affect, dress and demeanor. Normal speech. Normal thought content and judgment.  No exam data present  Assessment/plan: Javier Casey is a 61 y.o. male present for CPE BMI 37.0-37.9, adult - HgB A1c - diet and exercise modifications encouraged HYPERTENSION, BENIGN ESSENTIAL/CKD/HLD - Stable today. Refills on medications provided.  - encourage baby ASA if able to tolerate.  - intolerant to statin use.  - continue fish oil, will adjust dose after he reports the dose he is taking.  - CBC w/Diff - Comp Met (CMET) - HgB A1c - lipid panel - losartan-hydrochlorothiazide (HYZAAR) 50-12.5 MG tablet; Take 1 tablet by mouth daily.  Dispense: 90 tablet; Refill: 1 - hydrochlorothiazide (HYDRODIURIL) 25 MG tablet; Take 1 tablet (25 mg total) by mouth daily.  Dispense: 90 tablet; Refill: 1 - F/U 6 months.   Encounter for preventive health examination Patient was encouraged to exercise greater than 150 minutes a week. Patient was encouraged to choose a diet filled with fresh fruits and vegetables, and lean meats. AVS provided to patient today for education/recommendation on gender specific health and safety maintenance. UTD  with all desired screenings and immunizations.  Flu vac  encouraged yearly.  Colonoscopy due 2019 for 3 year recall.  PSA declined.   Return in about 1 year (around 02/23/2018) for CPE.  Note is dictated utilizing voice recognition software. Although note has been proof read prior to signing, occasional typographical errors still can be missed. If any questions arise, please do not hesitate to call for verification.  Electronically signed by: Howard Pouch, DO [place]

## 2017-02-26 ENCOUNTER — Telehealth: Payer: Self-pay | Admitting: Family Medicine

## 2017-02-26 DIAGNOSIS — R7303 Prediabetes: Secondary | ICD-10-CM

## 2017-02-26 NOTE — Telephone Encounter (Signed)
Spoke with patient reviewed results and instructions . Patient verbalized understanding. 

## 2017-02-26 NOTE — Telephone Encounter (Signed)
Please call pt: - his labs are stable with the exception of his cholesterol and a1c.  - a1c (DM screen) went from 5.8 - 6.0. Diet lower in sugar and carbs (low potato, pasta, rice, white bread) recommended. We will repeat at next appt in 6  Months at his HTN follow up.  - His cholesterol also increased by a small amount. He was intolerant to the use of statin. I would suggest 2000-3000 mg of fish oil supplement (he was asked to check his dose at home and it ready for you). He can also try the krill oil or red yeast rice we discussed.  - Will repeat in 6 months as well.

## 2017-02-28 ENCOUNTER — Encounter: Payer: Self-pay | Admitting: Family Medicine

## 2017-03-26 ENCOUNTER — Encounter: Payer: Self-pay | Admitting: Family Medicine

## 2017-03-26 ENCOUNTER — Ambulatory Visit (INDEPENDENT_AMBULATORY_CARE_PROVIDER_SITE_OTHER): Payer: BLUE CROSS/BLUE SHIELD | Admitting: Family Medicine

## 2017-03-26 VITALS — BP 136/85 | HR 60 | Temp 98.1°F | Resp 20 | Wt 290.5 lb

## 2017-03-26 DIAGNOSIS — W57XXXA Bitten or stung by nonvenomous insect and other nonvenomous arthropods, initial encounter: Secondary | ICD-10-CM

## 2017-03-26 DIAGNOSIS — S70362A Insect bite (nonvenomous), left thigh, initial encounter: Secondary | ICD-10-CM | POA: Diagnosis not present

## 2017-03-26 DIAGNOSIS — L089 Local infection of the skin and subcutaneous tissue, unspecified: Secondary | ICD-10-CM

## 2017-03-26 MED ORDER — DOXYCYCLINE HYCLATE 100 MG PO TABS: 100.0000 mg | ORAL_TABLET | Freq: Two times a day (BID) | ORAL | 0 refills | Status: DC

## 2017-03-26 NOTE — Patient Instructions (Signed)
Treat with doxycyline every 12 hours for 10 days.   This should cover nay skin infection and potential lyme exposure.     Tick Bite Information Introduction Ticks are insects that attach themselves to the skin. There are many types of ticks. Common types include wood ticks and deer ticks. Sometimes, ticks carry diseases that can make a person very ill. The most common places for ticks to attach themselves are the scalp, neck, armpits, waist, and groin. HOW CAN YOU PREVENT TICK BITES? Take these steps to help prevent tick bites when you are outdoors:  Wear long sleeves and long pants.  Wear white clothes so you can see ticks more easily.  Tuck your pant legs into your socks.  If walking on a trail, stay in the middle of the trail to avoid brushing against bushes.  Avoid walking through areas with long grass.  Put bug spray on all skin that is showing and along boot tops, pant legs, and sleeve cuffs.  Check clothes, hair, and skin often and before going inside.  Brush off any ticks that are not attached.  Take a shower or bath as soon as possible after being outdoors.  HOW SHOULD YOU REMOVE A TICK? Ticks should be removed as soon as possible to help prevent diseases. 1. If latex gloves are available, put them on before trying to remove a tick. 2. Use tweezers to grasp the tick as close to the skin as possible. You may also use curved forceps or a tick removal tool. Grasp the tick as close to its head as possible. Avoid grasping the tick on its body. 3. Pull gently upward until the tick lets go. Do not twist the tick or jerk it suddenly. This may break off the tick's head or mouth parts. 4. Do not squeeze or crush the tick's body. This could force disease-carrying fluids from the tick into your body. 5. After the tick is removed, wash the bite area and your hands with soap and water or alcohol. 6. Apply a small amount of antiseptic cream or ointment to the bite site. 7. Wash any  tools that were used.  Do not try to remove a tick by applying a hot match, petroleum jelly, or fingernail polish to the tick. These methods do not work. They may also increase the chances of disease being spread from the tick bite. WHEN SHOULD YOU SEEK HELP? Contact your health care provider if you are unable to remove a tick or if a part of the tick breaks off in the skin. After a tick bite, you need to watch for signs and symptoms of diseases that can be spread by ticks. Contact your health care provider if you develop any of the following:  Fever.  Rash.  Redness and puffiness (swelling) in the area of the tick bite.  Tender, puffy lymph glands.  Watery poop (diarrhea).  Weight loss.  Cough.  Feeling more tired than normal (fatigue).  Muscle, joint, or bone pain.  Belly (abdominal) pain.  Headache.  Change in your level of consciousness.  Trouble walking or moving your legs.  Loss of feeling (numbness) in the legs.  Loss of movement (paralysis).  Shortness of breath.  Confusion.  Throwing up (vomiting) many times.  This information is not intended to replace advice given to you by your health care provider. Make sure you discuss any questions you have with your health care provider. Document Released: 09/27/2009 Document Revised: 12/09/2015 Document Reviewed: 12/11/2012 Elsevier Interactive Patient Education  2018 Elsevier Inc.  

## 2017-03-26 NOTE — Progress Notes (Signed)
Javier Casey , [birth year], 61 y.o., male MRN: [id number] Patient Care Team    Relationship Specialty Notifications Start End  Ma Hillock, DO PCP - General Family Medicine  08/13/15   Pyrtle, Lajuan Lines, MD Consulting Physician Gastroenterology  02/18/16     Chief Complaint  Patient presents with  . Insect Bite    tick/left thigh red swollen      Subjective: Pt presents for an OV with complaints of tick bite of 2 days duration.  Associated symptoms include red swollen area on his left thigh. He is worried about lyme exposure because his friend has lyme disease and was exposed from ticks in the same area he was exposed. He removed the tick easily and it was not engorged. He denies fever, chills, headache or rash in other locations. He noticed it only because of the large red area on his thigh surrounding the bite.  Depression screen [facility] 2/9 02/23/2017 02/18/2016  Decreased Interest 0 0  Down, Depressed, Hopeless 0 0  PHQ - 2 Score 0 0    Allergies  Allergen Reactions  . Ciprofloxacin     Severe swelling and rash  . Levofloxacin     Diffuse swelling with angioedema  . Lipitor [Atorvastatin] Other (See Comments)    Myalgia    Social History  Substance Use Topics  . Smoking status: Never Smoker  . Smokeless tobacco: Never Used  . Alcohol use 1.8 oz/week    3 Cans of beer per week     Comment: < 8 glasses / week of wine   Past Medical History:  Diagnosis Date  . Allergy    swelling lips ,nose, hands and feet, tongue, but unknown reason  . Asthma   . Colon polyps   . Diverticulosis   . Epididymal cyst 02/2016   Multiple on scrotal u/s  . GERD (gastroesophageal reflux disease)   . Hyperlipidemia   . Hypertension   . Hyperuricemia   . Tubular adenoma of colon 2009   Past Surgical History:  Procedure Laterality Date  . APPENDECTOMY    . colonoscopy with polypectomy  2009   [facility]  . WISDOM TOOTH EXTRACTION     Family History  Problem Relation Age of Onset  .  Coronary artery disease Mother 76       died post CBAG  . Allergic Disorder Daughter   . Allergic Disorder Son   . Cancer Neg Hx   . Stroke Neg Hx   . Diabetes Neg Hx   . Colon cancer Neg Hx    Allergies as of 03/26/2017      Reactions   Ciprofloxacin    Severe swelling and rash   Levofloxacin    Diffuse swelling with angioedema   Lipitor [atorvastatin] Other (See Comments)   Myalgia       Medication List       Accurate as of 03/26/17  1:03 PM. Always use your most recent med list.          B-12 2500 MCG Tabs Take by mouth daily.   hydrochlorothiazide 25 MG tablet Commonly known as:  HYDRODIURIL Take 1 tablet (25 mg total) by mouth daily.   losartan-hydrochlorothiazide 50-12.5 MG tablet Commonly known as:  HYZAAR Take 1 tablet by mouth daily.   predniSONE 10 MG tablet Commonly known as:  DELTASONE Take 2T PO once, then repeat in 12 h if swelling persists   ranitidine 150 MG tablet Commonly known as:  ZANTAC Take  150 mg by mouth as needed.   Vitamin D3 10000 units capsule Take 10,000 Units by mouth daily.       All past medical history, surgical history, allergies, family history, immunizations andmedications were updated in the EMR today and reviewed under the history and medication portions of their EMR.     ROS: Negative, with the exception of above mentioned in HPI   Objective:  BP 136/85 (BP Location: Left Arm, Patient Position: Sitting, Cuff Size: Large)   Pulse 60   Temp 98.1 F (36.7 C)   Resp 20   Wt 290 lb 8 oz (131.8 kg)   SpO2 99%   BMI 37.30 kg/m  Body mass index is 37.3 kg/m. Gen: Afebrile. No acute distress. Nontoxic in appearance, well developed, well nourished.  HENT: AT. [place]. MMM, no oral lesions.  Eyes:Pupils Equal Round Reactive to light, Extraocular movements intact,  Conjunctiva without redness, discharge or icterus. Neck/lymp/endocrine: Supple,no lymphadenopathy Skin: red swollen ~ 5 cm left inner thigh, no other  rashes,  purpura or petechiae. No insect remnant remains.   Neuro: Normal gait. PERLA. EOMi. Alert. Oriented x3   No exam data present No results found. No results found for this or any previous visit (from the past 24 hour(s)).  Assessment/Plan: Javier Casey is a 61 y.o. male present for OV for  Tick bite, initial encounter Skin infection - discussed preventive measures to tick exposure.  - given area of erythema, opted to treat with doxy BID x 10 d.  - F/U in 2 weeks if not improved, sooner if worsening.    Reviewed expectations re: course of current medical issues.  Discussed self-management of symptoms.  Outlined signs and symptoms indicating need for more acute intervention.  Patient verbalized understanding and all questions were answered.  Patient received an After-Visit Summary.    No orders of the defined types were placed in this encounter.    Note is dictated utilizing voice recognition software. Although note has been proof read prior to signing, occasional typographical errors still can be missed. If any questions arise, please do not hesitate to call for verification.   electronically signed by:  Howard Pouch, DO  [place]

## 2017-08-31 ENCOUNTER — Encounter: Payer: Self-pay | Admitting: Family Medicine

## 2017-08-31 ENCOUNTER — Ambulatory Visit: Payer: BLUE CROSS/BLUE SHIELD | Admitting: Family Medicine

## 2017-08-31 VITALS — BP 126/70 | HR 78 | Temp 98.3°F | Ht 74.0 in | Wt 284.8 lb

## 2017-08-31 DIAGNOSIS — N183 Chronic kidney disease, stage 3 unspecified: Secondary | ICD-10-CM

## 2017-08-31 DIAGNOSIS — E785 Hyperlipidemia, unspecified: Secondary | ICD-10-CM | POA: Diagnosis not present

## 2017-08-31 DIAGNOSIS — R7303 Prediabetes: Secondary | ICD-10-CM | POA: Diagnosis not present

## 2017-08-31 DIAGNOSIS — R6889 Other general symptoms and signs: Secondary | ICD-10-CM | POA: Diagnosis not present

## 2017-08-31 DIAGNOSIS — I10 Essential (primary) hypertension: Secondary | ICD-10-CM | POA: Diagnosis not present

## 2017-08-31 LAB — COMPREHENSIVE METABOLIC PANEL
ALT: 16 U/L (ref 0–53)
AST: 13 U/L (ref 0–37)
Albumin: 4 g/dL (ref 3.5–5.2)
Alkaline Phosphatase: 51 U/L (ref 39–117)
BILIRUBIN TOTAL: 0.6 mg/dL (ref 0.2–1.2)
BUN: 16 mg/dL (ref 6–23)
CALCIUM: 9.3 mg/dL (ref 8.4–10.5)
CHLORIDE: 102 meq/L (ref 96–112)
CO2: 29 meq/L (ref 19–32)
CREATININE: 1.37 mg/dL (ref 0.40–1.50)
GFR: 55.97 mL/min — ABNORMAL LOW (ref >60.00)
Glucose, Bld: 83 mg/dL (ref 70–99)
Potassium: 3.7 mEq/L (ref 3.5–5.1)
SODIUM: 140 meq/L (ref 135–145)
Total Protein: 6.6 g/dL (ref 6.0–8.3)

## 2017-08-31 LAB — LIPID PANEL
CHOLESTEROL: 217 mg/dL — AB (ref 0–200)
HDL: 31.2 mg/dL — AB (ref >39.00)
LDL Cholesterol: 151 mg/dL — ABNORMAL HIGH (ref 0–99)
NONHDL: 185.95
Total CHOL/HDL Ratio: 7
Triglycerides: 174 mg/dL — ABNORMAL HIGH (ref 0.0–149.0)
VLDL: 34.8 mg/dL (ref 0.0–40.0)

## 2017-08-31 LAB — POC INFLUENZA A&B (BINAX/QUICKVUE)
INFLUENZA A, POC: NEGATIVE
INFLUENZA B, POC: NEGATIVE

## 2017-08-31 LAB — TSH: TSH: 2.08 u[IU]/mL (ref 0.35–4.50)

## 2017-08-31 LAB — HEMOGLOBIN A1C: HEMOGLOBIN A1C: 6 % (ref 4.6–6.5)

## 2017-08-31 MED ORDER — AMOXICILLIN-POT CLAVULANATE 875-125 MG PO TABS: 1.0000 | ORAL_TABLET | Freq: Two times a day (BID) | ORAL | 0 refills | Status: DC

## 2017-08-31 MED ORDER — LOSARTAN POTASSIUM-HCTZ 50-12.5 MG PO TABS: 1.0000 | ORAL_TABLET | Freq: Every day | ORAL | 1 refills | Status: DC

## 2017-08-31 MED ORDER — HYDROCHLOROTHIAZIDE 25 MG PO TABS: 25.0000 mg | ORAL_TABLET | Freq: Every day | ORAL | 1 refills | Status: DC

## 2017-08-31 NOTE — Patient Instructions (Signed)
Rest, hydrate.  mucinex (DM if cough), nettie pot or nasal saline.  Start Augmentin Sunday if not improving.   If cough present it can last up to 6-8 weeks.  F/U 2 weeks of not improved.   Please help [place] help you:  We are honored you have chosen [facility] for your [facility] home. Below you will find basic instructions that you may need to access in the future. Please help [place] help you by reading the instructions, which cover many of the frequent questions we experience.   Prescription refills and request:  -In order to allow more efficient response time, please call your pharmacy for all refills. They will forward the request electronically to [place]. This allows for the quickest possible response. Request left on a nurse line can take longer to refill, since these are checked as time allows between office patients and other phone calls.  - refill request can take up to 3-5 working days to complete.  - If request is sent electronically and request is appropiate, it is usually completed in 1-2 business days.  - all patients will need to be seen routinely for all chronic medical conditions requiring prescription medications (see follow-up below). If you are overdue for follow up on your condition, you will be asked to make an appointment and we will call in enough medication to cover you until your appointment (up to 30 days).  - all controlled substances will require a face to face visit to request/refill.  - if you desire your prescriptions to go through a new pharmacy, and have an active script at original pharmacy, you will need to call your pharmacy and have scripts transferred to new pharmacy. This is completed between the pharmacy locations and not by your provider.    Results: If any images or labs were ordered, it can take up to 1 week to get results depending on the test ordered and the lab/facility running and resulting the test. - Normal or stable results, which do not need further  discussion, may be released to your mychart immediately with attached note to you. A call may not be generated for normal results. Please make certain to sign up for mychart. If you have questions on how to activate your mychart you can call the front office.  - If your results need further discussion, our office will attempt to contact you via phone, and if unable to reach you after 2 attempts, we will release your abnormal result to your mychart with instructions.  - All results will be automatically released in mychart after 1 week.  - Your provider will provide you with explanation and instruction on all relevant material in your results. Please keep in mind, results and labs may appear confusing or abnormal to the untrained eye, but it does not mean they are actually abnormal for you personally. If you have any questions about your results that are not covered, or you desire more detailed explanation than what was provided, you should make an appointment with your provider to do so.   Our office handles many outgoing and incoming calls daily. If we have not contacted you within 1 week about your results, please check your mychart to see if there is a message first and if not, then contact our office.  In helping with this matter, you help decrease call volume, and therefore allow [place] to be able to respond to patients needs more efficiently.   Acute office visits (sick visit):  An  acute visit is intended for a new problem and are scheduled in shorter time slots to allow schedule openings for patients with new problems. This is the appropriate visit to discuss a new problem. In order to provide you with excellent quality medical care with proper time for you to explain your problem, have an exam and receive treatment with instructions, these appointments should be limited to one new problem per visit. If you experience a new problem, in which you desire to be addressed, please make an acute office visit,  we save openings on the schedule to accommodate you. Please do not save your new problem for any other type of visit, let us take care of it properly and quickly for you.   Follow up visits:  Depending on your condition(s) your provider will need to see you routinely in order to provide you with quality care and prescribe medication(s). Most chronic conditions (Example: hypertension, Diabetes, depression/anxiety... etc), require visits a couple times a year. Your provider will instruct you on proper follow up for your personal medical conditions and history. Please make certain to make follow up appointments for your condition as instructed. Failing to do so could result in lapse in your medication treatment/refills. If you request a refill, and are overdue to be seen on a condition, we will always provide you with a 30 day script (once) to allow you time to schedule.    Medicare wellness (well visit): - we have a wonderful Nurse Maudie Mercury), that will meet with you and provide you will yearly medicare wellness visits. These visits should occur yearly (can not be scheduled less than 1 calendar year apart) and cover preventive health, immunizations, advance directives and screenings you are entitled to yearly through your medicare benefits. Do not miss out on your entitled benefits, this is when medicare will pay for these benefits to be ordered for you.  These are strongly encouraged by your provider and is the appropriate type of visit to make certain you are up to date with all preventive health benefits. If you have not had your medicare wellness exam in the last 12 months, please make certain to schedule one by calling the office and schedule your medicare wellness with Maudie Mercury as soon as possible.   Yearly physical (well visit):  - Adults are recommended to be seen yearly for physicals. Check with your insurance and date of your last physical, most insurances require one calendar year between physicals.  Physicals include all preventive health topics, screenings, medical exam and labs that are appropriate for gender/age and history. You may have fasting labs needed at this visit. This is a well visit (not a sick visit), new problems should not be covered during this visit (see acute visit).  - Pediatric patients are seen more frequently when they are younger. Your provider will advise you on well child visit timing that is appropriate for your their age. - This is not a medicare wellness visit. Medicare wellness exams do not have an exam portion to the visit. Some medicare companies allow for a physical, some do not allow a yearly physical. If your medicare allows a yearly physical you can schedule the medicare wellness with our nurse Maudie Mercury and have your physical with your provider after, on the same day. Please check with insurance for your full benefits.   Late Policy/No Shows:  - all new patients should arrive 15-30 minutes earlier than appointment to allow [place] time  to  obtain all personal demographics,  insurance  information and for you to complete office paperwork. - All established patients should arrive 10-15 minutes earlier than appointment time to update all information and be checked in .  - In our best efforts to run on time, if you are late for your appointment you will be asked to either reschedule or if able, we will work you back into the schedule. There will be a wait time to work you back in the schedule,  depending on availability.  - If you are unable to make it to your appointment as scheduled, please call 24 hours ahead of time to allow [place] to fill the time slot with someone else who needs to be seen. If you do not cancel your appointment ahead of time, you may be charged a no show fee.

## 2017-08-31 NOTE — Progress Notes (Signed)
Patient ID: Javier Casey, male  DOB: 08-05-1955, 62 y.o.   MRN: [id number] Patient Care Team    Relationship Specialty Notifications Start End  Ma Hillock, DO PCP - General Family Medicine  08/13/15   Pyrtle, Lajuan Lines, MD Consulting Physician Gastroenterology  02/18/16     Subjective:  Javier Casey is a 62 y.o. male present for HTN followup  HTN/HLD: Pt reports compliance with hyzaar 50-12.5 QD, HCTZ 25 mg, and Lipitor 10 QD. Blood pressures ranges at home 130-140/80s. Patient denies chest pain, shortness of breath. He has lower extremity edema.  BMP: 02/18/2016 with Cr 1.39, GFR 55 CBC: 02/18/2016 WNL Diet: He watches the salt content Exercise: he tries to walk, but has not been so go at it since its been winter.  RF: HLD  (collected 02/2016). Obesity. FHX- CAD/CABG  Prediabetes: last a1c 02/23/2017 --> 6.0.   Flu- like symptoms: pt reports 3 days sore throat, cough, body aches, weakness, sinus drainage. He has not had his flu shot this year.  Immunization History  Administered Date(s) Administered  . Influenza,inj,Quad PF,6+ Mos 04/30/2013, 04/13/2014  . Pneumococcal Conjugate-13 04/13/2014  . Td 02/14/2006  . Tdap 01/31/2016     Past Medical History:  Diagnosis Date  . Allergy    swelling lips ,nose, hands and feet, tongue, but unknown reason  . Asthma   . Colon polyps   . Diverticulosis   . Epididymal cyst 02/2016   Multiple on scrotal u/s  . GERD (gastroesophageal reflux disease)   . Hyperlipidemia   . Hypertension   . Hyperuricemia   . Tubular adenoma of colon 2009   Allergies  Allergen Reactions  . Ciprofloxacin     Severe swelling and rash  . Levofloxacin     Diffuse swelling with angioedema  . Lipitor [Atorvastatin] Other (See Comments)    Myalgia    Past Surgical History:  Procedure Laterality Date  . APPENDECTOMY    . colonoscopy with polypectomy  2009   [facility]  . WISDOM TOOTH EXTRACTION     Family History  Problem Relation Age of Onset   . Coronary artery disease Mother 23       died post CBAG  . Allergic Disorder Daughter   . Allergic Disorder Son   . Cancer Neg Hx   . Stroke Neg Hx   . Diabetes Neg Hx   . Colon cancer Neg Hx    Social History   Socioeconomic History  . Marital status: Married    Spouse name: Not on file  . Number of children: Not on file  . Years of education: Not on file  . Highest education level: Not on file  Social Needs  . Financial resource strain: Not on file  . Food insecurity - worry: Not on file  . Food insecurity - inability: Not on file  . Transportation needs - medical: Not on file  . Transportation needs - non-medical: Not on file  Occupational History  . Not on file  Tobacco Use  . Smoking status: Never Smoker  . Smokeless tobacco: Never Used  Substance and Sexual Activity  . Alcohol use: Yes    Alcohol/week: 1.8 oz    Types: 3 Cans of beer per week    Comment: < 8 glasses / week of wine  . Drug use: No  . Sexual activity: Yes  Other Topics Concern  . Not on file  Social History Narrative  . Not on file  Allergies as of 08/31/2017      Reactions   Ciprofloxacin    Severe swelling and rash   Levofloxacin    Diffuse swelling with angioedema   Lipitor [atorvastatin] Other (See Comments)   Myalgia       Medication List        Accurate as of 08/31/17  2:23 PM. Always use your most recent med list.          B-12 2500 MCG Tabs Take by mouth daily.   hydrochlorothiazide 25 MG tablet Commonly known as:  HYDRODIURIL Take 1 tablet (25 mg total) by mouth daily.   losartan-hydrochlorothiazide 50-12.5 MG tablet Commonly known as:  HYZAAR Take 1 tablet by mouth daily.   predniSONE 10 MG tablet Commonly known as:  DELTASONE Take 2T PO once, then repeat in 12 h if swelling persists   ranitidine 150 MG tablet Commonly known as:  ZANTAC Take 150 mg by mouth as needed.   Vitamin D3 10000 units capsule Take 10,000 Units by mouth daily.        No results  found for this or any previous visit (from the past 2160 hour(s)).  No results found.   ROS: 14 pt review of systems performed and negative (unless mentioned in an HPI)  Objective: BP 126/70 (BP Location: Right Arm, Patient Position: Sitting, Cuff Size: Large)   Pulse 78   Temp 98.3 F (36.8 C) (Oral)   Ht 6\' 2"  (1.88 m)   Wt 284 lb 12.8 oz (129.2 kg)   SpO2 97%   BMI 36.57 kg/m   Gen: Afebrile. No acute distress. Nontoxic. Obese, pleasant, caucasian male.  HENT: AT. [place]. Bilateral TM visualized and normal in appearance. MMM. Bilateral nares without erythema, mild drainage present, dried blood left. Throat without erythema or exudates. No cough. No hoarseness.  Eyes:Pupils Equal Round Reactive to light, Extraocular movements intact,  Conjunctiva without redness, discharge or icterus. Neck/lymp/endocrine: Supple,bilateral tender ant cervical  lymphadenopathy CV: RRR no murmur, trace edema, +2/4 P posterior tibialis pulses Chest: CTAB, no wheeze or crackles Abd: Soft. NTND. BS present Neuro: Normal gait. PERLA. EOMi. Alert. Oriented x3   Assessment/plan: Javier Casey is a 62 y.o. male present for HTN follow up. Hyperlipidemia/hypertension/CKD3 - continue current regimen, stable. Refills provided today. - HCTZ 25 mg QD, Hyzaar 50-12.5 - Low salt diet. Increase exercise to > 150 minutes a week.  - BMP today, check every 6 months. Collected today - lipids collected today--> pt agrees to try pravastatin if elevated, could not tolerate Lipitor.  - F/U 6 months if BP controlled on current dose.   Prediabetes:  - a1c collected today  Upper respiratory infection:  Rest, hydrate.  - influenza negative today +flonase, mucinex (DM if cough), nettie pot or nasal saline.  Augmentin prescribed, take until completed IF started. Printed, start on Sunday if not improving. If cough present it can last up to 6-8 weeks.  F/U 2 weeks of not improved.    Electronically signed by: Howard Pouch, DO [facility]

## 2017-09-03 ENCOUNTER — Telehealth: Payer: Self-pay | Admitting: Family Medicine

## 2017-09-03 MED ORDER — PRAVASTATIN SODIUM 20 MG PO TABS: 20.0000 mg | ORAL_TABLET | Freq: Every day | ORAL | 3 refills | Status: DC

## 2017-09-03 NOTE — Telephone Encounter (Signed)
Spoke with patient reviewed lab results and instructions. Patient verbalized understanding. Patient will call to schedule abn appt after he starts medication,

## 2017-09-03 NOTE — Telephone Encounter (Addendum)
Please inform pt: - his cholesterol is improved from prior, but still higher than desired. I have called in the pravastatin at low - myalgias are less likely with this particular cholesterol med.  --> F/U 3-4 months with provider appt (please be fasting-will rpt lipid/lft) - A1c is still "prediabetic range" at 6.0 same as last time.  - increase exercise > 150 min a week, higher fiber diet and lower saturated fats/sugar will help prevent progression to diabetes.   All other labs stable.

## 2017-10-29 ENCOUNTER — Encounter: Payer: Self-pay | Admitting: Internal Medicine

## 2018-02-07 ENCOUNTER — Ambulatory Visit: Payer: Self-pay

## 2018-02-07 NOTE — Telephone Encounter (Signed)
Attempted to reach patient to discuss symptoms, no VM available. Pt will need to be seen at [facility] or appt with PCP in am.

## 2018-02-07 NOTE — Telephone Encounter (Signed)
Incoming call from patient with complaint of swollen upper lip that started swelling last night.  Patient states he has a history of double pneumonia in 2005.  Was placed on Levaquin.  Developed an allergic reaction.  Every now and then he has a flare up.  States his "lip is about the size of his thumb".  Denies swelling on bottom lip. Denies difficulty breathing.  Eating and drinking fine.  States he usually takes prednisone with a flare up.  Took 2 5mg   Last night.  Lip continues to be swollen  Does not have any more. Provided care advice. States Dr.Kuneff usually prescribes the prednisone.  Would like for Dr. Raoul Pitch to call in prescription for prednisone, place call to patient at, [phone]    Reason for Disposition . Caller requesting a refill, no triage required, and triager able to refill per unit policy    Will route encounter to office  Answer Assessment - Initial Assessment Questions 1. SYMPTOMS: "Do you have any symptoms?"     Denies fever, denies difficulty breathing, denies  2. SEVERITY: If symptoms are present, ask "Are they mild, moderate or severe?"     States he is exhausted.  Swelling started last night. Moderate.  Protocols used: MEDICATION QUESTION CALL-A-AH

## 2018-02-08 NOTE — Telephone Encounter (Signed)
Spoke with patient, he stated that swelling is gone and that he is doing fine. Declined appointment.

## 2018-03-01 ENCOUNTER — Encounter: Payer: Self-pay | Admitting: Family Medicine

## 2018-03-01 ENCOUNTER — Ambulatory Visit: Payer: BLUE CROSS/BLUE SHIELD | Admitting: Family Medicine

## 2018-03-01 VITALS — BP 145/78 | HR 80 | Temp 98.5°F | Resp 20 | Ht 74.0 in | Wt 296.0 lb

## 2018-03-01 DIAGNOSIS — R609 Edema, unspecified: Secondary | ICD-10-CM

## 2018-03-01 DIAGNOSIS — R21 Rash and other nonspecific skin eruption: Secondary | ICD-10-CM | POA: Diagnosis not present

## 2018-03-01 DIAGNOSIS — R5383 Other fatigue: Secondary | ICD-10-CM | POA: Diagnosis not present

## 2018-03-01 DIAGNOSIS — R7303 Prediabetes: Secondary | ICD-10-CM

## 2018-03-01 DIAGNOSIS — N183 Chronic kidney disease, stage 3 unspecified: Secondary | ICD-10-CM

## 2018-03-01 DIAGNOSIS — E785 Hyperlipidemia, unspecified: Secondary | ICD-10-CM | POA: Diagnosis not present

## 2018-03-01 DIAGNOSIS — I10 Essential (primary) hypertension: Secondary | ICD-10-CM

## 2018-03-01 DIAGNOSIS — E559 Vitamin D deficiency, unspecified: Secondary | ICD-10-CM

## 2018-03-01 MED ORDER — TRIAMCINOLONE ACETONIDE 0.1 % EX CREA: 1.0000 "application " | TOPICAL_CREAM | Freq: Two times a day (BID) | CUTANEOUS | 2 refills | Status: DC

## 2018-03-01 MED ORDER — LOSARTAN POTASSIUM-HCTZ 50-12.5 MG PO TABS: 1.0000 | ORAL_TABLET | Freq: Every day | ORAL | 1 refills | Status: DC

## 2018-03-01 MED ORDER — HYDROCHLOROTHIAZIDE 25 MG PO TABS: 25.0000 mg | ORAL_TABLET | Freq: Every day | ORAL | 1 refills | Status: DC

## 2018-03-01 MED ORDER — PREDNISONE 20 MG PO TABS
ORAL_TABLET | ORAL | 1 refills | Status: AC
Start: 2018-03-01 — End: 2018-05-31

## 2018-03-01 MED ORDER — EZETIMIBE 10 MG PO TABS
10.0000 mg | ORAL_TABLET | Freq: Every day | ORAL | 3 refills | Status: DC
Start: 2018-03-01 — End: 2018-10-03

## 2018-03-01 NOTE — Patient Instructions (Signed)
Called in [facility] start for cholesterol. This is not a statin.  I called prednisone at 20 mg a tab.  I called in BP med refills.  I called in steroid cream to put over rash  Pick up Cetaphil or cerave cream.   Follow up in 4 weeks on rash if not resolved and labs today are normal.  I look into the Flouroquinolone/pain etc.

## 2018-03-01 NOTE — Progress Notes (Signed)
Patient ID: Javier Casey, male  DOB: Nov 24, 1955, 62 y.o.   MRN: [id number] Patient Care Team    Relationship Specialty Notifications Start End  Ma Hillock, DO PCP - General Family Medicine  08/13/15   Pyrtle, Lajuan Lines, MD Consulting Physician Gastroenterology  02/18/16    Chief Complaint  Patient presents with  . Hypertension  . Hyperlipidemia  . prediabetes    Subjective:  Javier Casey is a 62 y.o. male present for HTN followup  HTN/HLD/morbid obesity/CKD3: Pt reports usually compliant with  hyzaar 50-12.5 QD, HCTZ 25 mg but he did not take med today. Blood pressures ranges at home in normal ranges. Patient denies chest pain, shortness of breath, dizziness or lower extremity edema. He stopped the pravastatin because he experienced muscle cramps again. Intolerant to statin group.  BMP: 08/31/2017 GFR 55 CBC: 02/23/2017 WNL TSH:08/31/2017 WNL Lipids: 08/31/2017 total217, H31, L151, tg 174 Diet: He watches the salt content Exercise: he tries to walk RF: HLD  (collected 02/2016). Obesity. FHX- CAD/CABG  Prediabetes: last a1c 02/23/2017 --> 6.0--> 5.6 today.    Angioedema:  Last episode July 24th. Swelling of upper lip. Typically takes 20 mg of prednisone and if needed repeats in 12 hours, symptoms resolve. Occurs a few times a month at its worse and sometimes not for 3 months. No cause has ever been found. He has been to an allergist back in 2005 ish and reported having skin test for allergies that only showed dust mite allergy. At that time he was being seen for for allergy to FQ in which his tongue swelled. Since that time he reports muscle fatigue and random hand swelling, lip swelling.   Fatigue/rash/myalgia: Recently, over the last 3-4 months, he has noticed increased fatigue. Fatigue is throughout the day. Right knee pain locking. Ribs, back and shoulder intermittent pain. Pain along his right transverse arch of foot that is burning in nature intermittently.He has experienced a  new rash over the last few months on his bilateral calves and bilateral arms- symmetrical.  Pt has concerns surrounding FQ-associated syndrome since he had an allergic reaction in 2005 to levaquin. He has never had a tendon rupture. He has done a lot of reading and research on the subject and would like to see if this the cause of symptoms.   Immunization History  Administered Date(s) Administered  . Influenza,inj,Quad PF,6+ Mos 04/30/2013, 04/13/2014  . Pneumococcal Conjugate-13 04/13/2014  . Td 02/14/2006  . Tdap 01/31/2016     Past Medical History:  Diagnosis Date  . Allergy    swelling lips ,nose, hands and feet, tongue, but unknown reason  . Asthma   . Colon polyps   . Diverticulosis   . Epididymal cyst 02/2016   Multiple on scrotal u/s  . GERD (gastroesophageal reflux disease)   . Hyperlipidemia   . Hypertension   . Hyperuricemia   . Tubular adenoma of colon 2009   Allergies  Allergen Reactions  . Ciprofloxacin     Severe swelling and rash  . Levofloxacin     Diffuse swelling with angioedema  . Lipitor [Atorvastatin] Other (See Comments)    Myalgia    Past Surgical History:  Procedure Laterality Date  . APPENDECTOMY    . colonoscopy with polypectomy  2009   [facility]  . WISDOM TOOTH EXTRACTION     Family History  Problem Relation Age of Onset  . Coronary artery disease Mother 12       died post  CBAG  . Allergic Disorder Daughter   . Allergic Disorder Son   . Cancer Neg Hx   . Stroke Neg Hx   . Diabetes Neg Hx   . Colon cancer Neg Hx    Social History   Socioeconomic History  . Marital status: Married    Spouse name: Not on file  . Number of children: Not on file  . Years of education: Not on file  . Highest education level: Not on file  Occupational History  . Not on file  Social Needs  . Financial resource strain: Not on file  . Food insecurity:    Worry: Not on file    Inability: Not on file  . Transportation needs:    Medical: Not on  file    Non-medical: Not on file  Tobacco Use  . Smoking status: Never Smoker  . Smokeless tobacco: Never Used  Substance and Sexual Activity  . Alcohol use: Yes    Alcohol/week: 3.0 standard drinks    Types: 3 Cans of beer per week    Comment: < 8 glasses / week of wine  . Drug use: No  . Sexual activity: Yes  Lifestyle  . Physical activity:    Days per week: Not on file    Minutes per session: Not on file  . Stress: Not on file  Relationships  . Social connections:    Talks on phone: Not on file    Gets together: Not on file    Attends religious service: Not on file    Active member of club or organization: Not on file    Attends meetings of clubs or organizations: Not on file    Relationship status: Not on file  . Intimate partner violence:    Fear of current or ex partner: Not on file    Emotionally abused: Not on file    Physically abused: Not on file    Forced sexual activity: Not on file  Other Topics Concern  . Not on file  Social History Narrative  . Not on file   Allergies as of 03/01/2018      Reactions   Ciprofloxacin    Severe swelling and rash   Levofloxacin    Diffuse swelling with angioedema   Lipitor [atorvastatin] Other (See Comments)   Myalgia       Medication List        Accurate as of 03/01/18  3:48 PM. Always use your most recent med list.          B-12 2500 MCG Tabs Take by mouth daily.   hydrochlorothiazide 25 MG tablet Commonly known as:  HYDRODIURIL Take 1 tablet (25 mg total) by mouth daily.   losartan-hydrochlorothiazide 50-12.5 MG tablet Commonly known as:  HYZAAR Take 1 tablet by mouth daily.   pravastatin 20 MG tablet Commonly known as:  PRAVACHOL Take 1 tablet (20 mg total) by mouth daily.   ranitidine 150 MG tablet Commonly known as:  ZANTAC Take 150 mg by mouth as needed.   Vitamin D3 10000 units capsule Take 10,000 Units by mouth daily.        No results found for this or any previous visit (from the past  2160 hour(s)).  No results found.   ROS: 14 pt review of systems performed and negative (unless mentioned in an HPI)  Objective: BP (!) 145/78 (BP Location: Right Arm, Patient Position: Sitting, Cuff Size: Large)   Pulse 80   Temp 98.5 F (36.9 C)  Resp 20   Ht 6' 2" (1.88 m)   Wt 296 lb (134.3 kg)   SpO2 98%   BMI 38.00 kg/m   Gen: Afebrile. No acute distress. Nontoxic. Obese male. Appears fatigued HENT: AT. [place].  MMM.  Eyes:Pupils Equal Round Reactive to light, Extraocular movements intact,  Conjunctiva without redness, discharge or icterus. Neck/lymp/endocrine: Supple,no lymphadenopathy, no thyromegaly CV: RRR no murmur, no edema, +2/4 P posterior tibialis pulses Chest: CTAB, no wheeze or crackles Abd: Soft. obese. NTND. BS present.  Skin: patchy red scaly rash dorsal hand and forearm, small patch on biceps, large patch bilateral calves, No purpura or petechiae.  Neuro: Normal gait. PERLA. EOMi. Alert. Oriented x3 Psych: Normal affect, dress and demeanor. Normal speech. Normal thought content and judgment.   Assessment/plan: Javier Casey is a 62 y.o. male present for HTN follow up. Hyperlipidemia/hypertension/CKD2- has been stable. Did not take meds today. Not fasting either.  - could not tolerate pravastatin. Statin intolerant. Try zeita 10 mg qd. Lipids next visit - home BP normal- continue HCTZ 25 mg QD, Hyzaar 50-12.5 - Low salt diet. Increase exercise to > 150 minutes a week.  - F/U 3 months if BP controlled on current dose with repeat lipids, once stable can be seen q 6 months.  Prediabetes:  - a1c 6.0--> 5.6 today. Diet and exercise encouraged.   arthralgia/Myalgia/Fatigue/rash/angioedema: - angioedema/lip swelling occurs every few months and is chronic. Responds to prednisone short course. Refilled for him today to have on hand. - rash is new, in the setting of increase fatigue and myalgia/arthralgia is concerning. For potential infectious/tick bourne disease  or autoimmune disease/dermatomyositis.  - Rocky mtn spotted fvr abs pnl(IgG+IgM) - Sedimentation rate - B. burgdorfi antibodies - Vitamin D (25 hydroxy) - B12 - TSH was normal a few months ago.  - Consider further work up on inflammatory myopathies or referral to rheumatology. Symmetrical rash present.    - CK normal 2016 and 2015    - ESR normal 2016    - ANA negative 2016    - RF negative 2016 - kenalog cream prescribed for rash. OTC cetaphil.  - OTC pain relief for now. He does not desire narcotic. Need to figure out cause of pains in order to better treat. He is ok with OTC for now.  4 weeks f/u  Vitamin D deficiency - Vitamin D (25 hydroxy)  Greater than 40 minutes spent with patient, >50% of time spent face to face counseling and coordinating care.     Electronically signed by: Howard Pouch, DO [place]

## 2018-03-04 LAB — SEDIMENTATION RATE: SED RATE: 19 mm/h (ref 0–20)

## 2018-03-04 LAB — VITAMIN B12

## 2018-03-04 LAB — ROCKY MTN SPOTTED FVR ABS PNL(IGG+IGM)
RMSF IGG: NOT DETECTED
RMSF IGM: NOT DETECTED

## 2018-03-04 LAB — VITAMIN D 25 HYDROXY (VIT D DEFICIENCY, FRACTURES): VIT D 25 HYDROXY: 62 ng/mL (ref 30–100)

## 2018-03-04 LAB — B. BURGDORFI ANTIBODIES: B burgdorferi Ab IgG+IgM: <0.9 index

## 2018-03-05 ENCOUNTER — Telehealth: Payer: Self-pay | Admitting: Family Medicine

## 2018-03-05 ENCOUNTER — Encounter: Payer: Self-pay | Admitting: Family Medicine

## 2018-03-05 NOTE — Telephone Encounter (Signed)
Please inform patient the following information: His labs are negative for tic bourne cause of symptoms and rash.  Vit d and b12 are well supplemented.  I would like to collect additional lab work ruling out a disorder called dermatomyositis which can cause muscle fatigue and rash. I would also recommend we obtain a biopsy of his skin rash. We can do that here (30 min procedure appt- 1100 slot best- not on a Thursday) or we can refer him to dermatology.  As far as his concerns of prior use of  Flouroquinolone many years ago causing symptoms  --> its is possible. It is also possible it is related to the above condition mentioned.  Rheumatology would be the next step for evaluation. Can refer now or wait until labs and biopsy return if desiring work up to start here.

## 2018-03-05 NOTE — Telephone Encounter (Signed)
Phone call attempted, unable to leave message, voice mail not set up. Will leave message with [facility] nurse and will try to call back at later time.

## 2018-03-05 NOTE — Telephone Encounter (Addendum)
Pt returned call and message given to him as requested for lab results. Pt voiced understanding.  Pt would like to have the biopsy done in the office. Unable to schedule in the 11 am slot. So need to call and schedule appointment for biopsy.  He stated also that the cream that you prescribed for his rash has clear up the area on his right calf. He says that it is almost gone.  He will wait until after the labs and biopsy is done before seeing a dermatologist.  No result note found.

## 2018-03-06 LAB — POCT GLYCOSYLATED HEMOGLOBIN (HGB A1C): Hemoglobin A1C: 5.6 % (ref 4.0–5.6)

## 2018-03-06 NOTE — Telephone Encounter (Signed)
Appointment scheduled for biopsy 03/08/18 @ 11am.

## 2018-03-06 NOTE — Telephone Encounter (Signed)
Appointment scheduled 8.23.19 @ 11am.

## 2018-03-08 ENCOUNTER — Encounter: Payer: Self-pay | Admitting: Family Medicine

## 2018-03-08 ENCOUNTER — Ambulatory Visit: Payer: BLUE CROSS/BLUE SHIELD | Admitting: Family Medicine

## 2018-03-08 VITALS — BP 134/72 | HR 78 | Temp 98.1°F | Resp 16 | Ht 74.0 in | Wt 289.0 lb

## 2018-03-08 DIAGNOSIS — R21 Rash and other nonspecific skin eruption: Secondary | ICD-10-CM | POA: Diagnosis not present

## 2018-03-08 DIAGNOSIS — R5383 Other fatigue: Secondary | ICD-10-CM

## 2018-03-08 DIAGNOSIS — L308 Other specified dermatitis: Secondary | ICD-10-CM | POA: Diagnosis not present

## 2018-03-08 DIAGNOSIS — M791 Myalgia, unspecified site: Secondary | ICD-10-CM

## 2018-03-08 LAB — CBC WITH DIFFERENTIAL/PLATELET
BASOS PCT: 1.4 % (ref 0.0–3.0)
Basophils Absolute: 0.1 10*3/uL (ref 0.0–0.1)
EOS ABS: 0.2 10*3/uL (ref 0.0–0.7)
Eosinophils Relative: 3.3 % (ref 0.0–5.0)
HEMATOCRIT: 45.3 % (ref 39.0–52.0)
Hemoglobin: 15.4 g/dL (ref 13.0–17.0)
LYMPHS PCT: 18.3 % (ref 12.0–46.0)
Lymphs Abs: 1.3 10*3/uL (ref 0.7–4.0)
MCHC: 33.9 g/dL (ref 30.0–36.0)
MCV: 86.5 fl (ref 78.0–100.0)
MONOS PCT: 10.9 % (ref 3.0–12.0)
Monocytes Absolute: 0.8 10*3/uL (ref 0.1–1.0)
NEUTROS ABS: 4.6 10*3/uL (ref 1.4–7.7)
Neutrophils Relative %: 66.1 % (ref 43.0–77.0)
PLATELETS: 213 10*3/uL (ref 150.0–400.0)
RBC: 5.24 Mil/uL (ref 4.22–5.81)
RDW: 14.9 % (ref 11.5–15.5)
WBC: 7 10*3/uL (ref 4.0–10.5)

## 2018-03-08 LAB — C-REACTIVE PROTEIN: CRP: 1.1 mg/dL (ref 0.5–20.0)

## 2018-03-08 LAB — CK: Total CK: 82 U/L (ref 7–232)

## 2018-03-08 NOTE — Progress Notes (Signed)
Patient ID: Javier Casey, male  DOB: 09-28-1955, 62 y.o.   MRN: [id number] Patient Care Team    Relationship Specialty Notifications Start End  Ma Hillock, DO PCP - General Family Medicine  08/13/15   Pyrtle, Lajuan Lines, MD Consulting Physician Gastroenterology  02/18/16    Chief Complaint  Patient presents with  . Biopsy    rash    Subjective:  Javier Casey is a 62 y.o. male present for   Fatigue/rash/myalgia/polyarthralgia: Pt presents today for further evaluation on the symptoms/HPI provided last week at his [facility] appt. Pt has never experienced a rash like this in the past- symmetric presentation bilateral hands, lower and upper extremities. It is responding to Kenalog steroid cream --> right calf lesion remains, but improved.  B12, Vit D, sed rate, a1c, RMSF and Lyme titers were negative 03/04/2018.  Prior labs to date:  - Rocky mtn spotted fvr abs pnl(IgG+IgM)--> negative - Sedimentation rate--> wnl - B. burgdorfi antibodies--> negative - Vitamin D (25 hydroxy)--> WNL - B12--> WNL - TSH was normal a few months ago.  - CK normal 2016 and 2015 - ESR normal 2016 - ANA negative 2016 - RF negative 2016 Prior note:  Recently, over the last 3-4 months, he has noticed increased fatigue. Fatigue is throughout the day. Right knee pain locking. Ribs, back and shoulder intermittent pain. Pain along his right transverse arch of foot that is burning in nature intermittently.He has experienced a new rash over the last few months on his bilateral calves and bilateral arms- symmetrical.  Pt has concerns surrounding FQ-associated syndrome since he had an allergic reaction in 2005 to levaquin. He has never had a tendon rupture. He has done a lot of reading and research on the subject and would like to see if this the cause of symptoms.   Immunization History  Administered Date(s) Administered  . Influenza,inj,Quad PF,6+ Mos 04/30/2013, 04/13/2014  . Pneumococcal Conjugate-13 04/13/2014  .  Td 02/14/2006  . Tdap 01/31/2016     Past Medical History:  Diagnosis Date  . Allergy    swelling lips ,nose, hands and feet, tongue, but unknown reason  . Asthma   . Colon polyps   . Diverticulosis   . Epididymal cyst 02/2016   Multiple on scrotal u/s  . GERD (gastroesophageal reflux disease)   . Hyperlipidemia   . Hypertension   . Hyperuricemia   . Tubular adenoma of colon 2009   Allergies  Allergen Reactions  . Ciprofloxacin     Severe swelling and rash  . Levofloxacin     Diffuse swelling with angioedema  . Pravastatin Other (See Comments)    myalgia  . Lipitor [Atorvastatin] Other (See Comments)    Myalgia    Past Surgical History:  Procedure Laterality Date  . APPENDECTOMY    . colonoscopy with polypectomy  2009   [facility]  . WISDOM TOOTH EXTRACTION     Family History  Problem Relation Age of Onset  . Coronary artery disease Mother 19       died post CBAG  . Allergic Disorder Daughter   . Allergic Disorder Son   . Cancer Neg Hx   . Stroke Neg Hx   . Diabetes Neg Hx   . Colon cancer Neg Hx    Social History   Socioeconomic History  . Marital status: Married    Spouse name: Not on file  . Number of children: Not on file  . Years of education:  Not on file  . Highest education level: Not on file  Occupational History  . Not on file  Social Needs  . Financial resource strain: Not on file  . Food insecurity:    Worry: Not on file    Inability: Not on file  . Transportation needs:    Medical: Not on file    Non-medical: Not on file  Tobacco Use  . Smoking status: Never Smoker  . Smokeless tobacco: Never Used  Substance and Sexual Activity  . Alcohol use: Yes    Alcohol/week: 3.0 standard drinks    Types: 3 Cans of beer per week    Comment: < 8 glasses / week of wine  . Drug use: No  . Sexual activity: Yes  Lifestyle  . Physical activity:    Days per week: Not on file    Minutes per session: Not on file  . Stress: Not on file    Relationships  . Social connections:    Talks on phone: Not on file    Gets together: Not on file    Attends religious service: Not on file    Active member of club or organization: Not on file    Attends meetings of clubs or organizations: Not on file    Relationship status: Not on file  . Intimate partner violence:    Fear of current or ex partner: Not on file    Emotionally abused: Not on file    Physically abused: Not on file    Forced sexual activity: Not on file  Other Topics Concern  . Not on file  Social History Narrative  . Not on file   Allergies as of 03/08/2018      Reactions   Ciprofloxacin    Severe swelling and rash   Levofloxacin    Diffuse swelling with angioedema   Pravastatin Other (See Comments)   myalgia   Lipitor [atorvastatin] Other (See Comments)   Myalgia       Medication List        Accurate as of 03/08/18 11:10 AM. Always use your most recent med list.          B-12 2500 MCG Tabs Take by mouth daily.   ezetimibe 10 MG tablet Commonly known as:  ZETIA Take 1 tablet (10 mg total) by mouth daily.   hydrochlorothiazide 25 MG tablet Commonly known as:  HYDRODIURIL Take 1 tablet (25 mg total) by mouth daily.   losartan-hydrochlorothiazide 50-12.5 MG tablet Commonly known as:  HYZAAR Take 1 tablet by mouth daily.   predniSONE 20 MG tablet Commonly known as:  DELTASONE Take 1 T PO once PRN swelling, repeat one time in 12H if swelling persists.   ranitidine 150 MG tablet Commonly known as:  ZANTAC Take 150 mg by mouth as needed.   triamcinolone cream 0.1 % Commonly known as:  KENALOG Apply 1 application topically 2 (two) times daily.   Vitamin D3 10000 units capsule Take 10,000 Units by mouth daily.        Recent Results (from the past 2160 hour(s))  Rocky mtn spotted fvr abs pnl(IgG+IgM)     Status: None   Collection Time: 03/01/18  4:33 PM  Result Value Ref Range   RMSF IgG NOT DETECTED NOT DETECT   RMSF IgM NOT DETECTED  NOT DETECT  Sedimentation rate     Status: None   Collection Time: 03/01/18  4:33 PM  Result Value Ref Range   Sed Rate 19 0 -  20 mm/h  B. burgdorfi antibodies     Status: None   Collection Time: 03/01/18  4:33 PM  Result Value Ref Range   B burgdorferi Ab IgG+IgM <0.90 index    Comment:                    Index                Interpretation                    -----                --------------                    < 0.90               Negative                    0.90-1.09            Equivocal                    > 1.09               Positive . As recommended by the Food and Drug Administration  (FDA), all samples with positive or equivocal  results in a Borrelia burgdorferi antibody screen will be tested using a blot method. Positive or  equivocal screening test results should not be  interpreted as truly positive until verified as such  using a supplemental assay (e.g., B. burgdorferi blot). . The screening test and/or blot for B. burgdorferi  antibodies may be falsely negative in early stages of Lyme disease, including the period when erythema  migrans is apparent. .   Vitamin D (25 hydroxy)     Status: None   Collection Time: 03/01/18  4:33 PM  Result Value Ref Range   Vit D, 25-Hydroxy 62 30 - 100 ng/mL    Comment: Vitamin D Status         25-OH Vitamin D: . Deficiency:                    <20 ng/mL Insufficiency:             20 - 29 ng/mL Optimal:                 > or = 30 ng/mL . For 25-OH Vitamin D testing on patients on  D2-supplementation and patients for whom quantitation  of D2 and D3 fractions is required, the QuestAssureD(TM) 25-OH VIT D, (D2,D3), LC/MS/MS is recommended: order  code 805 [id number] 7989 (patients >58yr). . For more information on this test, go to: http://education.questdiagnostics.com/faq/FAQ163 (This link is being provided for  informational/educational purposes only.)   B12     Status: Abnormal   Collection Time: 03/01/18  4:33 PM  Result Value Ref  Range   Vitamin B-12 >2,000 (H) 200 - 1,100 pg/mL  POCT glycosylated hemoglobin (Hb A1C)     Status: Abnormal   Collection Time: 03/06/18  8:22 AM  Result Value Ref Range   Hemoglobin A1C 5.6 4.0 - 5.6 %   HbA1c POC (<> result, manual entry)     HbA1c, POC (prediabetic range)     HbA1c, POC (controlled diabetic range)      No results found.   ROS: 14 pt review of systems performed and negative (unless mentioned in an HPI)  Objective: BP 134/72 (BP Location:  Left Arm, Patient Position: Sitting, Cuff Size: Large)   Pulse 78   Temp 98.1 F (36.7 C) (Oral)   Resp 16   Ht '6\' 2"'  (1.88 m)   Wt 289 lb (131.1 kg)   SpO2 98%   BMI 37.11 kg/m   Gen: Afebrile. No acute distress. Nontoxic in appearance. Well developed, well nourished.  Skin: prior symmetrical rash healing, right posterior calf rash 6-7 cm round lesion is improved but remains. No purpura or petechiae.  Neuro: Normal gait. PERLA. EOMi. Alert. Oriented x3   The biopsy was performed on the posterior right calf. The patient was consented for biopsy. The complications, instructions as to how the procedure will be performed, and postoperative instructions were given to the patient.  Intent: To remove a portion of skin so that it can be examined histologically. Location: right posterior calf PROCEDURE: The site was cleaned with antiseptic. 2cc of  local anesthetic (1% lidocaine w/ epi) injected surrounding x2 sites. A 3 mm punch biopsy was performed in the posterior calf x2, with careful attention to obtain both affected tissue and normal tissue. The site was then checked for bleeding. Once hemostasis was achieved, a local antibiotic ointment was placed and a pressure dressing applied. The patient was further instructed to keep the site completely dry for the next 24 hours, after which a new dressing and antibiotic ointment should be applied to the area. They were further instructed to avoid getting the site dirty or infected. The  patient completed the procedure without any complications and tolerated the the procedure well.  The biopsy will  be sent for analysis.  Assessment/plan: Javier Casey is a 62 y.o. male present for HTN follow up. arthralgia/Myalgia/Fatigue/rash - symmetrical rash in the  setting of increase fatigue and myalgia/arthralgia is concerning for an  autoimmune disease/dermatomyositis. Discussed this with him today in more detail. He would like to have skin biopsy today and labs to further evaluate.  - CK, aldolase, CRP, LDH, Anit Mi-2, cbc, and anti- jo collected today.  - x2 punch bx right calf completed today. Pt tolerated procedure well. (see procedure note above) - Allow bx to heal before using additional steroid cream directly over area. Bx sites with pressure dressing and Abx ointment. Keep clean and dry, covered with ointment and Band-Aid until completely healed. No standing water emersion until healed. Ok to shower daily and use of mild soap for cleansing.   Prior labs:  - Rocky mtn spotted fvr abs pnl(IgG+IgM)--> negative - Sedimentation rate--> wnl - B. burgdorfi antibodies--> negative - Vitamin D (25 hydroxy)--> WNL - B12--> WNL - TSH was normal a few months ago.  - CK normal 2016 and 2015 - ESR normal 2016 - ANA negative 2016 - RF negative 2016 - OTC pain relief for now. He does not desire narcotic. Need to figure out cause of pains in order to better treat. He is ok with OTC for now.  - Will refer to rheumatology after results received. If labs do not indicate inflammatory myopathy or AA d/o, he would still like to discuss with specialist the cause of symptoms and potential FQ syndrome.   Electronically signed by: Howard Pouch, DO [place]

## 2018-03-08 NOTE — Patient Instructions (Addendum)
Labs may shed some light on condition. Either way, next step would be rheumatology for further evaluation. The information we collected today will help them guide you better.  We will call you with all results once they are received. This can take up to a week to get some of these back.   Keep biopsy sites clean and dry. Cover daily with spot Band-Aid and neosporin until scabs forms, then can leave uncovered. Avoid standing water (pools, bathtubs, lakes etc) until completley healed. Ok to shower daily and clean with mild soap.  Also by our records, you may be due for your colonoscopy--> please call your GI to see if you are due.   Dermatomyositis Dermatomyositis is an acquired muscle disease. Dermatomyositis causes muscle weakness. It usually affects muscles that are closest to the trunk of your body. It can lead to difficulty rising from a sitting position, climbing stairs, lifting objects, or reaching overhead. The muscle weakness is usually accompanied by pain and swelling. What are the causes? The cause of dermatomyositis is unknown. What increases the risk? Females are more often affected than males. What are the signs or symptoms? Symptoms of dermatomyositis may include:  A rash. This is often seen before the muscle weakness occurs. It is bluish-purple. It appears on your face, neck, shoulders, upper chest, elbows, knees, knuckles, and back. The rash may be itchy.  Swelling of your face and eyelids.  Hardened bumps of calcium deposits under your skin (especially in children).  Trouble swallowing, talking, and breathing.  Occasionally, the muscles ache and are tender to touch.  Constant tiredness (fatigue).  Weight loss.  Low-grade fever.  An open skin wound (ulcer).  How is this diagnosed? Various tests may be done, including:  Blood tests. This can help determine if you have elevated levels of muscle enzymes such as creatine kinase and aldolase. Rises in these levels can  indicate muscle damage. Blood tests can also detect autoantibodies associated with different symptoms of dermatomyositis.  Electromyogram (EMG) or electromyography. This test shows whether your muscles are responding to electrical signals from your nerves in the proper manner.  Muscle biopsy. For this test, a surgical procedure is done to remove a small sample of tissue from a weakened muscle. This tissue sample is then looked at under a microscope.  Skin biopsy. For this test, a small sample of skin is taken from an area with a rash. The sample is then examined under a microscope.  Chest X-ray. This imaging test can check for signs of the type of lung damage that sometimes occurs with dermatomyositis.  MRI imaging. This test can be used to evaluate your muscles for signs of abnormal inflammation.  How is this treated? Treatment of dermatomyositis includes:  The use of prednisone and other drugs that suppress your immune system.  The use of immunoglobulin.  Physical therapy to prevent the thinning of your muscles (atrophy).  Follow these instructions at home:  Wear protective clothing and high-protection sunscreen when you go outside. Areas affected by the rash are more sensitive to the sun.  Only take over-the-counter or prescription medicines as directed by your health care provider. Contact a health care provider if:  Your condition worsens.  You have unexplained muscle weakness.  You develop a new rash.  You have a fever and cough. Get help right away if:  You have difficulty swallowing.  You have breathing problems. This information is not intended to replace advice given to you by your health care provider. Make  sure you discuss any questions you have with your health care provider. Document Released: 03/25/2002 Document Revised: 12/09/2015 Document Reviewed: 03/10/2013 Elsevier Interactive Patient Education  2017 [employer].

## 2018-03-11 LAB — LACTATE DEHYDROGENASE: LDH: 154 U/L (ref 120–250)

## 2018-03-11 LAB — ALDOLASE: ALDOLASE: 5 U/L (ref <=8.1)

## 2018-03-14 ENCOUNTER — Telehealth: Payer: Self-pay | Admitting: Family Medicine

## 2018-03-14 DIAGNOSIS — L300 Nummular dermatitis: Secondary | ICD-10-CM

## 2018-03-14 DIAGNOSIS — R5383 Other fatigue: Secondary | ICD-10-CM

## 2018-03-14 DIAGNOSIS — M791 Myalgia, unspecified site: Secondary | ICD-10-CM

## 2018-03-14 NOTE — Telephone Encounter (Signed)
Spoke with patient reviewed lab results and instructions. Patient verbalized understanding. 

## 2018-03-14 NOTE — Telephone Encounter (Signed)
Please inform patient the following information: So far, all his labs are normal. There is one lab that is a send out and will take 2-3 weeks to return. We will call him again when I do eventually get that back probably mid-sept.  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 There is unfortunately just no real treatment guideline or approach to this potential condition.

## 2018-03-15 ENCOUNTER — Telehealth: Payer: Self-pay | Admitting: Family Medicine

## 2018-03-15 LAB — MI-2 ANTIBODIES

## 2018-03-15 LAB — ANTI-JO 1 ANTIBODY, IGG

## 2018-03-15 NOTE — Telephone Encounter (Signed)
Spoke with patient reviewed lab results and information. Patient verbalized understanding . 

## 2018-03-15 NOTE — Telephone Encounter (Signed)
The last test we were told would be a few weeks to get results, actually resulted this morning and was normal also. 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 We are still attempting to get a rheumatology referral for him. The first referral was denied- likely from the lack of evidence and tx plan of this potential syndrome (quinolone syndrome). We will attempt a few more rheumatologist offices and if no one accepts the case then we can then try neurology.

## 2018-03-28 DIAGNOSIS — M791 Myalgia, unspecified site: Secondary | ICD-10-CM | POA: Diagnosis not present

## 2018-03-28 DIAGNOSIS — Z87898 Personal history of other specified conditions: Secondary | ICD-10-CM | POA: Diagnosis not present

## 2018-03-28 DIAGNOSIS — R5383 Other fatigue: Secondary | ICD-10-CM | POA: Diagnosis not present

## 2018-03-28 DIAGNOSIS — M2550 Pain in unspecified joint: Secondary | ICD-10-CM | POA: Diagnosis not present

## 2018-07-15 DIAGNOSIS — H25013 Cortical age-related cataract, bilateral: Secondary | ICD-10-CM | POA: Diagnosis not present

## 2018-10-03 ENCOUNTER — Other Ambulatory Visit: Payer: Self-pay

## 2018-10-03 ENCOUNTER — Ambulatory Visit: Payer: BLUE CROSS/BLUE SHIELD | Admitting: Family Medicine

## 2018-10-03 ENCOUNTER — Encounter: Payer: Self-pay | Admitting: Family Medicine

## 2018-10-03 ENCOUNTER — Ambulatory Visit: Payer: Self-pay

## 2018-10-03 VITALS — BP 114/68 | HR 104 | Temp 98.4°F | Resp 16 | Ht 74.0 in | Wt 264.2 lb

## 2018-10-03 DIAGNOSIS — K5792 Diverticulitis of intestine, part unspecified, without perforation or abscess without bleeding: Secondary | ICD-10-CM | POA: Diagnosis not present

## 2018-10-03 MED ORDER — METRONIDAZOLE 500 MG PO TABS: 500.0000 mg | ORAL_TABLET | Freq: Three times a day (TID) | ORAL | 0 refills | Status: AC

## 2018-10-03 MED ORDER — SULFAMETHOXAZOLE-TRIMETHOPRIM 800-160 MG PO TABS: 1.0000 | ORAL_TABLET | Freq: Two times a day (BID) | ORAL | 0 refills | Status: AC

## 2018-10-03 NOTE — Telephone Encounter (Signed)
Patient called in with c/o "abdominal pain." He says "I woke up this morning with lower abdominal pain, feeling sharp and cramps at a 4-5 pain. I had a normal bowel movement this morning and it didn't help the pain. I had the same thing about a month ago with diarrhea, but I didn't come see about it and eventually it went away. I had the pain on last Thursday without diarrhea, then it went away. Now, this is the 3rd time, so I figured I should come to see what's going on." I asked about other symptoms, he denies. According to protocol, see PCP within 24 hours, patient requests to be seen today and no availability with PCP, appointment scheduled for today at 1345 with Dr. Anitra Lauth, care advice given, patient verbalized understanding.  Reason for Disposition . [1] MODERATE pain (e.g., interferes with normal activities) AND [2] pain comes and goes (cramps) AND [3] present > 24 hours  (Exception: pain with Vomiting or Diarrhea - see that Guideline)  Answer Assessment - Initial Assessment Questions 1. LOCATION: "Where does it hurt?"      Lower abdominal pain right below the umbilicus 2. RADIATION: "Does the pain shoot anywhere else?" (e.g., chest, back)     No 3. ONSET: "When did the pain begin?" (Minutes, hours or days ago)      This morning 4. SUDDEN: "Gradual or sudden onset?"     Gradual 5. PATTERN "Does the pain come and go, or is it constant?"    - If constant: "Is it getting better, staying the same, or worsening?"      (Note: Constant means the pain never goes away completely; most serious pain is constant and it progresses)     - If intermittent: "How long does it last?" "Do you have pain now?"     (Note: Intermittent means the pain goes away completely between bouts)     Constant 6. SEVERITY: "How bad is the pain?"  (e.g., Scale 1-10; mild, moderate, or severe)    - MILD (1-3): doesn't interfere with normal activities, abdomen soft and not tender to touch     - MODERATE (4-7): interferes  with normal activities or awakens from sleep, tender to touch     - SEVERE (8-10): excruciating pain, doubled over, unable to do any normal activities       4-5 7. RECURRENT SYMPTOM: "Have you ever had this type of abdominal pain before?" If so, ask: "When was the last time?" and "What happened that time?"      Yes, a month ago very similar pain, treated at home; last Thursday  8. CAUSE: "What do you think is causing the abdominal pain?"     I don't know 9. RELIEVING/AGGRAVATING FACTORS: "What makes it better or worse?" (e.g., movement, antacids, bowel movement)     Nothing 10. OTHER SYMPTOMS: "Has there been any vomiting, diarrhea, constipation, or urine problems?"       No  Protocols used: ABDOMINAL PAIN - MALE-A-AH

## 2018-10-03 NOTE — Telephone Encounter (Signed)
Noted  

## 2018-10-03 NOTE — Progress Notes (Signed)
OFFICE VISIT  10/03/2018   CC:  Chief Complaint  Patient presents with  . Abdominal Pain    lower   HPI:    Patient is a 63 y.o. Caucasian male who presents for abdominal pain. Onset this morning, diffuse lower abd pain extending down into groin region as well. No nausea, constipation, or diarrhea.  No fever.   Some pain in groin including penis/urethra when he urinates. No blood in urine.  No pain in flank or back.  Appetite is down.  About 5 wks ago he had same thing for a week but had diarrhea with it.  It cleared after 1 wk. Imodium.  ROS: no CP, no SOB, no wheezing, no cough, no dizziness, no HAs, no rashes, no melena/hematochezia.  No polyuria or polydipsia.  No myalgias or arthralgias.    Past Medical History:  Diagnosis Date  . Allergy    swelling lips ,nose, hands and feet, tongue, but unknown reason  . Asthma   . Colon polyps   . Diverticulosis 2016   There was severe diverticulosis noted in the ascending to sigmoid colon  . Epididymal cyst 02/2016   Multiple on scrotal u/s  . GERD (gastroesophageal reflux disease)   . Hyperlipidemia   . Hypertension   . Hyperuricemia   . Tubular adenoma of colon 2009; 2016    Past Surgical History:  Procedure Laterality Date  . APPENDECTOMY    . colonoscopy with polypectomy  2009; 12/01/2014    GI.  +Diverticulosis  . WISDOM TOOTH EXTRACTION      Outpatient Medications Prior to Visit  Medication Sig Dispense Refill  . Cholecalciferol (VITAMIN D3) 10000 UNITS capsule Take 10,000 Units by mouth daily.    . Cyanocobalamin (B-12) 2500 MCG TABS Take by mouth daily.    . hydrochlorothiazide (HYDRODIURIL) 25 MG tablet Take 1 tablet (25 mg total) by mouth daily. 90 tablet 1  . losartan-hydrochlorothiazide (HYZAAR) 50-12.5 MG tablet Take 1 tablet by mouth daily. 90 tablet 1  . triamcinolone cream (KENALOG) 0.1 % Apply 1 application topically 2 (two) times daily. 80 g 2  . ezetimibe (ZETIA) 10 MG tablet Take 1 tablet (10  mg total) by mouth daily. (Patient not taking: Reported on 10/03/2018) 90 tablet 3  . ranitidine (ZANTAC) 150 MG tablet Take 150 mg by mouth as needed.      No facility-administered medications prior to visit.     Allergies  Allergen Reactions  . Ciprofloxacin     Severe swelling and rash  . Levofloxacin     Diffuse swelling with angioedema  . Pravastatin Other (See Comments)    myalgia  . Lipitor [Atorvastatin] Other (See Comments)    Myalgia     ROS As per HPI  PE: Blood pressure 114/68, pulse (!) 104, temperature 98.4 F (36.9 C), temperature source Oral, resp. rate 16, height 6\' 2"  (1.88 m), weight 264 lb 3.2 oz (119.8 kg), SpO2 97 %. Gen: Alert, well appearing.  Patient is oriented to person, place, time, and situation. AFFECT: pleasant, lucid thought and speech. GUY:QIHK: no injection, icteris, swelling, or exudate.  EOMI, PERRLA. Mouth: lips without lesion/swelling.  Oral mucosa pink and moist. Oropharynx without erythema, exudate, or swelling.  CV: Regular, mild tachy to 105 or so, no m/r/g.   LUNGS: CTA bilat, nonlabored resps, good aeration in all lung fields. ABD: soft, non distended, BS normal, no HSM, no bruit, no mass.  +rotund. He has signif TTP across entire lower abd diffusely.  No guarding  or rebound.  LABS:    Chemistry      Component Value Date/Time   NA 140 08/31/2017 1448   K 3.7 08/31/2017 1448   CL 102 08/31/2017 1448   CO2 29 08/31/2017 1448   BUN 16 08/31/2017 1448   CREATININE 1.37 08/31/2017 1448   CREATININE 1.65 (H) 09/06/2016 1548      Component Value Date/Time   CALCIUM 9.3 08/31/2017 1448   ALKPHOS 51 08/31/2017 1448   AST 13 08/31/2017 1448   ALT 16 08/31/2017 1448   BILITOT 0.6 08/31/2017 1448      Lab Results  Component Value Date   WBC 7.0 03/08/2018   HGB 15.4 03/08/2018   HCT 45.3 03/08/2018   MCV 86.5 03/08/2018   PLT 213.0 03/08/2018    IMPRESSION AND PLAN:  1) Acute diverticulitis. Plan: metronidazole 500 mg  tid x 14d + bactrim DS 1 bid x 14d (pt allergic to cipro). Liquid/bland/soft diet, and advance as tolerated.  An After Visit Summary was printed and given to the patient.  FOLLOW UP: Return if symptoms worsen or fail to improve.  Signed:  Crissie Sickles, MD           10/03/2018

## 2018-10-16 HISTORY — PX: PARTIAL COLECTOMY: SHX5273

## 2018-10-29 ENCOUNTER — Ambulatory Visit: Payer: Self-pay

## 2018-10-29 NOTE — Telephone Encounter (Signed)
Pt was called and appt was scheduled. Pt agreed with plan

## 2018-10-29 NOTE — Telephone Encounter (Signed)
Pt. Reports he was diagnosed with diverticulitis the end of March. Reports the pain has returned, "not as bad, but I don't want it to get worse." Pain started Saturday. Low abd., "right in the middle." Having bowel movements without difficulty. Spoke with Diane in the practice and forward triage to the office. Answer Assessment - Initial Assessment Questions 1. LOCATION: "Where does it hurt?"      Low abd. In the middle 2. RADIATION: "Does the pain shoot anywhere else?" (e.g., chest, back)     No 3. ONSET: "When did the pain begin?" (Minutes, hours or days ago)      Saturday 4. SUDDEN: "Gradual or sudden onset?"     Gradual 5. PATTERN "Does the pain come and go, or is it constant?"    - If constant: "Is it getting better, staying the same, or worsening?"      (Note: Constant means the pain never goes away completely; most serious pain is constant and it progresses)     - If intermittent: "How long does it last?" "Do you have pain now?"     (Note: Intermittent means the pain goes away completely between bouts)     Comes and goes 6. SEVERITY: "How bad is the pain?"  (e.g., Scale 1-10; mild, moderate, or severe)    - MILD (1-3): doesn't interfere with normal activities, abdomen soft and not tender to touch     - MODERATE (4-7): interferes with normal activities or awakens from sleep, tender to touch     - SEVERE (8-10): excruciating pain, doubled over, unable to do any normal activities       3-4 7. RECURRENT SYMPTOM: "Have you ever had this type of abdominal pain before?" If so, ask: "When was the last time?" and "What happened that time?"      Yes - Diverticulitis 8. CAUSE: "What do you think is causing the abdominal pain?"     Diverticulitis 9. RELIEVING/AGGRAVATING FACTORS: "What makes it better or worse?" (e.g., movement, antacids, bowel movement)     No 10. OTHER SYMPTOMS: "Has there been any vomiting, diarrhea, constipation, or urine problems?"       No  Protocols used: ABDOMINAL  PAIN - MALE-A-AH

## 2018-10-29 NOTE — Telephone Encounter (Signed)
Returned call to patient.  Unable to leave message. VM not set up.

## 2018-10-30 ENCOUNTER — Other Ambulatory Visit: Payer: Self-pay

## 2018-10-30 ENCOUNTER — Encounter: Payer: Self-pay | Admitting: Family Medicine

## 2018-10-30 ENCOUNTER — Ambulatory Visit (INDEPENDENT_AMBULATORY_CARE_PROVIDER_SITE_OTHER): Payer: BLUE CROSS/BLUE SHIELD | Admitting: Family Medicine

## 2018-10-30 VITALS — BP 120/57 | HR 85 | Ht 74.0 in | Wt 255.0 lb

## 2018-10-30 DIAGNOSIS — Z1211 Encounter for screening for malignant neoplasm of colon: Secondary | ICD-10-CM

## 2018-10-30 DIAGNOSIS — K5792 Diverticulitis of intestine, part unspecified, without perforation or abscess without bleeding: Secondary | ICD-10-CM | POA: Diagnosis not present

## 2018-10-30 DIAGNOSIS — D126 Benign neoplasm of colon, unspecified: Secondary | ICD-10-CM

## 2018-10-30 DIAGNOSIS — R1030 Lower abdominal pain, unspecified: Secondary | ICD-10-CM | POA: Diagnosis not present

## 2018-10-30 MED ORDER — AMOXICILLIN 875 MG PO TABS: 875.0000 mg | ORAL_TABLET | Freq: Three times a day (TID) | ORAL | 0 refills | Status: DC

## 2018-10-30 MED ORDER — SACCHAROMYCES BOULARDII 250 MG PO CAPS: ORAL_CAPSULE | ORAL | 0 refills | Status: DC

## 2018-10-30 NOTE — Patient Instructions (Signed)
- referral placed to gastroenterology--> they will call to schedule you. Even if all symptoms are resolved, you need you 3 yr follow up colonoscopy.   - Start amoxil (antibiotic) every 8 hours for 10 days.  - start probiotic 2 caps every 12 hours for 4 weeks, then 1 cap every 12 hours for 8 weeks to promote and maintain intestinal health.  - Liquid or bland diet the next 3-4 days. Slowly introduce your normal foods if tolerating the bland diet.  If fever, blood in stool or pain worsens--> please call in immediately to be seen or go to ED. This would would indicate a more severe infection that would need emergent labs, imaging and IV antibiotics.    Diverticulitis  Diverticulitis is when small pockets in your large intestine (colon) get infected or swollen. This causes stomach pain and watery poop (diarrhea). These pouches are called diverticula. They form in people who have a condition called diverticulosis. Follow these instructions at home: Medicines  Take over-the-counter and prescription medicines only as told by your doctor. These include: ? Antibiotics. ? Pain medicines. ? Fiber pills. ? Probiotics. ? Stool softeners.  Do not drive or use heavy machinery while taking prescription pain medicine.  If you were prescribed an antibiotic, take it as told. Do not stop taking it even if you feel better. General instructions   Follow a diet as told by your doctor.  When you feel better, your doctor may tell you to change your diet. You may need to eat a lot of fiber. Fiber makes it easier to poop (have bowel movements). Healthy foods with fiber include: ? Berries. ? Beans. ? Lentils. ? Green vegetables.  Exercise 3 or more times a week. Aim for 30 minutes each time. Exercise enough to sweat and make your heart beat faster.  Keep all follow-up visits as told. This is important. You may need to have an exam of the large intestine. This is called a colonoscopy. Contact a doctor if:   Your pain does not get better.  You have a hard time eating or drinking.  You are not pooping like normal. Get help right away if:  Your pain gets worse.  Your problems do not get better.  Your problems get worse very fast.  You have a fever.  You throw up (vomit) more than one time.  You have poop that is: ? Bloody. ? Black. ? Tarry. Summary  Diverticulitis is when small pockets in your large intestine (colon) get infected or swollen.  Take medicines only as told by your doctor.  Follow a diet as told by your doctor. This information is not intended to replace advice given to you by your health care provider. Make sure you discuss any questions you have with your health care provider. Document Released: 12/20/2007 Document Revised: 07/20/2016 Document Reviewed: 07/20/2016 Elsevier Interactive Patient Education  2019 [place] Diet A bland diet consists of foods that are often soft and do not have a lot of fat, fiber, or extra seasonings. Foods without fat, fiber, or seasoning are easier for the body to digest. They are also less likely to irritate your mouth, throat, stomach, and other parts of your digestive system. A bland diet is sometimes called a BRAT diet. What is my plan? Your health care provider or food and nutrition specialist (dietitian) may recommend specific changes to your diet to prevent symptoms or to treat your symptoms. These changes may include:  Eating small meals  often.  Cooking food until it is soft enough to chew easily.  Chewing your food well.  Drinking fluids slowly.  Not eating foods that are very spicy, sour, or fatty.  Not eating citrus fruits, such as oranges and grapefruit. What do I need to know about this diet?  Eat a variety of foods from the bland diet food list.  Do not follow a bland diet longer than needed.  Ask your health care provider whether you should take vitamins or supplements. What foods can I  eat? Grains  Hot cereals, such as cream of wheat. Rice. Bread, crackers, or tortillas made from refined white flour. Vegetables Canned or cooked vegetables. Mashed or boiled potatoes. Fruits  Bananas. Applesauce. Other types of cooked or canned fruit with the skin and seeds removed, such as canned peaches or pears. Meats and other proteins  Scrambled eggs. Creamy peanut butter or other nut butters. Lean, well-cooked meats, such as chicken or fish. Tofu. Soups or broths. Dairy Low-fat dairy products, such as milk, cottage cheese, or yogurt. Beverages  Water. Herbal tea. Apple juice. Fats and oils Mild salad dressings. Canola or olive oil. Sweets and desserts Pudding. Custard. Fruit gelatin. Ice cream. The items listed above may not be a complete list of recommended foods and beverages. Contact a dietitian for more options. What foods are not recommended? Grains Whole grain breads and cereals. Vegetables Raw vegetables. Fruits Raw fruits, especially citrus, berries, or dried fruits. Dairy Whole fat dairy foods. Beverages Caffeinated drinks. Alcohol. Seasonings and condiments Strongly flavored seasonings or condiments. Hot sauce. Salsa. Other foods Spicy foods. Fried foods. Sour foods, such as pickled or fermented foods. Foods with high sugar content. Foods high in fiber. The items listed above may not be a complete list of foods and beverages to avoid. Contact a dietitian for more information. Summary  A bland diet consists of foods that are often soft and do not have a lot of fat, fiber, or extra seasonings.  Foods without fat, fiber, or seasoning are easier for the body to digest.  Check with your health care provider to see how long you should follow this diet plan. It is not meant to be followed for long periods. This information is not intended to replace advice given to you by your health care provider. Make sure you discuss any questions you have with your health  care provider. Document Released: 10/25/2015 Document Revised: 08/01/2017 Document Reviewed: 08/01/2017 Elsevier Interactive Patient Education  2019 [employer].

## 2018-10-30 NOTE — Progress Notes (Signed)
Virtual Visit via Video   I connected with Javier Casey on 10/30/18 at  9:20 AM EDT by a video enabled telemedicine application and verified that I am speaking with the correct person using two identifiers. Location patient: Home Location provider: [facility], Office Persons participating in the virtual visit: Patient, Dr. Raoul Pitch and R.Baker, LPN  I discussed the limitations of evaluation and management by telemedicine and the availability of in person appointments. The patient expressed understanding and agreed to proceed.  Subjective:   Chief Complaint  Patient presents with  . Abdominal Pain    Normal Bowel movements. Finsihed full course of abx and was better x1 week. Abdominal pain is across lower abd that comes and goes     HPI:  Javier Casey is a 63 y.o. male pt present for recurring Patient was lower abdominal pain. He was seen  by my partner, Dr. Anitra Lauth, on 10/03/2018 and diagnosed with acute diverticulitis.  He was treated with Bactrim (FQ allergic) and Flagyl.  Mr. Dipiero reports he had complete resolution of symptoms for approximately 1 week after completing the antibiotics.  His symptoms then slowly started to return 5 days ago.  He states his mid lower abdomen discomfort has slowly started to increase again over this time.  It has not reached quite as bad as when he was seen a few weeks ago.  He denies any current fever, chills, nausea, vomit, diarrhea or bloody stools.  He is having normal bowel movements and normal urinary output.  He states it is painful when he bears down to have his bowel movement. He has a history of multiple colon polyps and severe diverticulitis from his ascending to sigmoid colon.  He is overdue for his 3-year follow-up colonoscopy that was due in 2019.  Prior to his first episode last month he has never had a diverticular flare or abdominal pain such as this.  ROS: See pertinent positives and negatives per HPI.  Patient Active Problem  List   Diagnosis Date Noted  . Nummular eczema 03/14/2018  . Prediabetes 02/26/2017  . CKD (chronic kidney disease) stage 3, GFR 30-59 ml/min (HCC) 09/07/2016  . Swelling of both hands 11/12/2014  . Morbid obesity ([place]) 11/12/2014  . Vitamin B12 deficiency 01/12/2014  . 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 HYPERTENSION, BENIGN ESSENTIAL 06/16/2009  . Hyperlipidemia LDL goal <130 02/26/2008    Social History   Tobacco Use  . Smoking status: Never Smoker  . Smokeless tobacco: Never Used  Substance Use Topics  . Alcohol use: Yes    Alcohol/week: 3.0 standard drinks    Types: 3 Cans of beer per week    Comment: < 8 glasses / week of wine    Current Outpatient Medications:  .  Cholecalciferol (VITAMIN D3) 10000 UNITS capsule, Take 10,000 Units by mouth daily., Disp: , Rfl:  .  Cyanocobalamin (B-12) 2500 MCG TABS, Take by mouth daily., Disp: , Rfl:  .  hydrochlorothiazide (HYDRODIURIL) 25 MG tablet, Take 1 tablet (25 mg total) by mouth daily., Disp: 90 tablet, Rfl: 1 .  losartan-hydrochlorothiazide (HYZAAR) 50-12.5 MG tablet, Take 1 tablet by mouth daily., Disp: 90 tablet, Rfl: 1 .  triamcinolone cream (KENALOG) 0.1 %, Apply 1 application topically 2 (two) times daily., Disp: 80 g, Rfl: 2 .  amoxicillin (AMOXIL) 875 MG tablet, Take 1 tablet (875 mg total) by mouth 3 (three) times daily., Disp: 30 tablet, Rfl: 0 .  saccharomyces boulardii (FLORASTOR) 250 MG capsule,  2 capsules PO BID for 4 weeks, then 1 capsule PO  BID for 8 weeks, Disp: 240 capsule, Rfl: 0  Allergies  Allergen Reactions  . Ciprofloxacin     Severe swelling and rash  . Levofloxacin     Diffuse swelling with angioedema  . Pravastatin Other (See Comments)    myalgia  . Lipitor [Atorvastatin] Other (See Comments)    Myalgia     Objective:  BP (!) 120/57   Pulse 85   Ht 6\' 2"  (1.88 m)   Wt 255 lb (115.7 kg)   BMI 32.74 kg/m  Gen: No acute distress. Nontoxic in appearance.  HENT: AT. [place].  MMM.   Eyes:Conjunctiva without redness, discharge or icterus. Chest: Cough or shortness of breath not present.  Abdomen: Pain lower abdomen midline. Skin: no rashes, purpura or petechiae.  Neuro: norrmal gait. Alert. Oriented x3  Psych: Normal affect, dress and demeanor. Normal speech. Normal thought content and judgment.   Assessment and Plan:  Javier Casey is a 63 y.o. male present to follow up on abd pain  Acute diverticulitis/colon polyps - Recurrence/incomplete treatment of diverticulitis suspected. problem new to this provider.  -Amoxil 875 3 times daily for 10 days. -Florastor 500 mg twice daily for 4 weeks, and then 250 mg twice daily for 8 weeks. -Hydrate. -Clear diet to bland diet, and then slowly start to reintroduce normal foods after 3 to 4 days if tolerating. -Education on diverticulitis and bland diet provided. -Referral to his gastroenterologist, Dr. Hilarie Fredrickson.  Overdue for his 3-year follow-up for colonoscopy, and he will need follow-up on his reoccurring suspected diverticulitis. -He was advised if he develops any fevers, chills, nausea, vomit, bloody stools or increased pain taking antibiotics he will need to be seen emergently.  If symptoms do not completely resolve prior to seeing gastroenterology, after his antibiotics are completed, he will need to call in to be seen and labs/imaging will need to be completed for further evaluation.  Patient is aware and understands instructions.  > 25 minutes spent with patient, >50% of time spent face to face    Howard Pouch, DO 10/30/2018

## 2018-11-07 ENCOUNTER — Encounter: Payer: Self-pay | Admitting: *Deleted

## 2018-11-11 ENCOUNTER — Ambulatory Visit (INDEPENDENT_AMBULATORY_CARE_PROVIDER_SITE_OTHER): Payer: BLUE CROSS/BLUE SHIELD | Admitting: Internal Medicine

## 2018-11-11 ENCOUNTER — Encounter: Payer: Self-pay | Admitting: Internal Medicine

## 2018-11-11 ENCOUNTER — Other Ambulatory Visit: Payer: Self-pay

## 2018-11-11 VITALS — Ht 73.0 in | Wt 255.0 lb

## 2018-11-11 DIAGNOSIS — N390 Urinary tract infection, site not specified: Secondary | ICD-10-CM | POA: Diagnosis not present

## 2018-11-11 DIAGNOSIS — K5730 Diverticulosis of large intestine without perforation or abscess without bleeding: Secondary | ICD-10-CM | POA: Diagnosis not present

## 2018-11-11 DIAGNOSIS — R319 Hematuria, unspecified: Secondary | ICD-10-CM | POA: Diagnosis not present

## 2018-11-11 DIAGNOSIS — Z79899 Other long term (current) drug therapy: Secondary | ICD-10-CM | POA: Diagnosis not present

## 2018-11-11 DIAGNOSIS — A419 Sepsis, unspecified organism: Secondary | ICD-10-CM | POA: Diagnosis not present

## 2018-11-11 DIAGNOSIS — N200 Calculus of kidney: Secondary | ICD-10-CM | POA: Diagnosis not present

## 2018-11-11 DIAGNOSIS — Z7952 Long term (current) use of systemic steroids: Secondary | ICD-10-CM | POA: Diagnosis not present

## 2018-11-11 DIAGNOSIS — R1032 Left lower quadrant pain: Secondary | ICD-10-CM | POA: Diagnosis not present

## 2018-11-11 DIAGNOSIS — Z881 Allergy status to other antibiotic agents status: Secondary | ICD-10-CM | POA: Diagnosis not present

## 2018-11-11 DIAGNOSIS — K5732 Diverticulitis of large intestine without perforation or abscess without bleeding: Secondary | ICD-10-CM | POA: Diagnosis not present

## 2018-11-11 DIAGNOSIS — R197 Diarrhea, unspecified: Secondary | ICD-10-CM | POA: Diagnosis not present

## 2018-11-11 DIAGNOSIS — R109 Unspecified abdominal pain: Secondary | ICD-10-CM | POA: Diagnosis not present

## 2018-11-11 DIAGNOSIS — K631 Perforation of intestine (nontraumatic): Secondary | ICD-10-CM | POA: Diagnosis not present

## 2018-11-11 DIAGNOSIS — K5720 Diverticulitis of large intestine with perforation and abscess without bleeding: Secondary | ICD-10-CM | POA: Insufficient documentation

## 2018-11-11 DIAGNOSIS — K5792 Diverticulitis of intestine, part unspecified, without perforation or abscess without bleeding: Secondary | ICD-10-CM

## 2018-11-11 DIAGNOSIS — R000 Tachycardia, unspecified: Secondary | ICD-10-CM | POA: Diagnosis not present

## 2018-11-11 DIAGNOSIS — I10 Essential (primary) hypertension: Secondary | ICD-10-CM | POA: Diagnosis not present

## 2018-11-11 DIAGNOSIS — Z4682 Encounter for fitting and adjustment of non-vascular catheter: Secondary | ICD-10-CM | POA: Diagnosis not present

## 2018-11-11 DIAGNOSIS — R1084 Generalized abdominal pain: Secondary | ICD-10-CM | POA: Diagnosis not present

## 2018-11-11 DIAGNOSIS — N321 Vesicointestinal fistula: Secondary | ICD-10-CM | POA: Insufficient documentation

## 2018-11-11 DIAGNOSIS — R3989 Other symptoms and signs involving the genitourinary system: Secondary | ICD-10-CM | POA: Diagnosis not present

## 2018-11-11 DIAGNOSIS — K5780 Diverticulitis of intestine, part unspecified, with perforation and abscess without bleeding: Secondary | ICD-10-CM | POA: Diagnosis not present

## 2018-11-11 DIAGNOSIS — E785 Hyperlipidemia, unspecified: Secondary | ICD-10-CM | POA: Diagnosis not present

## 2018-11-11 DIAGNOSIS — K567 Ileus, unspecified: Secondary | ICD-10-CM | POA: Diagnosis not present

## 2018-11-12 DIAGNOSIS — K631 Perforation of intestine (nontraumatic): Secondary | ICD-10-CM | POA: Diagnosis not present

## 2018-11-12 DIAGNOSIS — N321 Vesicointestinal fistula: Secondary | ICD-10-CM | POA: Diagnosis not present

## 2018-11-12 DIAGNOSIS — R1032 Left lower quadrant pain: Secondary | ICD-10-CM | POA: Diagnosis not present

## 2018-11-12 DIAGNOSIS — K5720 Diverticulitis of large intestine with perforation and abscess without bleeding: Secondary | ICD-10-CM | POA: Diagnosis not present

## 2018-11-12 NOTE — Progress Notes (Signed)
Patient canceled appt as he was hospitalized with diverticulitis

## 2018-11-14 DIAGNOSIS — N200 Calculus of kidney: Secondary | ICD-10-CM | POA: Diagnosis not present

## 2018-11-16 DIAGNOSIS — Z4682 Encounter for fitting and adjustment of non-vascular catheter: Secondary | ICD-10-CM | POA: Diagnosis not present

## 2018-11-16 DIAGNOSIS — K5730 Diverticulosis of large intestine without perforation or abscess without bleeding: Secondary | ICD-10-CM | POA: Diagnosis not present

## 2018-12-04 ENCOUNTER — Encounter: Payer: Self-pay | Admitting: Family Medicine

## 2018-12-04 ENCOUNTER — Ambulatory Visit: Payer: BLUE CROSS/BLUE SHIELD

## 2018-12-04 ENCOUNTER — Ambulatory Visit (INDEPENDENT_AMBULATORY_CARE_PROVIDER_SITE_OTHER): Payer: BLUE CROSS/BLUE SHIELD | Admitting: Family Medicine

## 2018-12-04 ENCOUNTER — Other Ambulatory Visit: Payer: Self-pay

## 2018-12-04 VITALS — BP 119/74 | HR 78 | Temp 97.7°F | Ht 73.0 in | Wt 247.0 lb

## 2018-12-04 DIAGNOSIS — R300 Dysuria: Secondary | ICD-10-CM | POA: Diagnosis not present

## 2018-12-04 DIAGNOSIS — N390 Urinary tract infection, site not specified: Secondary | ICD-10-CM

## 2018-12-04 DIAGNOSIS — Z433 Encounter for attention to colostomy: Secondary | ICD-10-CM | POA: Diagnosis not present

## 2018-12-04 MED ORDER — AMOXICILLIN-POT CLAVULANATE 875-125 MG PO TABS: 1.0000 | ORAL_TABLET | Freq: Two times a day (BID) | ORAL | 0 refills | Status: DC

## 2018-12-04 NOTE — Progress Notes (Signed)
VIRTUAL VISIT VIA VIDEO  I connected with Javier Casey on 12/04/18 at 10:40 AM EDT by a video enabled telemedicine application and verified that I am speaking with the correct person using two identifiers. Location patient: Home Location provider: [facility], Office Persons participating in the virtual visit: Patient, Dr. Raoul Pitch and R.Baker, LPN  I discussed the limitations of evaluation and management by telemedicine and the availability of in person appointments. The patient expressed understanding and agreed to proceed.  Interactive audio and video telecommunications were attempted between this provider and patient, however failed, due to patient having technical difficulties OR patient did not have access to video capability. We continued and completed visit with audio only.    SUBJECTIVE Chief Complaint  Patient presents with  . Urinary Frequency    painful urination all night last night. Pt started a RX for penicillen that his dentist had given him   . Urinary Retention    HPI:  Dysuria:  Javier Casey is a 63 y.o. male present for dysuria of 24 hours.  He states last night he started to have dysuria, urinary frequency and straining with urination.  He denies any history of kidney stones.  He denies hematuria.  He had recent colectomy with colostomy secondary to diverticulitis.  He did have a Foley catheter in place for 2 weeks, that was removed 1 week ago today.  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  Patient states he was in quite a bit of discomfort over his bladder area last night and took penicillin 500 mg every 6 hours that he had leftover from a dental procedure.  He reports this has greatly improved his symptoms.  He denies any fever, chills, nausea, vomit or low back pain.  He is allergic to fluoroquinolones.   ROS: See pertinent positives and negatives per HPI.  Patient Active Problem List   Diagnosis Date Noted  . Nummular eczema 03/14/2018  . Prediabetes 02/26/2017  . CKD (chronic kidney disease) stage 3, GFR 30-59 ml/min (HCC) 09/07/2016  . Swelling of both hands 11/12/2014  . Morbid obesity ([place]) 11/12/2014  . Vitamin B12 deficiency 01/12/2014  . 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 HYPERTENSION, BENIGN ESSENTIAL 06/16/2009  . Hyperlipidemia LDL goal <130 02/26/2008    Social History   Tobacco Use  . Smoking status: Never Smoker  . Smokeless tobacco: Never Used  Substance Use Topics  . Alcohol use: Yes    Comment: no alcohol since 06/2018    Current Outpatient Medications:  .  Cholecalciferol (VITAMIN D3) 10000 UNITS capsule, Take 10,000 Units by mouth daily., Disp: , Rfl:  .  Cyanocobalamin (B-12) 2500 MCG TABS, Take by mouth daily., Disp: , Rfl:  .  losartan-hydrochlorothiazide (HYZAAR) 50-12.5 MG tablet, Take 1 tablet by mouth daily., Disp: 90 tablet, Rfl: 1 .  penicillin v potassium (VEETID) 500 MG tablet, Take 500 mg by mouth 4 (four) times daily., Disp: , Rfl:  .  saccharomyces boulardii (FLORASTOR) 250 MG capsule, 2 capsules PO BID for 4 weeks, then 1 capsule PO  BID for 8 weeks (Patient taking differently: Take 500 mg by mouth 2 (two) times daily. ), Disp: 240 capsule, Rfl: 0 .  triamcinolone cream (KENALOG) 0.1 %, Apply 1 application topically 2 (two) times daily. (Patient taking differently: Apply 1 application topically 2 (  two) times daily. As needed), Disp: 80 g, Rfl: 2  Allergies  Allergen Reactions  . Ciprofloxacin     Severe swelling and rash  . Levofloxacin     Diffuse swelling with angioedema  . Pravastatin Other (See Comments)    myalgia  . Lipitor [Atorvastatin] Other (See Comments)    Myalgia     OBJECTIVE: BP 119/74   Pulse 78   Temp 97.7 F (36.5 C) (Oral)   Ht 6\' 1"  (1.854 m)   Wt 247 lb (112 kg)   BMI 32.59 kg/m  Gen: No acute  distress. Nontoxic in appearance.  Chest: Cough or shortness of breath not present Neuro:  Alert. Oriented x3   ASSESSMENT AND PLAN: Javier Casey is a 63 y.o. male present for  Dysuria -Suspect UTI.  Patient will present to the office to have a urinalysis completed, order placed. -Daily would have covered with fluoroquinolone with catheter associated UTI suspected.  However, since he is allergic to fluoroquinolones, and seems to have made some improvement with the use of penicillin-will start Augmentin twice daily x10 days. - Urinalysis w microscopic + reflex cultur; Future -Patient was provided with education on need for emergent follow-up including fever, chills, nausea, vomit, low back pain, worsening abdominal pain or urinary retention. -Follow-up dependent upon lab results.   Howard Pouch, DO 12/04/2018

## 2018-12-04 NOTE — Addendum Note (Signed)
Addended by: Caroll Rancher L on: 12/04/2018 12:32 PM   Modules accepted: Orders

## 2018-12-05 DIAGNOSIS — Z933 Colostomy status: Secondary | ICD-10-CM | POA: Diagnosis not present

## 2018-12-05 DIAGNOSIS — K5732 Diverticulitis of large intestine without perforation or abscess without bleeding: Secondary | ICD-10-CM | POA: Diagnosis not present

## 2018-12-06 ENCOUNTER — Telehealth: Payer: Self-pay | Admitting: Family Medicine

## 2018-12-06 LAB — URINALYSIS W MICROSCOPIC + REFLEX CULTURE
Bacteria, UA: NONE SEEN /HPF
Bilirubin Urine: NEGATIVE
Glucose, UA: NEGATIVE
Hyaline Cast: NONE SEEN /LPF
Ketones, ur: NEGATIVE
Nitrites, Initial: NEGATIVE
Specific Gravity, Urine: 1.018 (ref 1.001–1.03)
Squamous Epithelial / HPF: NONE SEEN /HPF (ref <=5)
WBC, UA: >=60 /HPF — AB (ref 0–5)
pH: 5.5 (ref 5.0–8.0)

## 2018-12-06 LAB — URINE CULTURE
MICRO NUMBER:: 495859
SPECIMEN QUALITY:: ADEQUATE

## 2018-12-06 LAB — CULTURE INDICATED

## 2018-12-06 NOTE — Telephone Encounter (Signed)
noted 

## 2018-12-06 NOTE — Telephone Encounter (Signed)
Please call Javier Casey and let him know his culture is not resulted yet. Since it is the holiday weekend we will not get those results until Tuesday.   His urine did appear to have an infection by the initial analysis.   I wanted to check in with him before the holiday weekend to make sure he was seeing improvement- since we will not get the culture back before the weekend?  Please advise

## 2018-12-06 NOTE — Telephone Encounter (Signed)
Pt is doing much better with no symptoms or complaints

## 2018-12-24 DIAGNOSIS — Z933 Colostomy status: Secondary | ICD-10-CM | POA: Diagnosis not present

## 2019-01-06 DIAGNOSIS — Z433 Encounter for attention to colostomy: Secondary | ICD-10-CM | POA: Diagnosis not present

## 2019-01-06 DIAGNOSIS — K219 Gastro-esophageal reflux disease without esophagitis: Secondary | ICD-10-CM | POA: Diagnosis not present

## 2019-01-06 DIAGNOSIS — E669 Obesity, unspecified: Secondary | ICD-10-CM | POA: Diagnosis not present

## 2019-01-06 DIAGNOSIS — I10 Essential (primary) hypertension: Secondary | ICD-10-CM | POA: Diagnosis not present

## 2019-01-06 DIAGNOSIS — E785 Hyperlipidemia, unspecified: Secondary | ICD-10-CM | POA: Diagnosis not present

## 2019-01-06 DIAGNOSIS — Z79899 Other long term (current) drug therapy: Secondary | ICD-10-CM | POA: Diagnosis not present

## 2019-01-06 DIAGNOSIS — Z883 Allergy status to other anti-infective agents status: Secondary | ICD-10-CM | POA: Diagnosis not present

## 2019-01-06 DIAGNOSIS — Z6832 Body mass index (BMI) 32.0-32.9, adult: Secondary | ICD-10-CM | POA: Diagnosis not present

## 2019-01-06 DIAGNOSIS — Z881 Allergy status to other antibiotic agents status: Secondary | ICD-10-CM | POA: Diagnosis not present

## 2019-01-09 DIAGNOSIS — Z433 Encounter for attention to colostomy: Secondary | ICD-10-CM | POA: Diagnosis not present

## 2019-01-09 DIAGNOSIS — Z933 Colostomy status: Secondary | ICD-10-CM | POA: Insufficient documentation

## 2019-01-09 HISTORY — DX: Colostomy status: Z93.3

## 2019-01-13 MED ORDER — MORPHINE SULFATE (PF) 2 MG/ML IV SOLN
2.00 | INTRAVENOUS | Status: DC
Start: ? — End: 2019-01-13

## 2019-01-13 MED ORDER — ALVIMOPAN 12 MG PO CAPS
12.00 | ORAL_CAPSULE | ORAL | Status: DC
Start: 2019-01-12 — End: 2019-01-13

## 2019-01-13 MED ORDER — ENOXAPARIN SODIUM 40 MG/0.4ML ~~LOC~~ SOLN
40.00 | SUBCUTANEOUS | Status: DC
Start: 2019-01-13 — End: 2019-01-13

## 2019-01-13 MED ORDER — FAMOTIDINE 20 MG/2ML IV SOLN
20.00 | INTRAVENOUS | Status: DC
Start: ? — End: 2019-01-13

## 2019-01-13 MED ORDER — GENERIC EXTERNAL MEDICATION
Status: DC
Start: ? — End: 2019-01-13

## 2019-01-13 MED ORDER — OXYCODONE HCL 5 MG PO TABS
5.00 | ORAL_TABLET | ORAL | Status: DC
Start: ? — End: 2019-01-13

## 2019-01-13 MED ORDER — GENERIC EXTERNAL MEDICATION
Status: DC
Start: 2019-01-13 — End: 2019-01-13

## 2019-01-13 MED ORDER — SODIUM CHLORIDE 0.9 % IV SOLN
10.00 | INTRAVENOUS | Status: DC
Start: ? — End: 2019-01-13

## 2019-01-13 MED ORDER — MORPHINE SULFATE (PF) 4 MG/ML IV SOLN
4.00 | INTRAVENOUS | Status: DC
Start: ? — End: 2019-01-13

## 2019-01-13 MED ORDER — DIPHENHYDRAMINE HCL 50 MG/ML IJ SOLN
25.00 | INTRAMUSCULAR | Status: DC
Start: ? — End: 2019-01-13

## 2019-01-13 MED ORDER — OXYCODONE HCL 5 MG PO TABS
10.00 | ORAL_TABLET | ORAL | Status: DC
Start: ? — End: 2019-01-13

## 2019-04-17 ENCOUNTER — Telehealth: Payer: Self-pay | Admitting: Family Medicine

## 2019-04-17 NOTE — Telephone Encounter (Signed)
Patient is experiencing urinary frequency with very little to void. Patient is also experiencing pain. Patient is requesting an appointment with PCP.

## 2019-04-18 ENCOUNTER — Other Ambulatory Visit: Payer: Self-pay

## 2019-04-18 ENCOUNTER — Encounter: Payer: Self-pay | Admitting: Family Medicine

## 2019-04-18 ENCOUNTER — Ambulatory Visit: Payer: BC Managed Care – PPO | Admitting: Family Medicine

## 2019-04-18 VITALS — BP 128/76 | HR 81 | Temp 98.0°F | Resp 16 | Ht 73.0 in | Wt 240.5 lb

## 2019-04-18 DIAGNOSIS — R8290 Unspecified abnormal findings in urine: Secondary | ICD-10-CM

## 2019-04-18 DIAGNOSIS — Z23 Encounter for immunization: Secondary | ICD-10-CM

## 2019-04-18 DIAGNOSIS — R300 Dysuria: Secondary | ICD-10-CM | POA: Diagnosis not present

## 2019-04-18 LAB — POC URINALSYSI DIPSTICK (AUTOMATED)
Bilirubin, UA: NEGATIVE
Blood, UA: NEGATIVE
Glucose, UA: NEGATIVE
Ketones, UA: NEGATIVE
Nitrite, UA: POSITIVE
Protein, UA: NEGATIVE
Spec Grav, UA: 1.02 (ref 1.010–1.025)
Urobilinogen, UA: 0.2 E.U./dL
pH, UA: 6 (ref 5.0–8.0)

## 2019-04-18 MED ORDER — SULFAMETHOXAZOLE-TRIMETHOPRIM 800-160 MG PO TABS: 1.0000 | ORAL_TABLET | Freq: Two times a day (BID) | ORAL | 0 refills | Status: DC

## 2019-04-18 NOTE — Progress Notes (Signed)
Javier Casey , [birth year], 63 y.o., male MRN: [id number] Patient Care Team    Relationship Specialty Notifications Start End  Ma Hillock, DO PCP - General Family Medicine  08/13/15   Pyrtle, Lajuan Lines, MD Consulting Physician Gastroenterology  02/18/16     Chief Complaint  Patient presents with  . Urinary Frequency    hurts when he uses the restroom and in between the restroom. voiding very little. no fever. no blood or discharge. urine is amber in color     Subjective: Pt presents for an OV with complaints of dysuria worsening over the last few days, but admits he does not feel it completely went away from infection in July. He was treated for infection in May and July s/p partial colectomy for diverticulitis.  He denies abd pain. fever, chills, nausea vomit or back pain.  Depression screen [facility] 2/9 03/01/2018 02/23/2017 02/18/2016  Decreased Interest 0 0 0  Down, Depressed, Hopeless 0 0 0  PHQ - 2 Score 0 0 0    Allergies  Allergen Reactions  . Ciprofloxacin     Severe swelling and rash  . Levofloxacin     Diffuse swelling with angioedema  . Pravastatin Other (See Comments)    myalgia  . Lipitor [Atorvastatin] Other (See Comments)    Myalgia    Social History   Social History Narrative  . Not on file   Past Medical History:  Diagnosis Date  . Allergy    swelling lips ,nose, hands and feet, tongue, but unknown reason  . Asthma   . Colon polyps   . Diverticulitis   . Diverticulosis 2016   There was severe diverticulosis noted in the ascending to sigmoid colon  . Epididymal cyst 02/2016   Multiple on scrotal u/s  . GERD (gastroesophageal reflux disease)   . Hyperlipidemia   . Hypertension   . Hyperuricemia   . Tubular adenoma of colon 2009; 2016   Past Surgical History:  Procedure Laterality Date  . APPENDECTOMY    . colonoscopy with polypectomy  2009; 12/01/2014   [facility].  +Diverticulosis  . WISDOM TOOTH EXTRACTION     Family History  Problem Relation  Age of Onset  . Coronary artery disease Mother 52       died post CBAG  . Allergic Disorder Daughter   . Allergic Disorder Son   . Cancer Neg Hx   . Stroke Neg Hx   . Diabetes Neg Hx   . Colon cancer Neg Hx   . Colon polyps Neg Hx   . Esophageal cancer Neg Hx   . Stomach cancer Neg Hx   . Pancreatic cancer Neg Hx   . Liver disease Neg Hx   . Inflammatory bowel disease Neg Hx    Allergies as of 04/18/2019      Reactions   Ciprofloxacin    Severe swelling and rash   Levofloxacin    Diffuse swelling with angioedema   Pravastatin Other (See Comments)   myalgia   Lipitor [atorvastatin] Other (See Comments)   Myalgia       Medication List       Accurate as of April 18, 2019  9:57 AM. If you have any questions, ask your nurse or doctor.        amoxicillin-clavulanate 875-125 MG tablet Commonly known as: AUGMENTIN Take 1 tablet by mouth 2 (two) times daily.   B-12 2500 MCG Tabs Take by mouth daily.   losartan-hydrochlorothiazide 50-12.5 MG tablet  Commonly known as: HYZAAR Take 1 tablet by mouth daily.   OMEGA-3 2100 PO Take by mouth.   saccharomyces boulardii 250 MG capsule Commonly known as: Florastor 2 capsules PO BID for 4 weeks, then 1 capsule PO  BID for 8 weeks   triamcinolone cream 0.1 % Commonly known as: KENALOG Apply 1 application topically 2 (two) times daily.   Vitamin D3 250 MCG (10000 UT) capsule Take 10,000 Units by mouth daily.       All past medical history, surgical history, allergies, family history, immunizations andmedications were updated in the EMR today and reviewed under the history and medication portions of their EMR.     ROS: Negative, with the exception of above mentioned in HPI   Objective:  BP 128/76 (BP Location: Right Arm, Patient Position: Sitting, Cuff Size: Large)   Pulse 81   Temp 98 F (36.7 C) (Temporal)   Resp 16   Ht 6\' 1"  (1.854 m)   Wt 240 lb 8 oz (109.1 kg)   SpO2 99%   BMI 31.73 kg/m  Body mass index  is 31.73 kg/m. Gen: Afebrile. No acute distress. Nontoxic in appearance, well developed, well nourished.  HENT: AT. [place].  Eyes:Pupils Equal Round Reactive to light, Extraocular movements intact,  Conjunctiva without redness, discharge or icterus. CV: RRR  Chest: CTAB, no wheeze or crackles. Good air movement, normal resp effort.  Abd: Soft. NTND. BS present. no Masses palpated. No rebound or guarding. MSK: no CVA tenderness bilaterally  Neuro:  Normal gait. PERLA. EOMi. Alert. Oriented x3  Psych: Normal affect, dress and demeanor. Normal speech. Normal thought content and judgment.  No exam data present No results found. Results for orders placed or performed in visit on 04/18/19 (from the past 24 hour(s))  POCT Urinalysis Dipstick (Automated)     Status: Abnormal   Collection Time: 04/18/19  9:45 AM  Result Value Ref Range   Color, UA yellow    Clarity, UA cloudy    Glucose, UA Negative Negative   Bilirubin, UA negative    Ketones, UA negative    Spec Grav, UA 1.020 1.010 - 1.025   Blood, UA negative    pH, UA 6.0 5.0 - 8.0   Protein, UA Negative Negative   Urobilinogen, UA 0.2 0.2 or 1.0 E.U./dL   Nitrite, UA positive    Leukocytes, UA 4+ (A) Negative    Assessment/Plan: BRAXXTON GANGE is a 63 y.o. male present for OV for  Dysuria/Abnormal urine - recurrent UTI- prior tx augmentin. Prior micro: E.Coli- pansensitive (except amp) - POCT Urinalysis Dipstick (Automated)> +4 and nitrates - Urine Culture ordered - consider macrobid QD low dose if recurs.  - start bactrim DS x 7 days - f/u PRN  Flu shot administered today    Reviewed expectations re: course of current medical issues.  Discussed self-management of symptoms.  Outlined signs and symptoms indicating need for more acute intervention.  Patient verbalized understanding and all questions were answered.  Patient received an After-Visit Summary.    Orders Placed This Encounter  Procedures  . Flu Vaccine  QUAD 36+ mos IM  . POCT Urinalysis Dipstick (Automated)    > 25 minutes spent with patient, >50% of time spent face to face    Note is dictated utilizing voice recognition software. Although note has been proof read prior to signing, occasional typographical errors still can be missed. If any questions arise, please do not hesitate to call for verification.   electronically signed  by:  Howard Pouch, DO  [place]

## 2019-04-18 NOTE — Telephone Encounter (Signed)
Patient can be seen at 9:45am, if not he can be scheduled with another provider or go to an urgent care

## 2019-04-18 NOTE — Patient Instructions (Signed)
Start bactrim 1 tab every 12 hours for 7 days. Hydrate. Flush kidneys/bladder with plenty of water .   We will call you with culture results.

## 2019-04-18 NOTE — Telephone Encounter (Signed)
Patient is coming in today at 9:45.

## 2019-04-20 LAB — URINE CULTURE
MICRO NUMBER:: 951766
SPECIMEN QUALITY:: ADEQUATE

## 2019-05-07 ENCOUNTER — Other Ambulatory Visit: Payer: Self-pay

## 2019-05-07 ENCOUNTER — Ambulatory Visit (INDEPENDENT_AMBULATORY_CARE_PROVIDER_SITE_OTHER): Payer: BC Managed Care – PPO | Admitting: Family Medicine

## 2019-05-07 ENCOUNTER — Encounter: Payer: Self-pay | Admitting: Family Medicine

## 2019-05-07 VITALS — BP 140/82 | HR 75 | Temp 97.6°F | Resp 18 | Ht 74.0 in | Wt 243.1 lb

## 2019-05-07 DIAGNOSIS — R300 Dysuria: Secondary | ICD-10-CM

## 2019-05-07 DIAGNOSIS — N1831 Chronic kidney disease, stage 3a: Secondary | ICD-10-CM | POA: Diagnosis not present

## 2019-05-07 DIAGNOSIS — Z789 Other specified health status: Secondary | ICD-10-CM

## 2019-05-07 DIAGNOSIS — I10 Essential (primary) hypertension: Secondary | ICD-10-CM

## 2019-05-07 DIAGNOSIS — E669 Obesity, unspecified: Secondary | ICD-10-CM | POA: Diagnosis not present

## 2019-05-07 DIAGNOSIS — Z0000 Encounter for general adult medical examination without abnormal findings: Secondary | ICD-10-CM | POA: Diagnosis not present

## 2019-05-07 DIAGNOSIS — Z125 Encounter for screening for malignant neoplasm of prostate: Secondary | ICD-10-CM | POA: Diagnosis not present

## 2019-05-07 DIAGNOSIS — Z23 Encounter for immunization: Secondary | ICD-10-CM | POA: Diagnosis not present

## 2019-05-07 DIAGNOSIS — N321 Vesicointestinal fistula: Secondary | ICD-10-CM

## 2019-05-07 DIAGNOSIS — R7303 Prediabetes: Secondary | ICD-10-CM | POA: Diagnosis not present

## 2019-05-07 DIAGNOSIS — N390 Urinary tract infection, site not specified: Secondary | ICD-10-CM | POA: Diagnosis not present

## 2019-05-07 DIAGNOSIS — E785 Hyperlipidemia, unspecified: Secondary | ICD-10-CM

## 2019-05-07 MED ORDER — SULFAMETHOXAZOLE-TRIMETHOPRIM 800-160 MG PO TABS: 1.0000 | ORAL_TABLET | Freq: Two times a day (BID) | ORAL | 0 refills | Status: DC

## 2019-05-07 MED ORDER — LOSARTAN POTASSIUM-HCTZ 50-12.5 MG PO TABS: 1.0000 | ORAL_TABLET | Freq: Every day | ORAL | 1 refills | Status: DC

## 2019-05-07 NOTE — Progress Notes (Signed)
Patient ID: Javier Casey, male  DOB: 04-23-1956, 63 y.o.   MRN: [id number] Patient Care Team    Relationship Specialty Notifications Start End  Ma Hillock, DO PCP - General Family Medicine  08/13/15   Pyrtle, Lajuan Lines, MD Consulting Physician Gastroenterology  02/18/16     Chief Complaint  Patient presents with  . Annual Exam    Fasting.     Subjective:  Javier Casey is a 63 y.o. male present for CPE. All past medical history, surgical history, allergies, family history, immunizations, medications and social history were updated in the electronic medical record today. All recent labs, ED visits and hospitalizations within the last year were reviewed  Colovesical fistula/Recurrent UTI Patient continues to have recurrent UTIs despite adequate treatment.  Initially with Augmentin, which was switched to Bactrim when he presented with infection 20 days ago.  He states the symptoms do completely resolve with the use of the antibiotics but then return within 1 to 2 weeks after discontinuation of antibiotics.  He has had for urinary tract infections since May.  Since that time he has also had a partial colectomy and is status post colostomy and reanastomosis, secondary to colovesicular fistula/diverticulitis.  He was recently treated on 04/18/2019 for recurrent UTI with Bactrim double strength twice daily x7 days.  He reports approximately 4-5 days of recurrent urinary frequency and dysuria.  He denies fever, chills, nausea, vomit or flank pain.  HTN/HLD/morbid obesity/CKD3: Pt reports usually complaints with  hyzaar 50-12.5 QD, HCTZ 25 m. Blood pressures ranges at home in normal ranges. Patient denies chest pain, shortness of breath, dizziness or lower extremity edema.  He stopped the pravastatin because he experienced muscle cramps again. Intolerant to statin group.  BMP: 08/31/2017 GFR 55 CBC:  03/08/2018 TSH:08/31/2017 WNL Lipids: 08/31/2017 total217, H31, L151, tg 174 Diet: He watches  the salt content Exercise: he tries to walk RF: HLD  (collected 02/2016). Obesity. FHX- CAD/CABG  Prediabetes: last a1c 5.6 today.   Health maintenance:  Colonoscopy: 2020, polyps and diverticulosis.  Post partial colectomy/colostomy/reanastomosis-  Dr. Hilarie Fredrickson. No fhx. Immunizations: tdap UTD 2017, influenza UTD 2020 (encouraged yearly), prevnar 2015. shingrix #1 today.  Infectious disease screening: HIV and  Hep C completed PSA: No fhx.  PSA:  Lab Results  Component Value Date   PSA 1.22 02/18/2016   PSA 0.83 02/26/2008  , pt was counseled on prostate cancer screenings.  Assistive device: none Oxygen [id number] Patient has a Dental home. Hospitalizations/ED visits: reviewed  Depression screen [facility] 2/9 05/07/2019 03/01/2018 02/23/2017 02/18/2016  Decreased Interest 0 0 0 0  Down, Depressed, Hopeless 0 0 0 0  PHQ - 2 Score 0 0 0 0   No flowsheet data found.     Fall Risk  03/01/2018 02/23/2017 02/18/2016  Falls in the past year? No No No   Immunization History  Administered Date(s) Administered  . Influenza,inj,Quad PF,6+ Mos 04/30/2013, 04/13/2014, 04/18/2019  . Pneumococcal Conjugate-13 04/13/2014  . Td 02/14/2006  . Tdap 01/31/2016   Past Medical History:  Diagnosis Date  . Allergy    swelling lips ,nose, hands and feet, tongue, but unknown reason  . Asthma   . Colon polyps   . Diverticulitis   . Diverticulosis 2016   There was severe diverticulosis noted in the ascending to sigmoid colon  . Epididymal cyst 02/2016   Multiple on scrotal u/s  . GERD (gastroesophageal reflux disease)   . Hyperlipidemia   . Hypertension   .  Hyperuricemia   . Tubular adenoma of colon 2009; 2016   Allergies  Allergen Reactions  . Ciprofloxacin     Severe swelling and rash  . Levofloxacin     Diffuse swelling with angioedema  . Pravastatin Other (See Comments)    myalgia  . Lipitor [Atorvastatin] Other (See Comments)    Myalgia    Past Surgical History:  Procedure Laterality  Date  . APPENDECTOMY    . colonoscopy with polypectomy  2009; 12/01/2014   [facility].  +Diverticulosis  . WISDOM TOOTH EXTRACTION     Family History  Problem Relation Age of Onset  . Coronary artery disease Mother 7       died post CBAG  . Allergic Disorder Daughter   . Allergic Disorder Son   . Cancer Neg Hx   . Stroke Neg Hx   . Diabetes Neg Hx   . Colon cancer Neg Hx   . Colon polyps Neg Hx   . Esophageal cancer Neg Hx   . Stomach cancer Neg Hx   . Pancreatic cancer Neg Hx   . Liver disease Neg Hx   . Inflammatory bowel disease Neg Hx    Social History   Social History Narrative  . Not on file    Allergies as of 05/07/2019      Reactions   Ciprofloxacin    Severe swelling and rash   Levofloxacin    Diffuse swelling with angioedema   Pravastatin Other (See Comments)   myalgia   Lipitor [atorvastatin] Other (See Comments)   Myalgia       Medication List       Accurate as of May 07, 2019  2:18 PM. If you have any questions, ask your nurse or doctor.        STOP taking these medications   sulfamethoxazole-trimethoprim 800-160 MG tablet Commonly known as: BACTRIM DS Stopped by: Javier Pouch, DO   triamcinolone cream 0.1 % Commonly known as: KENALOG Stopped by: Javier Pouch, DO     TAKE these medications   B-12 2500 MCG Tabs Take by mouth daily.   losartan-hydrochlorothiazide 50-12.5 MG tablet Commonly known as: HYZAAR Take 1 tablet by mouth daily.   OMEGA-3 2100 PO Take by mouth.   Vitamin D3 250 MCG (10000 UT) capsule Take 10,000 Units by mouth daily.      All past medical history, surgical history, allergies, family history, immunizations andmedications were updated in the EMR today and reviewed under the history and medication portions of their EMR.     Recent Results (from the past 2160 hour(s))  POCT Urinalysis Dipstick (Automated)     Status: Abnormal   Collection Time: 04/18/19  9:45 AM  Result Value Ref Range   Color, UA  yellow    Clarity, UA cloudy    Glucose, UA Negative Negative   Bilirubin, UA negative    Ketones, UA negative    Spec Grav, UA 1.020 1.010 - 1.025   Blood, UA negative    pH, UA 6.0 5.0 - 8.0   Protein, UA Negative Negative   Urobilinogen, UA 0.2 0.2 or 1.0 E.U./dL   Nitrite, UA positive    Leukocytes, UA 4+ (A) Negative  Urine Culture     Status: Abnormal   Collection Time: 04/18/19 10:07 AM   Specimen: Urine  Result Value Ref Range   MICRO NUMBER: [id number]    SPECIMEN QUALITY: Adequate    Sample Source URINE    STATUS: FINAL    ISOLATE  1: Escherichia coli (A)     Comment: Greater than 100,000 CFU/mL of Escherichia coli      Susceptibility   Escherichia coli - URINE CULTURE, REFLEX    AMOX/CLAVULANIC 16 Intermediate     AMPICILLIN >=32 Resistant     AMPICILLIN/SULBACTAM >=32 Resistant     CEFAZOLIN* <=4 Not Reportable      * For infections other than uncomplicated UTIcaused by E. coli, K. pneumoniae or P. mirabilis:Cefazolin is resistant if MIC > or = 8 mcg/mL.(Distinguishing susceptible versus intermediatefor isolates with MIC < or = 4 mcg/mL requiresadditional testing.)For uncomplicated UTI caused by E. coli,K. pneumoniae or P. mirabilis: Cefazolin issusceptible if MIC <32 mcg/mL and predictssusceptible to the oral agents cefaclor, cefdinir,cefpodoxime, cefprozil, cefuroxime, cephalexinand loracarbef.    CEFEPIME <=1 Sensitive     CEFTRIAXONE <=1 Sensitive     CIPROFLOXACIN <=0.25 Sensitive     LEVOFLOXACIN <=0.12 Sensitive     ERTAPENEM <=0.5 Sensitive     GENTAMICIN <=1 Sensitive     IMIPENEM <=0.25 Sensitive     NITROFURANTOIN <=16 Sensitive     PIP/TAZO <=4 Sensitive     TOBRAMYCIN <=1 Sensitive     TRIMETH/SULFA* <=20 Sensitive      * For infections other than uncomplicated UTIcaused by E. coli, K. pneumoniae or P. mirabilis:Cefazolin is resistant if MIC > or = 8 mcg/mL.(Distinguishing susceptible versus intermediatefor isolates with MIC < or = 4 mcg/mL  requiresadditional testing.)For uncomplicated UTI caused by E. coli,K. pneumoniae or P. mirabilis: Cefazolin issusceptible if MIC <32 mcg/mL and predictssusceptible to the oral agents cefaclor, cefdinir,cefpodoxime, cefprozil, cefuroxime, cephalexinand loracarbef.Legend:S = Susceptible  I = IntermediateR = Resistant  NS = Not susceptible* = Not tested  NR = Not reported**NN = See antimicrobic comments    ROS: 14 pt review of systems performed and negative (unless mentioned in an HPI)  Objective: BP 140/82 (BP Location: Left Arm, Patient Position: Sitting, Cuff Size: Normal)   Pulse 75   Temp 97.6 F (36.4 C) (Temporal)   Resp 18   Ht 6\' 2"  (1.88 m)   Wt 243 lb 2 oz (110.3 kg)   SpO2 98%   BMI 31.22 kg/m  Gen: Afebrile. No acute distress. Nontoxic in appearance, well-developed, well-nourished, pleasant, obese Caucasian male HENT: AT. [place]. Bilateral TM visualized and normal in appearance, normal external auditory canal. MMM, no oral lesions, adequate dentition. Bilateral nares within normal limits. Throat without erythema, ulcerations or exudates.  No cough on exam, no hoarseness on exam. Eyes:Pupils Equal Round Reactive to light, Extraocular movements intact,  Conjunctiva without redness, discharge or icterus. Neck/lymp/endocrine: Supple, no lymphadenopathy, no thyromegaly CV: RRR no murmur, no edema, +2/4 P posterior tibialis pulses.  No carotid bruits. No JVD. Chest: CTAB, no wheeze, rhonchi or crackles.  Normal respiratory effort.  Good air movement. Abd: Soft.  Flat.  Midline scar well-healed. mild tenderness left suprapubic.  ND. BS present.  No masses palpated. No hepatosplenomegaly. No rebound tenderness or guarding. Skin: No rashes, purpura or petechiae. Warm and well-perfused. Skin intact. Neuro/Msk:  Normal gait. PERLA. EOMi. Alert. Oriented x3.  Cranial nerves II through XII intact. Muscle strength 5/5 upper/lower extremity. DTRs equal bilaterally. Psych: Normal affect, dress and  demeanor. Normal speech. Normal thought content and judgment.  No exam data present  Assessment/plan: JEX ARKIN is a 63 y.o. male present for CPE Hyperlipidemia/hypertension/CKD3/6 statin intolerant/obesity - Has been stable, home pressures are reported normal.  He will continue to monitor and call back if blood pressures above  135/85 routinely.  If so we will need to increase his regimen.  He reports a blood pressure of 0000000 systolic before leaving the house today. - could not tolerate pravastatin. Statin intolerant.  - home BP normal- continue HCTZ 25 mg QD, Hyzaar 50-12.5 - Low salt diet. Increase exercise to > 150 minutes a week.  - F/U 3 months if BP controlled on current dose with repeat lipids, once stable can be seen q 6 months.  Prediabetes:  - a1c 6.0--> 5.6 >> collected today Prostate cancer screening - PSA Need for shingles vaccine - Varicella-zoster vaccine IM  Colovesical fistula/Recurrent UTI/dysuria -Patient continues to have recurrent UTIs despite adequate treatment.  Initially with Augmentin, which was switched to Bactrim when he presented with infection 20 days ago.  He states the symptoms do completely resolve with the use of the antibiotics but then return within 1 to 2 weeks after discontinuation of antibiotics.  Suspect colonization from colovesicular fistula versus continued contamination right colon. - CBC - Urinalysis w microscopic + reflex culture Start treatment again with Bactrim DS BID and send off for urine studies/cultures.   -If culture is sensitive to Bactrim will extend course to a total of 14 days and then provide a low-dose Bactrim half a tab single strength daily for prophylaxis while referring to urology for further evaluation.  Encounter for preventive health examination Patient was encouraged to exercise greater than 150 minutes a week. Patient was encouraged to choose a diet filled with fresh fruits and vegetables, and lean meats. AVS  provided to patient today for education/recommendation on gender specific health and safety maintenance. Colonoscopy: Colonoscopy: 2020, polyps and diverticulosis.  Post partial colectomy/colostomy/reanastomosis-  Dr. Hilarie Fredrickson. No fhx.  Immunizations: tdap UTD 2017, influenza UTD 2020 (encouraged yearly), prevnar 2015. shingrix #1 today.  Infectious disease screening: HIV and  Hep C completed PSA: No fhx.   Return in about 1 year (around 05/06/2020) for CPE (30 min).  In 6 months for chronic medical conditions.  Annual physical completed today as well as > 15 minutes spent with patient, > 50% of that time face to face evaluating/treating chronic conditions and acute complaint.   Orders Placed This Encounter  Procedures  . Urine Culture  . Varicella-zoster vaccine IM  . CBC  . Comprehensive metabolic panel  . Hemoglobin A1c  . Lipid panel  . TSH  . Urinalysis w microscopic + reflex cultur  . PSA  . REFLEXIVE URINE CULTURE    Note is dictated utilizing voice recognition software. Although note has been proof read prior to signing, occasional typographical errors still can be missed. If any questions arise, please do not hesitate to call for verification.  Electronically signed by: Javier Pouch, DO [place]

## 2019-05-07 NOTE — Patient Instructions (Signed)

## 2019-05-08 ENCOUNTER — Encounter: Payer: Self-pay | Admitting: Family Medicine

## 2019-05-08 DIAGNOSIS — Z789 Other specified health status: Secondary | ICD-10-CM | POA: Insufficient documentation

## 2019-05-08 LAB — COMPREHENSIVE METABOLIC PANEL
ALT: 10 U/L (ref 0–53)
AST: 12 U/L (ref 0–37)
Albumin: 4.4 g/dL (ref 3.5–5.2)
Alkaline Phosphatase: 42 U/L (ref 39–117)
BUN: 19 mg/dL (ref 6–23)
CO2: 28 mEq/L (ref 19–32)
Calcium: 9.9 mg/dL (ref 8.4–10.5)
Chloride: 103 mEq/L (ref 96–112)
Creatinine, Ser: 1.44 mg/dL (ref 0.40–1.50)
GFR: 49.44 mL/min — ABNORMAL LOW (ref >60.00)
Glucose, Bld: 85 mg/dL (ref 70–99)
Potassium: 4 mEq/L (ref 3.5–5.1)
Sodium: 140 mEq/L (ref 135–145)
Total Bilirubin: 0.6 mg/dL (ref 0.2–1.2)
Total Protein: 6.9 g/dL (ref 6.0–8.3)

## 2019-05-08 LAB — LIPID PANEL
Cholesterol: 317 mg/dL — ABNORMAL HIGH (ref 0–200)
HDL: 45.7 mg/dL (ref >39.00)
LDL Cholesterol: 243 mg/dL — ABNORMAL HIGH (ref 0–99)
NonHDL: 271.09
Total CHOL/HDL Ratio: 7
Triglycerides: 141 mg/dL (ref 0.0–149.0)
VLDL: 28.2 mg/dL (ref 0.0–40.0)

## 2019-05-08 LAB — PSA: PSA: 2.08 ng/mL (ref 0.10–4.00)

## 2019-05-08 LAB — CBC
HCT: 43 % (ref 39.0–52.0)
Hemoglobin: 14.3 g/dL (ref 13.0–17.0)
MCHC: 33.1 g/dL (ref 30.0–36.0)
MCV: 86 fl (ref 78.0–100.0)
Platelets: 186 10*3/uL (ref 150.0–400.0)
RBC: 5 Mil/uL (ref 4.22–5.81)
RDW: 16.5 % — ABNORMAL HIGH (ref 11.5–15.5)
WBC: 5.6 10*3/uL (ref 4.0–10.5)

## 2019-05-08 LAB — HEMOGLOBIN A1C: Hgb A1c MFr Bld: 5.8 % (ref 4.6–6.5)

## 2019-05-08 LAB — TSH: TSH: 2.04 u[IU]/mL (ref 0.35–4.50)

## 2019-05-10 LAB — URINALYSIS W MICROSCOPIC + REFLEX CULTURE
Bilirubin Urine: NEGATIVE
Glucose, UA: NEGATIVE
Hgb urine dipstick: NEGATIVE
Hyaline Cast: NONE SEEN /LPF
Ketones, ur: NEGATIVE
Nitrites, Initial: NEGATIVE
Protein, ur: NEGATIVE
RBC / HPF: NONE SEEN /HPF (ref 0–2)
Specific Gravity, Urine: 1.018 (ref 1.001–1.03)
Squamous Epithelial / HPF: NONE SEEN /HPF (ref <=5)
pH: 6 (ref 5.0–8.0)

## 2019-05-10 LAB — URINE CULTURE
MICRO NUMBER:: 1015846
SPECIMEN QUALITY:: ADEQUATE

## 2019-05-10 LAB — CULTURE INDICATED

## 2019-05-12 ENCOUNTER — Telehealth: Payer: Self-pay | Admitting: Family Medicine

## 2019-05-12 DIAGNOSIS — N390 Urinary tract infection, site not specified: Secondary | ICD-10-CM

## 2019-05-12 DIAGNOSIS — N321 Vesicointestinal fistula: Secondary | ICD-10-CM

## 2019-05-12 MED ORDER — SULFAMETHOXAZOLE-TRIMETHOPRIM 800-160 MG PO TABS: 1.0000 | ORAL_TABLET | Freq: Two times a day (BID) | ORAL | 0 refills | Status: DC

## 2019-05-12 MED ORDER — EZETIMIBE 10 MG PO TABS: 10.0000 mg | ORAL_TABLET | Freq: Every day | ORAL | 3 refills | Status: DC

## 2019-05-12 MED ORDER — SULFAMETHOXAZOLE-TRIMETHOPRIM 400-80 MG PO TABS: 0.5000 | ORAL_TABLET | Freq: Two times a day (BID) | ORAL | 0 refills | Status: DC

## 2019-05-12 NOTE — Telephone Encounter (Signed)
Pt was called and given results/information. He does not want to see cardiologist at this time, will continue to work on diet and exercise and will start Zetia. Pt advised medications were sent to the pharmacy.

## 2019-05-12 NOTE — Telephone Encounter (Signed)
Please inform patient the following information: His urine culture was positive, again for E. coli.  Recommend we extend his treatment to a total of 14 days with the Bactrim double strength twice daily he currently has.  I have called in an extra 7 days of this strength of medication.  I also then recommend after the 14-day treatment he start the nightly Bactrim (1/2 tab) and lower dose also called in until he speaks with his urologist, which I placed referral for him.  In addition: His diabetes screen/A1c increased to 5.8-again this is right on the lines of the prediabetes.  Increasing diet and exercise will be helpful.   His thyroid is functioning normal. Prostate cancer screening is normal. His blood counts are normal. His kidney function is stable from prior collection. His cholesterol is extremely high in comparison to prior.  It is still high, I had the lab run it again to make sure there was no error.  His total cholesterol is 317 and his bad cholesterol is 243.  Considering he has been exercising and changed his diet this is not as expected.  I would recommend a mediterranean diet and regular exercise.  A mediterranean diet is high in fruits, vegetables, whole grains, fish, chicken, nuts, healthy fats (olive oil or canola oil). Low fat dairy.  The concern is with a bad cholesterol of that level he is at an increased risk of heart attack/stroke.  Since he is intolerant to any of the statin medications which help control this and provide him cardiovascular protection our choices are limited on medications.  Zetia is a medication that can be helpful, it is not a statin.  However, I doubt it will bring his cholesterol down to the level that is needed.  I still would recommend we start this, and he watches his diet closely using the Mediterranean diet and follow-up in 8 weeks.  If cholesterol is still extremely elevated at that time would consider referral to the lipid clinic/cardiology to discuss  other options for him or if he desires we can refer him now to cardio.

## 2019-06-18 DIAGNOSIS — N3020 Other chronic cystitis without hematuria: Secondary | ICD-10-CM | POA: Diagnosis not present

## 2019-07-04 DIAGNOSIS — N3020 Other chronic cystitis without hematuria: Secondary | ICD-10-CM | POA: Diagnosis not present

## 2019-07-09 DIAGNOSIS — N200 Calculus of kidney: Secondary | ICD-10-CM | POA: Diagnosis not present

## 2019-07-09 DIAGNOSIS — N3020 Other chronic cystitis without hematuria: Secondary | ICD-10-CM | POA: Diagnosis not present

## 2019-08-02 DIAGNOSIS — Z9049 Acquired absence of other specified parts of digestive tract: Secondary | ICD-10-CM | POA: Diagnosis not present

## 2019-08-02 DIAGNOSIS — Z87440 Personal history of urinary (tract) infections: Secondary | ICD-10-CM | POA: Diagnosis not present

## 2019-08-02 DIAGNOSIS — I10 Essential (primary) hypertension: Secondary | ICD-10-CM | POA: Diagnosis not present

## 2019-08-02 DIAGNOSIS — W298XXA Contact with other powered powered hand tools and household machinery, initial encounter: Secondary | ICD-10-CM | POA: Diagnosis not present

## 2019-08-02 DIAGNOSIS — M79642 Pain in left hand: Secondary | ICD-10-CM | POA: Diagnosis not present

## 2019-08-02 DIAGNOSIS — S61412A Laceration without foreign body of left hand, initial encounter: Secondary | ICD-10-CM | POA: Diagnosis not present

## 2019-08-02 DIAGNOSIS — M7989 Other specified soft tissue disorders: Secondary | ICD-10-CM | POA: Diagnosis not present

## 2019-08-02 DIAGNOSIS — K5792 Diverticulitis of intestine, part unspecified, without perforation or abscess without bleeding: Secondary | ICD-10-CM | POA: Diagnosis not present

## 2019-08-02 DIAGNOSIS — Y939 Activity, unspecified: Secondary | ICD-10-CM | POA: Diagnosis not present

## 2019-08-02 DIAGNOSIS — Y929 Unspecified place or not applicable: Secondary | ICD-10-CM | POA: Diagnosis not present

## 2019-08-15 ENCOUNTER — Other Ambulatory Visit: Payer: Self-pay

## 2019-08-15 ENCOUNTER — Ambulatory Visit (INDEPENDENT_AMBULATORY_CARE_PROVIDER_SITE_OTHER): Payer: BC Managed Care – PPO | Admitting: Family Medicine

## 2019-08-15 ENCOUNTER — Encounter: Payer: Self-pay | Admitting: Family Medicine

## 2019-08-15 VITALS — BP 142/78 | HR 73 | Temp 97.5°F | Resp 16 | Ht 74.0 in | Wt 239.0 lb

## 2019-08-15 DIAGNOSIS — T148XXA Other injury of unspecified body region, initial encounter: Secondary | ICD-10-CM

## 2019-08-15 DIAGNOSIS — S6992XA Unspecified injury of left wrist, hand and finger(s), initial encounter: Secondary | ICD-10-CM

## 2019-08-15 DIAGNOSIS — Z5189 Encounter for other specified aftercare: Secondary | ICD-10-CM

## 2019-08-15 NOTE — Progress Notes (Signed)
This visit occurred during the SARS-CoV-2 public health emergency.  Safety protocols were in place, including screening questions prior to the visit, additional usage of staff PPE, and extensive cleaning of exam room while observing appropriate contact time as indicated for disinfecting solutions.    Javier Casey , 15-Aug-1955, 64 y.o., male MRN: [id number] Patient Care Team    Relationship Specialty Notifications Start End  Ma Hillock, DO PCP - General Family Medicine  08/13/15   Pyrtle, Lajuan Lines, MD Consulting Physician Gastroenterology  02/18/16     Chief Complaint  Patient presents with  . Suture / Staple Removal    Pt is needing stitches removed      Subjective: Pt presents for an OV to have sutures removed after sustaining a laceration/trauma to his left hand on August 04, 2019.  Patient presented to outside emergency room and 6 cm laceration was repaired with interrupted sutures.  He also was provided with a tetanus vaccination.  Patient reports overall he is improving greatly.  He reports the swelling in his palm was rather significant and has gone down a great deal, but is still swollen.  He denies any redness.  He states there is numbness throughout his hand and fingers.  It has started to improve over his fourth and fifth digits. He reports he burned his index finger with steam because he could not feel the heat coming off of a boiling pot.  Depression screen [facility] 2/9 05/07/2019 03/01/2018 02/23/2017 02/18/2016  Decreased Interest 0 0 0 0  Down, Depressed, Hopeless 0 0 0 0  PHQ - 2 Score 0 0 0 0    Allergies  Allergen Reactions  . Ciprofloxacin     Severe swelling and rash  . Levofloxacin     Diffuse swelling with angioedema  . Pravastatin Other (See Comments)    myalgia  . Lipitor [Atorvastatin] Other (See Comments)    Myalgia    Social History   Social History Narrative  . Not on file   Past Medical History:  Diagnosis Date  . Allergy    swelling lips  ,nose, hands and feet, tongue, but unknown reason  . Asthma   . Colon polyps   . 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 Diverticulitis   . Diverticulosis 2016   There was severe diverticulosis noted in the ascending to sigmoid colon  . Epididymal cyst 02/2016   Multiple on scrotal u/s  . GERD (gastroesophageal reflux disease)   . Hyperlipidemia   . Hypertension   . Hyperuricemia   . Tubular adenoma of colon 2009; 2016   Past Surgical History:  Procedure Laterality Date  . APPENDECTOMY    . colonoscopy with polypectomy  2009; 12/01/2014   [facility].  +Diverticulosis  . WISDOM TOOTH EXTRACTION     Family History  Problem Relation Age of Onset  . Coronary artery disease Mother 43       died post CBAG  . Allergic Disorder Daughter   . Allergic Disorder Son   . Cancer Neg Hx   . Stroke Neg Hx   . Diabetes Neg Hx   . Colon cancer Neg Hx   . Colon polyps Neg Hx   . Esophageal cancer Neg Hx   . Stomach cancer Neg Hx   . Pancreatic cancer Neg Hx   . Liver disease Neg Hx   . Inflammatory bowel disease Neg Hx    Allergies as of 08/15/2019      Reactions  Ciprofloxacin    Severe swelling and rash   Levofloxacin    Diffuse swelling with angioedema   Pravastatin Other (See Comments)   myalgia   Lipitor [atorvastatin] Other (See Comments)   Myalgia       Medication List       Accurate as of August 15, 2019  4:40 PM. If you have any questions, ask your nurse or doctor.        B-12 2500 MCG Tabs Take by mouth daily.   ezetimibe 10 MG tablet Commonly known as: Zetia Take 1 tablet (10 mg total) by mouth daily.   losartan-hydrochlorothiazide 50-12.5 MG tablet Commonly known as: HYZAAR Take 1 tablet by mouth daily.   OMEGA-3 2100 PO Take by mouth.   sulfamethoxazole-trimethoprim 400-80 MG tablet Commonly known as: Bactrim Take 0.5 tablets by mouth 2 (two) times daily. What changed: Another medication with the same name was removed. Continue taking this  medication, and follow the directions you see here. Changed by: Howard Pouch, DO   Vitamin D3 250 MCG (10000 UT) capsule Take 10,000 Units by mouth daily.       All past medical history, surgical history, allergies, family history, immunizations andmedications were updated in the EMR today and reviewed under the history and medication portions of their EMR.     ROS: Negative, with the exception of above mentioned in HPI   Objective:  BP (!) 142/78 (BP Location: Left Arm, Patient Position: Sitting, Cuff Size: Normal)   Pulse 73   Temp (!) 97.5 F (36.4 C) (Temporal)   Resp 16   Ht 6\' 2"  (1.88 m)   Wt 239 lb (108.4 kg)   SpO2 98%   BMI 30.69 kg/m  Body mass index is 30.69 kg/m. Gen: Afebrile. No acute distress. Nontoxic in appearance, well developed, well nourished.  HENT: AT. [place].  Skin: Proximately 6 cm linear laceration left dorsal aspect of hand running along index to thumb.  No erythema.  No bleeding.  No drainage.  Times 9 interrupted sutures present with incomplete approximation of wound edges.  Mild-moderate swelling remains in the center of his palm.  Light bruising present at this location.  Index finger with mild swelling, redness and obvious prior blister formation posterior nailbed.  Decreased sensation of thumb, index, third and fourth fingers.  Good capillary refill x5 fingers. Neuro: Alert. Oriented x3   No exam data present No results found. No results found for this or any previous visit (from the past 24 hour(s)).  Assessment/Plan: Javier Casey is a 64 y.o. male present for OV for  Hand trauma, left, initial encounter/Hematoma/wound check -Laceration/sutures was soaked in saline gauze and cleansed with Hibiclens. -Every other suture was removed today.  Approximation of wound is present up to the epidermis layer.  Epidermis is not well approximated at this time.  Steri-Strip application was placed. Patient tolerated well. Patient will return Tuesday for  removal of remaining sutures. He was encouraged to use Silvadene (OTC) over his index finger monitor for any signs of redness or infection. Patient was encouraged to use heat application for 15 minutes at a time a few times a day over palm/hematoma area.  He was counseled on safety, never apply heat directly to skin.  In caution with his decrease sensation and heat application.  He reports understanding. We will refer to hand therapy.  Encouraged him that should be started only after all sutures are removed.  He reports understanding.   Reviewed expectations re: course of  current medical issues.  Discussed self-management of symptoms.  Outlined signs and symptoms indicating need for more acute intervention.  Patient verbalized understanding and all questions were answered.  Patient received an After-Visit Summary.    Orders Placed This Encounter  Procedures  . Ambulatory referral to Physical Therapy  . Ambulatory referral to Occupational Therapy  No orders of the defined types were placed in this encounter.  Referral Orders     Ambulatory referral to Physical Therapy     Ambulatory referral to Occupational Therapy- hand therapy   Note is dictated utilizing voice recognition software. Although note has been proof read prior to signing, occasional typographical errors still can be missed. If any questions arise, please do not hesitate to call for verification.   electronically signed by:  Howard Pouch, DO  [place]

## 2019-08-15 NOTE — Patient Instructions (Signed)
Follow up on Tuesday for removal  of remaining stiches.  Use silvadene on your burn.   We will refer you to hand therapy/occupational therapy- this should start AFTER we get the all the stitches out.

## 2019-08-17 ENCOUNTER — Encounter: Payer: Self-pay | Admitting: Family Medicine

## 2019-08-18 MED ORDER — SILVER SULFADIAZINE 1 % EX CREA: 1.0000 "application " | TOPICAL_CREAM | Freq: Every day | CUTANEOUS | 0 refills | Status: DC

## 2019-08-18 NOTE — Telephone Encounter (Signed)
Silvadene prescribed for him.  Similar product is OTC.  Would not use Voltaren over burn or wound- but can use on hand/palm discomfort.

## 2019-08-18 NOTE — Telephone Encounter (Signed)
Please advise 

## 2019-08-18 NOTE — Telephone Encounter (Signed)
Pt was called and given information, he verbalized understanding  

## 2019-08-19 ENCOUNTER — Other Ambulatory Visit: Payer: Self-pay

## 2019-08-19 ENCOUNTER — Encounter: Payer: Self-pay | Admitting: Family Medicine

## 2019-08-19 ENCOUNTER — Ambulatory Visit (INDEPENDENT_AMBULATORY_CARE_PROVIDER_SITE_OTHER): Payer: BC Managed Care – PPO | Admitting: Family Medicine

## 2019-08-19 VITALS — BP 109/73 | HR 83 | Temp 97.4°F | Resp 17 | Ht 74.0 in | Wt 240.2 lb

## 2019-08-19 DIAGNOSIS — T148XXA Other injury of unspecified body region, initial encounter: Secondary | ICD-10-CM | POA: Diagnosis not present

## 2019-08-19 DIAGNOSIS — Z5189 Encounter for other specified aftercare: Secondary | ICD-10-CM | POA: Diagnosis not present

## 2019-08-19 DIAGNOSIS — S6992XD Unspecified injury of left wrist, hand and finger(s), subsequent encounter: Secondary | ICD-10-CM

## 2019-08-19 NOTE — Progress Notes (Signed)
This visit occurred during the SARS-CoV-2 public health emergency.  Safety protocols were in place, including screening questions prior to the visit, additional usage of staff PPE, and extensive cleaning of exam room while observing appropriate contact time as indicated for disinfecting solutions.    Javier Casey , 08-22-1955, 64 y.o., male MRN: [id number] Patient Care Team    Relationship Specialty Notifications Start End  Ma Hillock, DO PCP - General Family Medicine  08/13/15   Pyrtle, Lajuan Lines, MD Consulting Physician Gastroenterology  02/18/16     Chief Complaint  Patient presents with  . Follow-up    Stitch removal   . Suture / Staple Removal     Subjective: Pt presents for an OV to have sutures removed after sustaining a laceration/trauma to his left hand on August 04, 2019. He reports it is improving. The swelling has improved- still present. He has numbness of the hand. He is using the silvadene on the separate burn of index finger.   Prior pt:   Patient presented to outside emergency room and 6 cm laceration was repaired with interrupted sutures.  He also was provided with a tetanus vaccination.  Patient reports overall he is improving greatly.  He reports the swelling in his palm was rather significant and has gone down a great deal, but is still swollen.  He denies any redness.  He states there is numbness throughout his hand and fingers.  It has started to improve over his fourth and fifth digits. He reports he burned his index finger with steam because he could not feel the heat coming off of a boiling pot.  Depression screen [facility] 2/9 05/07/2019 03/01/2018 02/23/2017 02/18/2016  Decreased Interest 0 0 0 0  Down, Depressed, Hopeless 0 0 0 0  PHQ - 2 Score 0 0 0 0    Allergies  Allergen Reactions  . Ciprofloxacin     Severe swelling and rash  . Levofloxacin     Diffuse swelling with angioedema  . Pravastatin Other (See Comments)    myalgia  . Lipitor [Atorvastatin]  Other (See Comments)    Myalgia    Social History   Social History Narrative  . Not on file   Past Medical History:  Diagnosis Date  . Allergy    swelling lips ,nose, hands and feet, tongue, but unknown reason  . Asthma   . Colon polyps   . 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 Diverticulitis   . Diverticulosis 2016   There was severe diverticulosis noted in the ascending to sigmoid colon  . Epididymal cyst 02/2016   Multiple on scrotal u/s  . GERD (gastroesophageal reflux disease)   . Hyperlipidemia   . Hypertension   . Hyperuricemia   . Tubular adenoma of colon 2009; 2016   Past Surgical History:  Procedure Laterality Date  . APPENDECTOMY    . colonoscopy with polypectomy  2009; 12/01/2014   [facility].  +Diverticulosis  . WISDOM TOOTH EXTRACTION     Family History  Problem Relation Age of Onset  . Coronary artery disease Mother 46       died post CBAG  . Allergic Disorder Daughter   . Allergic Disorder Son   . Cancer Neg Hx   . Stroke Neg Hx   . Diabetes Neg Hx   . Colon cancer Neg Hx   . Colon polyps Neg Hx   . Esophageal cancer Neg Hx   . Stomach cancer Neg Hx   .  Pancreatic cancer Neg Hx   . Liver disease Neg Hx   . Inflammatory bowel disease Neg Hx    Allergies as of 08/19/2019      Reactions   Ciprofloxacin    Severe swelling and rash   Levofloxacin    Diffuse swelling with angioedema   Pravastatin Other (See Comments)   myalgia   Lipitor [atorvastatin] Other (See Comments)   Myalgia       Medication List       Accurate as of August 19, 2019  2:48 PM. If you have any questions, ask your nurse or doctor.        B-12 2500 MCG Tabs Take by mouth daily.   ezetimibe 10 MG tablet Commonly known as: Zetia Take 1 tablet (10 mg total) by mouth daily.   losartan-hydrochlorothiazide 50-12.5 MG tablet Commonly known as: HYZAAR Take 1 tablet by mouth daily.   OMEGA-3 2100 PO Take by mouth.   silver sulfADIAZINE 1 % cream Commonly known as:  Silvadene Apply 1 application topically daily.   sulfamethoxazole-trimethoprim 400-80 MG tablet Commonly known as: Bactrim Take 0.5 tablets by mouth 2 (two) times daily.   Vitamin D3 250 MCG (10000 UT) capsule Take 10,000 Units by mouth daily.       All past medical history, surgical history, allergies, family history, immunizations andmedications were updated in the EMR today and reviewed under the history and medication portions of their EMR.     ROS: Negative, with the exception of above mentioned in HPI   Objective:  BP 109/73 (BP Location: Left Arm, Patient Position: Sitting, Cuff Size: Normal)   Pulse 83   Temp (!) 97.4 F (36.3 C) (Temporal)   Resp 17   Ht 6\' 2"  (1.88 m)   Wt 240 lb 4 oz (109 kg)   SpO2 98%   BMI 30.85 kg/m  Body mass index is 30.85 kg/m. Gen: Afebrile. No acute distress.  HENT: AT. [place]  MSK: hand lac and remaining sutures appear better approximated today. Swelling present of palm- but improved. No bleeding, redness or drainage.  Neuro:Alert. Oriented.   No exam data present No results found. No results found for this or any previous visit (from the past 24 hour(s)).  Assessment/Plan: Javier Casey is a 64 y.o. male present for OV for  Hand trauma, left/Hematoma/wound check - Hand Improved today. Swelling improved. Wound is better approximated. Remaining sutures removed today.  - pt was encouraged to allow for steri-stips to fall off on their own- may trim back as they fray.  - pt has apt scheduled at hand therapy in [place] starting Friday. - continue silvadene over burn of index and keep covered.  - Patient was encouraged to use heat application for 15 minutes at a time a few times a day over palm/hematoma area.  He was counseled on safety, never apply heat directly to skin.  In caution with his decrease sensation and heat application.  He reports understanding.   Reviewed expectations re: course of current medical issues.  Discussed  self-management of symptoms.  Outlined signs and symptoms indicating need for more acute intervention.  Patient verbalized understanding and all questions were answered.  Patient received an After-Visit Summary.    No orders of the defined types were placed in this encounter. No orders of the defined types were placed in this encounter.  Referral Orders  No referral(s) requested today     Note is dictated utilizing voice recognition software. Although note has been proof  read prior to signing, occasional typographical errors still can be missed. If any questions arise, please do not hesitate to call for verification.   electronically signed by:  Howard Pouch, DO  [place]

## 2019-08-22 ENCOUNTER — Encounter: Payer: Self-pay | Admitting: Occupational Therapy

## 2019-08-22 ENCOUNTER — Other Ambulatory Visit: Payer: Self-pay

## 2019-08-22 ENCOUNTER — Ambulatory Visit: Payer: BC Managed Care – PPO | Attending: Family Medicine | Admitting: Occupational Therapy

## 2019-08-22 DIAGNOSIS — M79642 Pain in left hand: Secondary | ICD-10-CM

## 2019-08-22 DIAGNOSIS — R600 Localized edema: Secondary | ICD-10-CM | POA: Diagnosis not present

## 2019-08-22 DIAGNOSIS — R209 Unspecified disturbances of skin sensation: Secondary | ICD-10-CM | POA: Insufficient documentation

## 2019-08-22 DIAGNOSIS — R208 Other disturbances of skin sensation: Secondary | ICD-10-CM

## 2019-08-22 DIAGNOSIS — M25642 Stiffness of left hand, not elsewhere classified: Secondary | ICD-10-CM | POA: Diagnosis not present

## 2019-08-22 NOTE — Therapy (Signed)
[facility] [address] [place], [place], [address] Phone: [phone]   Fax:  [phone]  Occupational Therapy Evaluation  Patient Details  Name: Javier Casey MRN: [id number] Date of Birth: 1956/06/06 Referring Provider (OT): Howard Pouch   Encounter Date: 08/22/2019  OT End of Session - 08/22/19 1208    Visit Number  1    Number of Visits  12    Authorization Type  BCBS - 30 VL    OT Start Time  1102    OT Stop Time  1147    OT Time Calculation (min)  45 min    Activity Tolerance  Patient tolerated treatment well    Behavior During Therapy  [facility] for tasks assessed/performed       Past Medical History:  Diagnosis Date  . Allergy    swelling lips ,nose, hands and feet, tongue, but unknown reason  . Asthma   . Colon polyps   . 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 Diverticulitis   . Diverticulosis 2016   There was severe diverticulosis noted in the ascending to sigmoid colon  . Epididymal cyst 02/2016   Multiple on scrotal u/s  . GERD (gastroesophageal reflux disease)   . Hyperlipidemia   . Hypertension   . Hyperuricemia   . Tubular adenoma of colon 2009; 2016    Past Surgical History:  Procedure Laterality Date  . APPENDECTOMY    . colonoscopy with polypectomy  2009; 12/01/2014    [facility].  +Diverticulosis  . WISDOM TOOTH EXTRACTION      There were no vitals filed for this visit.  Subjective Assessment - 08/22/19 1206    Subjective   I would like to reclaim full use of my left hand    Currently in Pain?  Yes    Pain Score  3     Pain Location  Hand    Pain Orientation  Left    Pain Descriptors / Indicators  Aching;Throbbing    Pain Type  Acute pain    Pain Radiating Towards  wrist - voalr aspect    Pain Onset  1 to 4 weeks ago    Pain Frequency  Constant    Aggravating Factors   Vibration, or forced flexion increase pain symptoms    Pain Relieving Factors  rest    Multiple Pain Sites   No        [facility] OT Assessment - 08/22/19 0001      Assessment   Medical Diagnosis  Left hand trauma    Referring Provider (OT)  Raoul Pitch, Renee    Onset Date/Surgical Date  08/04/19    Hand Dominance  Right    Prior Therapy  NA      Prior Function   Vocation  Full time employment    Vocation Requirements  COMPUTER, DRAWING,     Leisure  Woodworking, hiking,       ADL   Eating/Feeding  Minimal assistance    Grooming  Modified independent    Upper Body Bathing  Modified independent    Lower Body Bathing  Increased time    Upper Body Dressing  Increased time;Needs assist for fasteners    Lower Body Dressing  Increased time      Written Expression   Dominant Hand  Right      Sensation   Light Touch  Impaired by gross assessment    Stereognosis  Not tested    Hot/Cold  Impaired by gross assessment  Proprioception  Not tested    Additional Comments  Median nerve distribution - palmar aspect > dorsal aspect.  Nerve pain at tip of thumb      Coordination   Gross Motor Movements are Fluid and Coordinated  Yes    Fine Motor Movements are Fluid and Coordinated  No    Coordination  Lacks opposition       Edema   Edema  Large hematoma in thenar eminence, Digits swollen      ROM / Strength   AROM / PROM / Strength  AROM      AROM   Overall AROM   Deficits    Overall AROM Comments  Lacks thumb MC flexion and oppostion       Left Hand AROM   L Thumb Radial ADduction/ABduction 0-55  45    L Thumb Palmar ADduction/ABduction 0-45  0    L Thumb Opposition to Index  0 cm    L Index  MCP 0-90  40 Degrees    L Index PIP 0-100  70 Degrees    L Long  MCP 0-90  50 Degrees    L Long PIP 0-100  80 Degrees    L Ring  MCP 0-90  50 Degrees    L Ring PIP 0-100  75 Degrees    L Little  MCP 0-90  75 Degrees    L Little PIP 0-100  85 Degrees      Hand Function   Right Hand Gross Grasp  Functional    Right Hand Grip (lbs)  110 lb    Right Hand Lateral Pinch  30 lbs    Right Hand 3  Point Pinch  20 lbs    Left Hand Gross Grasp  Impaired    Left Hand Grip (lbs)  10    Left Hand Lateral Pinch  1 lbs    Left 3 point pinch  --   unable                     OT Education - 08/22/19 1207    Education Details  edema massage with hand elevated/wrist neutral, followed by passive mass grasp with goal of finger pads to palm    Person(s) Educated  Patient    Methods  Explanation;Demonstration;Verbal cues    Comprehension  Verbalized understanding;Returned demonstration       OT Short Term Goals - 08/22/19 1216      OT SHORT TERM GOAL #1   Title  Patient will complete edema management effectively throughout day to help reduce overall swelling in left hand due 3/7    Time  4    Period  Weeks    Status  New    Target Date  09/21/19      OT SHORT TERM GOAL #2   Title  Patient will complete a home exercise program designed to improve range of motion in left digits/ thumb    Time  4    Period  Weeks    Status  New      OT SHORT TERM GOAL #3   Title  Patient will utilize left hand to stabilize a bottle (not new) while unscrewing cap with right hand    Time  4    Period  Weeks    Status  New      OT SHORT TERM GOAL #4   Title  Patient will demonstrate improved range of motion in index, middle and ring - to  60 MCP flexion actively to aide with gross grasp.    Time  4    Period  Weeks    Status  New      OT SHORT TERM GOAL #5   Title  Patient will utilize LUE to assist with bilateral cutting of food at table    Time  4    Period  Weeks    Status  New        OT Long Term Goals - 08/22/19 1221      OT LONG TERM GOAL #1   Title  Patient will complete update HEP to address edema management, range of motion and functional use of LUE    Time  8    Period  Weeks    Status  New    Target Date  10/21/19      OT LONG TERM GOAL #2   Title  Patient will demonstrate 30 lbs of grip strength to assist with functional grasp in non dominant LUE    Time  8     Period  Weeks    Status  New      OT LONG TERM GOAL #3   Title  Patient will oopose thumb to index, long, and ring fingertip to aide with functional pinch, picking up small objects    Time  8    Period  Weeks    Status  New      OT LONG TERM GOAL #4   Title  Patient will demonstrate improved ability to utilize functional pinch and manipulation to tie shoelaces    Time  8    Period  Weeks    Status  New      OT LONG TERM GOAL #5   Title  Patient will demonstrate adequate pinch strength to pull on socks using BUE - as evidenced by self report decreased time    Time  8    Period  Weeks    Status  New            Plan - 08/22/19 1209    Clinical Impression Statement  Patient is a 64 year old male who curt his hand with power tool (router) and required stitches on 08/04/19.  Patient had stitches removed last week, and continues with limited functional use of his left hand due to significant edema, hematoma, numbness, and decreased passive / active range of motion,weakness, and pain in thumb and digits 2-4.  Patient has numbness in apparent median nerve distribution - assume due to amount of swelling in hand base and wrist - will monitor.  Patient will benefit from skilled OT intervention to address edema, range of motion, scar management and to help restore functional use of LUE as non-dominant extremity.    OT Occupational Profile and History  Problem Focused Assessment - Including review of records relating to presenting problem    Occupational performance deficits (Please refer to evaluation for details):  ADL's;Leisure;IADL's;Rest and Sleep;Work    Marketing executive / Function / Physical Skills  ADL;Coordination;Endurance;Scar mobility;UE functional use;Decreased knowledge of precautions;Fascial restriction;Sensation;Skin integrity;Pain;IADL;Flexibility;Decreased knowledge of use of DME;Body mechanics;Dexterity;FMC;Wound;ROM;Mobility;Edema    Rehab Potential  Excellent    Clinical  Decision Making  Limited treatment options, no task modification necessary    Comorbidities Affecting Occupational Performance:  None    Modification or Assistance to Complete Evaluation   No modification of tasks or assist necessary to complete eval    OT Frequency  2x / week    OT  Duration  4 weeks   then 1x/week x 4 weeks   OT Treatment/Interventions  Self-care/ADL training;Electrical Stimulation;Therapeutic exercise;Patient/family education;Splinting;Compression bandaging;Neuromuscular education;Moist Heat;Fluidtherapy;Scar mobilization;Therapeutic activities;Passive range of motion;Manual Therapy;DME and/or AE instruction;Contrast Bath;Ultrasound    Plan  edema management, Educate in scar management, HEP for active (not resistive) motion of thumb and digits    OT Home Exercise Plan  Taught edema management, and passive range of motion for digits - mass grasp    Consulted and Agree with Plan of Care  Patient       Patient will benefit from skilled therapeutic intervention in order to improve the following deficits and impairments:   Body Structure / Function / Physical Skills: ADL, Coordination, Endurance, Scar mobility, UE functional use, Decreased knowledge of precautions, Fascial restriction, Sensation, Skin integrity, Pain, IADL, Flexibility, Decreased knowledge of use of DME, Body mechanics, Dexterity, FMC, Wound, ROM, Mobility, Edema       Visit Diagnosis: Localized edema - Plan: Ot plan of care cert/re-cert  Other disturbances of skin sensation - Plan: Ot plan of care cert/re-cert  Stiffness of left hand, not elsewhere classified - Plan: Ot plan of care cert/re-cert  Pain in left hand - Plan: Ot plan of care cert/re-cert    Problem List Patient Active Problem List   Diagnosis Date Noted  . Statin intolerance 05/08/2019  . Obesity (BMI 30-39.9) 05/07/2019  . 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 Diverticulitis of large intestine with perforation without bleeding 11/11/2018   . Nummular eczema 03/14/2018  . Prediabetes 02/26/2017  . CKD (chronic kidney disease) stage 3, GFR 30-59 ml/min 09/07/2016  . Swelling of both hands 11/12/2014  . Vitamin B12 deficiency 01/12/2014  . 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 HYPERTENSION, BENIGN ESSENTIAL 06/16/2009  . Hyperlipidemia LDL goal <130 02/26/2008    Mariah Milling, OTR/L 08/22/2019, 12:27 PM  [facility] [address] [place], [place], [address] Phone: [phone]   Fax:  [phone]  Name: ALASTAIR FORSHEE MRN: [id number] Date of Birth: [birth year]

## 2019-08-25 ENCOUNTER — Encounter: Payer: Self-pay | Admitting: Occupational Therapy

## 2019-08-25 ENCOUNTER — Other Ambulatory Visit: Payer: Self-pay

## 2019-08-25 ENCOUNTER — Ambulatory Visit: Payer: BC Managed Care – PPO | Admitting: Occupational Therapy

## 2019-08-25 DIAGNOSIS — R208 Other disturbances of skin sensation: Secondary | ICD-10-CM

## 2019-08-25 DIAGNOSIS — M25642 Stiffness of left hand, not elsewhere classified: Secondary | ICD-10-CM | POA: Diagnosis not present

## 2019-08-25 DIAGNOSIS — M79642 Pain in left hand: Secondary | ICD-10-CM

## 2019-08-25 DIAGNOSIS — R600 Localized edema: Secondary | ICD-10-CM | POA: Diagnosis not present

## 2019-08-25 DIAGNOSIS — R209 Unspecified disturbances of skin sensation: Secondary | ICD-10-CM | POA: Diagnosis not present

## 2019-08-25 NOTE — Therapy (Signed)
[facility] [address] [place], [place], [address] Phone: [phone]   Fax:  [phone]  Occupational Therapy Treatment  Patient Details  Name: Javier Casey MRN: [id number] Date of Birth: [birth year] Referring Provider (OT): Howard Pouch   Encounter Date: 08/25/2019  OT End of Session - 08/25/19 1844    Visit Number  2    Number of Visits  12    Authorization Type  BCBS - 30 VL    OT Start Time  [id number]    OT Stop Time  1825    OT Time Calculation (min)  50 min    Activity Tolerance  Patient tolerated treatment well    Behavior During Therapy  [facility] for tasks assessed/performed       Past Medical History:  Diagnosis Date  . Allergy    swelling lips ,nose, hands and feet, tongue, but unknown reason  . Asthma   . Colon polyps   . 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 Diverticulitis   . Diverticulosis 2016   There was severe diverticulosis noted in the ascending to sigmoid colon  . Epididymal cyst 02/2016   Multiple on scrotal u/s  . GERD (gastroesophageal reflux disease)   . Hyperlipidemia   . Hypertension   . Hyperuricemia   . Tubular adenoma of colon 2009; 2016    Past Surgical History:  Procedure Laterality Date  . APPENDECTOMY    . colonoscopy with polypectomy  2009; 12/01/2014   [facility].  +Diverticulosis  . WISDOM TOOTH EXTRACTION      There were no vitals filed for this visit.  Subjective Assessment - 08/25/19 1739    Subjective   Its a little better    Currently in Pain?  Yes    Pain Score  2     Pain Location  Hand    Pain Orientation  Left    Pain Descriptors / Indicators  Aching    Pain Type  Acute pain    Pain Radiating Towards  palm    Pain Onset  1 to 4 weeks ago    Pain Frequency  Constant    Aggravating Factors   periodic shooting nerve pain    Pain Relieving Factors  rest         [facility] OT Assessment - 08/25/19 0001      Left Hand AROM   L Index  MCP 0-90  50 Degrees     L Long  MCP 0-90  60 Degrees    L Ring  MCP 0-90  62 Degrees    L Little  MCP 0-90  75 Degrees               OT Treatments/Exercises (OP) - 08/25/19 0001      ADLs   ADL Comments  Patient is starting to use left hand functionally - e.g. to grasp sleeve to pull off jacket.       Hand Exercises   MCPJ Flexion  PROM;AROM    PIPJ Flexion  PROM;AROM    Joint Blocking Exercises  Thumb IP flex/ext, opposition, MP Flex    Other Hand Exercises  [place]an wrapped hand to promote MCP/PIP flexion.  Patient able to passively make pad to palm contact with digits 3-5, with increased time with digit 2.  Tolerated gentle wrist active motion with hand wrapped in flexion.        Modalities   Modalities  Ultrasound      Ultrasound  Ultrasound Location  palm, web space, volar wrist    Ultrasound Parameters  78mhz, pulsed, 0.8 w/cm2 x 8 min (protocol for superficial tendon/ligament acute)    Ultrasound Goals  Edema;Pain      Manual Therapy   Manual Therapy  Edema management;Soft tissue mobilization    Manual therapy comments  soft tissue mob - palm of hand, scar - not cross frictional,       Fine Motor Coordination (Hand/Wrist)   Fine Motor Coordination  Manipulation of small objects             OT Education - 08/25/19 1844    Education Details  gentle manipulation with gradually smaller objects - working with functional opposition    Person(s) Educated  Patient    Methods  Explanation;Demonstration    Comprehension  Verbalized understanding;Returned demonstration       OT Short Term Goals - 08/25/19 1848      OT SHORT TERM GOAL #1   Title  Patient will complete edema management effectively throughout day to help reduce overall swelling in left hand due 3/7    Time  4    Period  Weeks    Status  On-going      OT SHORT TERM GOAL #2   Title  Patient will complete a home exercise program designed to improve range of motion in left digits/ thumb    Time  4    Period  Weeks     Status  On-going      OT SHORT TERM GOAL #3   Title  Patient will utilize left hand to stabilize a bottle (not new) while unscrewing cap with right hand    Time  4    Period  Weeks    Status  On-going      OT SHORT TERM GOAL #4   Title  Patient will demonstrate improved range of motion in index, middle and ring - to 60 MCP flexion actively to aide with gross grasp.    Time  4    Period  Weeks    Status  On-going      OT SHORT TERM GOAL #5   Title  Patient will utilize LUE to assist with bilateral cutting of food at table    Time  4    Period  Weeks    Status  On-going        OT Long Term Goals - 08/25/19 1848      OT LONG TERM GOAL #1   Title  Patient will complete update HEP to address edema management, range of motion and functional use of LUE    Time  8    Period  Weeks    Status  On-going      OT LONG TERM GOAL #2   Title  Patient will demonstrate 30 lbs of grip strength to assist with functional grasp in non dominant LUE    Time  8    Period  Weeks    Status  On-going      OT LONG TERM GOAL #3   Title  Patient will oopose thumb to index, long, and ring fingertip to aide with functional pinch, picking up small objects    Time  8    Period  Weeks    Status  On-going      OT LONG TERM GOAL #4   Title  Patient will demonstrate improved ability to utilize functional pinch and manipulation to tie shoelaces    Time  8    Period  Weeks    Status  On-going      OT LONG TERM GOAL #5   Title  Patient will demonstrate adequate pinch strength to pull on socks using BUE - as evidenced by self report decreased time    Time  8    Period  Weeks    Status  On-going            Plan - 08/25/19 1845    Clinical Impression Statement  Patient is aggressively working to manage edema and range of motion in fingers.  Has noted improving sensation in ring and long finger.  Patient is beginning to use left hand to assist with functional tasks 20% time per his report.    OT  Frequency  2x / week    OT Duration  4 weeks   then 1x week 4 weeks   OT Treatment/Interventions  Self-care/ADL training;Electrical Stimulation;Therapeutic exercise;Patient/family education;Splinting;Compression bandaging;Neuromuscular education;Moist Heat;Fluidtherapy;Scar mobilization;Therapeutic activities;Passive range of motion;Manual Therapy;DME and/or AE instruction;Contrast Bath;Ultrasound    Plan  coordiantion exercises, edema management, scar management, HEP for active range of motion especially thumb, and index    OT Home Exercise Plan  Taught edema management, and passive range of motion for digits - mass grasp    Consulted and Agree with Plan of Care  Patient       Patient will benefit from skilled therapeutic intervention in order to improve the following deficits and impairments:           Visit Diagnosis: Localized edema  Other disturbances of skin sensation  Stiffness of left hand, not elsewhere classified  Pain in left hand    Problem List Patient Active Problem List   Diagnosis Date Noted  . Statin intolerance 05/08/2019  . Obesity (BMI 30-39.9) 05/07/2019  . 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 Diverticulitis of large intestine with perforation without bleeding 11/11/2018  . Nummular eczema 03/14/2018  . Prediabetes 02/26/2017  . CKD (chronic kidney disease) stage 3, GFR 30-59 ml/min 09/07/2016  . Swelling of both hands 11/12/2014  . Vitamin B12 deficiency 01/12/2014  . 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 HYPERTENSION, BENIGN ESSENTIAL 06/16/2009  . Hyperlipidemia LDL goal <130 02/26/2008    Mariah Milling, OTR/L 08/25/2019, 6:50 PM  [facility] [address] [place], [place], [address] Phone: [phone]   Fax:  [phone]  Name: Javier Casey MRN: [id number] Date of Birth: [birth year]

## 2019-09-01 ENCOUNTER — Encounter: Payer: Self-pay | Admitting: Occupational Therapy

## 2019-09-01 ENCOUNTER — Ambulatory Visit: Payer: BC Managed Care – PPO | Admitting: Occupational Therapy

## 2019-09-01 ENCOUNTER — Other Ambulatory Visit: Payer: Self-pay

## 2019-09-01 DIAGNOSIS — M79642 Pain in left hand: Secondary | ICD-10-CM

## 2019-09-01 DIAGNOSIS — M25642 Stiffness of left hand, not elsewhere classified: Secondary | ICD-10-CM

## 2019-09-01 DIAGNOSIS — R208 Other disturbances of skin sensation: Secondary | ICD-10-CM

## 2019-09-01 DIAGNOSIS — R209 Unspecified disturbances of skin sensation: Secondary | ICD-10-CM | POA: Diagnosis not present

## 2019-09-01 DIAGNOSIS — R600 Localized edema: Secondary | ICD-10-CM

## 2019-09-01 NOTE — Therapy (Signed)
[facility] [address] [place], [place], [address] Phone: [phone]   Fax:  [phone]  Occupational Therapy Treatment  Patient Details  Name: HARVEY MAXON MRN: [id number] Date of Birth: 1955-12-10 Referring Provider (OT): Howard Pouch   Encounter Date: 09/01/2019  OT End of Session - 09/01/19 1837    Visit Number  3    Number of Visits  12    Authorization Type  BCBS - 30 VL    OT Start Time  [id number]    OT Stop Time  [id number]    OT Time Calculation (min)  52 min    Activity Tolerance  Patient tolerated treatment well    Behavior During Therapy  [facility] for tasks assessed/performed       Past Medical History:  Diagnosis Date  . Allergy    swelling lips ,nose, hands and feet, tongue, but unknown reason  . Asthma   . Colon polyps   . 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 Diverticulitis   . Diverticulosis 2016   There was severe diverticulosis noted in the ascending to sigmoid colon  . Epididymal cyst 02/2016   Multiple on scrotal u/s  . GERD (gastroesophageal reflux disease)   . Hyperlipidemia   . Hypertension   . Hyperuricemia   . Tubular adenoma of colon 2009; 2016    Past Surgical History:  Procedure Laterality Date  . APPENDECTOMY    . colonoscopy with polypectomy  2009; 12/01/2014   [facility].  +Diverticulosis  . WISDOM TOOTH EXTRACTION      There were no vitals filed for this visit.  Subjective Assessment - 09/01/19 1737    Subjective   Its worse at night.    Currently in Pain?  Yes    Pain Score  2     Pain Location  Hand    Pain Orientation  Left    Pain Descriptors / Indicators  Aching    Pain Type  Acute pain    Pain Radiating Towards  palm, index finger, wrist    Pain Onset  1 to 4 weeks ago    Pain Frequency  Constant    Aggravating Factors   periodic shooting nerve pain    Pain Relieving Factors  rest         [facility] OT Assessment - 09/01/19 0001      Hand Function   Left Hand  Grip (lbs)  20    Left Hand Lateral Pinch  6 lbs    Left 3 point pinch  4 lbs               OT Treatments/Exercises (OP) - 09/01/19 0001      ADLs   LB Dressing  Patient indicated he could use his thumb to assist with pulling up sock today      Modalities   Modalities  Moist Heat      Manual Therapy   Manual Therapy  Soft tissue mobilization;Joint mobilization    Manual therapy comments  Palm of hand - webspace    Joint Mobilization  metacarpal spread, MCP flex      Fine Motor Coordination (Hand/Wrist)   Fine Motor Coordination  In hand manipuation training;Small Pegboard;Nuts and Bolts;Flipping cards             OT Education - 09/01/19 1836    Education Details  gentle mobility through simple coordination exercises, encouraged patient to speak with PCP about nerve pain as it is interferring  with sleep    Person(s) Educated  Patient    Methods  Explanation;Demonstration    Comprehension  Verbalized understanding;Returned demonstration       OT Short Term Goals - 09/01/19 1841      OT SHORT TERM GOAL #1   Title  Patient will complete edema management effectively throughout day to help reduce overall swelling in left hand due 3/7    Status  Achieved      OT SHORT TERM GOAL #2   Title  Patient will complete a home exercise program designed to improve range of motion in left digits/ thumb    Status  Achieved      OT SHORT TERM GOAL #3   Title  Patient will utilize left hand to stabilize a bottle (not new) while unscrewing cap with right hand    Status  On-going      OT SHORT TERM GOAL #4   Title  Patient will demonstrate improved range of motion in index, middle and ring - to 60 MCP flexion actively to aide with gross grasp.    Status  On-going      OT SHORT TERM GOAL #5   Title  Patient will utilize LUE to assist with bilateral cutting of food at table    Status  On-going        OT Long Term Goals - 08/25/19 1848      OT LONG TERM GOAL #1   Title   Patient will complete update HEP to address edema management, range of motion and functional use of LUE    Time  8    Period  Weeks    Status  On-going      OT LONG TERM GOAL #2   Title  Patient will demonstrate 30 lbs of grip strength to assist with functional grasp in non dominant LUE    Time  8    Period  Weeks    Status  On-going      OT LONG TERM GOAL #3   Title  Patient will oopose thumb to index, long, and ring fingertip to aide with functional pinch, picking up small objects    Time  8    Period  Weeks    Status  On-going      OT LONG TERM GOAL #4   Title  Patient will demonstrate improved ability to utilize functional pinch and manipulation to tie shoelaces    Time  8    Period  Weeks    Status  On-going      OT LONG TERM GOAL #5   Title  Patient will demonstrate adequate pinch strength to pull on socks using BUE - as evidenced by self report decreased time    Time  8    Period  Weeks    Status  On-going            Plan - 09/01/19 1838    Clinical Impression Statement  Patient showing improved range of motion, and strength in LUE.  Patient has constant pain 2/10 throughout left hand, and frequent shooting pain 8/10 during day - worse at night.    OT Frequency  2x / week    OT Duration  4 weeks    OT Treatment/Interventions  Self-care/ADL training;Electrical Stimulation;Therapeutic exercise;Patient/family education;Splinting;Compression bandaging;Neuromuscular education;Moist Heat;Fluidtherapy;Scar mobilization;Therapeutic activities;Passive range of motion;Manual Therapy;DME and/or AE instruction;Contrast Bath;Ultrasound    Plan  thumb opposition, abduction, webspace stretch, ROM to increase grip and opposition, functional use of LUE, pain  management    OT Home Exercise Plan  edema management, passive motion, coordination exercises, moist heat modality    Consulted and Agree with Plan of Care  Patient       Patient will benefit from skilled therapeutic  intervention in order to improve the following deficits and impairments:           Visit Diagnosis: Localized edema  Other disturbances of skin sensation  Stiffness of left hand, not elsewhere classified  Pain in left hand    Problem List Patient Active Problem List   Diagnosis Date Noted  . Statin intolerance 05/08/2019  . Obesity (BMI 30-39.9) 05/07/2019  . 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 Diverticulitis of large intestine with perforation without bleeding 11/11/2018  . Nummular eczema 03/14/2018  . Prediabetes 02/26/2017  . CKD (chronic kidney disease) stage 3, GFR 30-59 ml/min 09/07/2016  . Swelling of both hands 11/12/2014  . Vitamin B12 deficiency 01/12/2014  . Calcified granuloma of lung ([place]) 12/02/2013  . HYPERTENSION, BENIGN ESSENTIAL 06/16/2009  . Hyperlipidemia LDL goal <130 02/26/2008    Mariah Milling , OTR/L 09/01/2019, 6:42 PM  [facility] [address] [place], [place], [address] Phone: [phone]   Fax:  [phone]  Name: KASHIEF NOVEMBER MRN: [id number] Date of Birth: Nov 24, 1955

## 2019-09-01 NOTE — Patient Instructions (Signed)
  Coordination Activities  Perform the following activities for minutes 5-10 times per day with left hand(s).   Rotate ball in fingertips (clockwise and counter-clockwise).  Flip cards 1 at a time as fast as you can.  Shuffle cards.  Twirl pen between fingers.  Screw together nuts and bolts, then unfasten.

## 2019-09-02 ENCOUNTER — Encounter: Payer: Self-pay | Admitting: Family Medicine

## 2019-09-02 NOTE — Telephone Encounter (Signed)
Please inform patient there are many different types of medications including anti-inflammatories for pain.  There are over-the-counter anti-inflammatory such as ibuprofen, Motrin or naproxen that he can take if he is able to tolerate.  There are also prescribed pain medications such as tramadol, Vicodin and Percocet.  All of these potentially have at least some addictive properties, tramadol being the least potential for addiction out of the group.  There are also certain type of medications like gabapentin that helps decrease nerve sensation pain.  There are precautions that need to be discussed with all of these options.  Please set him for a virtual visit to discuss in more detail.

## 2019-09-02 NOTE — Telephone Encounter (Signed)
Please advise 

## 2019-09-08 ENCOUNTER — Encounter: Payer: Self-pay | Admitting: Occupational Therapy

## 2019-09-08 ENCOUNTER — Ambulatory Visit: Payer: BC Managed Care – PPO | Admitting: Occupational Therapy

## 2019-09-08 ENCOUNTER — Other Ambulatory Visit: Payer: Self-pay

## 2019-09-08 DIAGNOSIS — M25642 Stiffness of left hand, not elsewhere classified: Secondary | ICD-10-CM

## 2019-09-08 DIAGNOSIS — M79642 Pain in left hand: Secondary | ICD-10-CM | POA: Diagnosis not present

## 2019-09-08 DIAGNOSIS — R600 Localized edema: Secondary | ICD-10-CM | POA: Diagnosis not present

## 2019-09-08 DIAGNOSIS — R208 Other disturbances of skin sensation: Secondary | ICD-10-CM

## 2019-09-08 DIAGNOSIS — R209 Unspecified disturbances of skin sensation: Secondary | ICD-10-CM | POA: Diagnosis not present
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
## 2019-09-11 ENCOUNTER — Ambulatory Visit: Payer: BC Managed Care – PPO | Admitting: Occupational Therapy

## 2019-09-11 ENCOUNTER — Other Ambulatory Visit: Payer: Self-pay

## 2019-09-11 DIAGNOSIS — R209 Unspecified disturbances of skin sensation: Secondary | ICD-10-CM | POA: Diagnosis not present

## 2019-09-11 DIAGNOSIS — M25642 Stiffness of left hand, not elsewhere classified: Secondary | ICD-10-CM | POA: Diagnosis not present

## 2019-09-11 DIAGNOSIS — R600 Localized edema: Secondary | ICD-10-CM | POA: Diagnosis not present

## 2019-09-11 DIAGNOSIS — M79642 Pain in left hand: Secondary | ICD-10-CM

## 2019-09-11 DIAGNOSIS — R208 Other disturbances of skin sensation: Secondary | ICD-10-CM

## 2019-09-11 NOTE — Patient Instructions (Signed)
  Contrast Bath  Prepare the baths.  Cold = 55-65 degrees     Hot = 105-110 degrees  TEST TEMPERATURE WITH OTHER HAND FIRST  Starting with the hot, dip hand or foot all the way into the water and hold there for selected duration. Preferably 3 minutes.  After selected duration is up, dip hand or foot into the cold for 1/3 duration of the hot. (3 minutes hot, 1 minute cold)  Alternate back and forth for the times indicated for no more than a total of 20 minutes ending with hot.  Elevate hand afterwards   Opposition (Active)   Touch tip of thumb to nail tip of each finger in turn, making an "O" shape. Repeat __10__ times. Do _4-6___ sessions per day.   MP Flexion (Active)   Bend thumb to touch base of little finger, keeping tip joint straight. Stretch further with other hand Repeat __10-15__ times. Do _4-6___ sessions per day.    Radial Adduction/Abduction (Active)    Move thumb out to side. Move back alongside index finger. Repeat __10__ times. Do _4-6___ sessions per day.  AROM: Thumb Abduction / Adduction    Actively pull left thumb away from palm as far as possible. Hold _5___ seconds. Then bring thumb back to touch fingers. Try not to bend fingers toward thumb. Repeat _10___ times per set. Do _1__ sets per session. Do __4-6__ sessions per day. Stretch further with other hand    AROM: Finger Flexion / Extension    Actively bend fingers of right hand. Start with knuckles furthest from palm, and slowly make a fist. Hold __5__ seconds. Relax. Then straighten fingers as far as possible. Repeat _10___ times per set. Do ____ sets per session. Do _4-6___ sessions per day.  Copyright  VHI. All rights reserved.

## 2019-09-11 NOTE — Therapy (Signed)
[facility] [address] [place], [place], [address] Phone: [phone]   Fax:  [phone]  Occupational Therapy Treatment  Patient Details  Name: Javier Casey MRN: [id number] Date of Birth: [birth year] Referring Provider (OT): Howard Pouch   Encounter Date: 09/11/2019  OT End of Session - 09/11/19 1828    Visit Number  5    Number of Visits  12    Authorization Type  BCBS - 30 VL    OT Start Time  [id number]    OT Stop Time  [id number]    OT Time Calculation (min)  55 min       Past Medical History:  Diagnosis Date  . Allergy    swelling lips ,nose, hands and feet, tongue, but unknown reason  . Asthma   . Colon polyps   . 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 Diverticulitis   . Diverticulosis 2016   There was severe diverticulosis noted in the ascending to sigmoid colon  . Epididymal cyst 02/2016   Multiple on scrotal u/s  . GERD (gastroesophageal reflux disease)   . Hyperlipidemia   . Hypertension   . Hyperuricemia   . Tubular adenoma of colon 2009; 2016    Past Surgical History:  Procedure Laterality Date  . APPENDECTOMY    . colonoscopy with polypectomy  2009; 12/01/2014   [facility].  +Diverticulosis  . WISDOM TOOTH EXTRACTION      There were no vitals filed for this visit.  Subjective Assessment - 09/11/19 1728    Currently in Pain?  Yes    Pain Score  2     Pain Location  Hand    Pain Orientation  Left    Pain Descriptors / Indicators  Aching;Burning   BURNING fingertips of first 3 fingers   Pain Type  Acute pain    Pain Onset  More than a month ago    Pain Frequency  Constant    Aggravating Factors   sudden movement, pressure on median nerve    Pain Relieving Factors  rest        Cold pack to Lt hand while issuing edema management strategies handout and reviewed, as well as reviewing contrast bath info. Pt also issued compression glove and discussed wear and care. (cold pack helped decreased  edema dorsally but not volarly) Pt issued HEP with focus on thumb ROM especially opposition and palmer abduction - pt instructed to perform A/ROM, then place and hold further w/ other hand. See pt instructions for details. Also worked on A/ROM and P/ROM in full composite flexion (after pulsed [place]) Pt shown AA/ROM ex he can do for palmer abduction with medicine bottle. Ultrasound x 8 min to entire palm of hand - 20% pulsed, 3 Mhz, 0.8 wts/cm2 for pain and edema. Pt with decreased edema noted after. Performed P/ROM following                     OT Education - 09/11/19 1756    Education Details  compression glove wear and care, contrast bath info, handout on edema management w/ 2 ex's (finger abd/add, and MP flexion), and HEP    Person(s) Educated  Patient    Methods  Explanation;Demonstration;Handout    Comprehension  Verbalized understanding;Returned demonstration       OT Short Term Goals - 09/08/19 1802      OT SHORT TERM GOAL #1   Title  Patient will complete edema management effectively throughout day  to help reduce overall swelling in left hand due 3/7    Status  Achieved      OT SHORT TERM GOAL #2   Title  Patient will complete a home exercise program designed to improve range of motion in left digits/ thumb    Status  Achieved      OT SHORT TERM GOAL #3   Title  Patient will utilize left hand to stabilize a bottle (not new) while unscrewing cap with right hand    Status  On-going      OT SHORT TERM GOAL #4   Title  Patient will demonstrate improved range of motion in index, middle and ring - to 60 MCP flexion actively to aide with gross grasp.    Status  On-going      OT SHORT TERM GOAL #5   Title  Patient will utilize LUE to assist with bilateral cutting of food at table    Status  On-going        OT Long Term Goals - 08/25/19 1848      OT LONG TERM GOAL #1   Title  Patient will complete update HEP to address edema management, range of motion and  functional use of LUE    Time  8    Period  Weeks    Status  On-going      OT LONG TERM GOAL #2   Title  Patient will demonstrate 30 lbs of grip strength to assist with functional grasp in non dominant LUE    Time  8    Period  Weeks    Status  On-going      OT LONG TERM GOAL #3   Title  Patient will oopose thumb to index, long, and ring fingertip to aide with functional pinch, picking up small objects    Time  8    Period  Weeks    Status  On-going      OT LONG TERM GOAL #4   Title  Patient will demonstrate improved ability to utilize functional pinch and manipulation to tie shoelaces    Time  8    Period  Weeks    Status  On-going      OT LONG TERM GOAL #5   Title  Patient will demonstrate adequate pinch strength to pull on socks using BUE - as evidenced by self report decreased time    Time  8    Period  Weeks    Status  On-going            Plan - 09/11/19 1829    Clinical Impression Statement  Pt w/ significant edema which may be causing CTS symptoms. Pt w/ decreased edema at end of session after use of modalities and ex's    Occupational performance deficits (Please refer to evaluation for details):  ADL's;Leisure;IADL's;Rest and Sleep;Work    Marketing executive / Function / Physical Skills  ADL;Coordination;Endurance;Scar mobility;UE functional use;Decreased knowledge of precautions;Fascial restriction;Sensation;Skin integrity;Pain;IADL;Flexibility;Decreased knowledge of use of DME;Body mechanics;Dexterity;FMC;Wound;ROM;Mobility;Edema    Rehab Potential  Excellent    Comorbidities Affecting Occupational Performance:  None    OT Frequency  2x / week    OT Duration  4 weeks   then 1x/wk for 4 weeks   OT Treatment/Interventions  Self-care/ADL training;Electrical Stimulation;Therapeutic exercise;Patient/family education;Splinting;Compression bandaging;Neuromuscular education;Moist Heat;Fluidtherapy;Scar mobilization;Therapeutic activities;Passive range of motion;Manual  Therapy;DME and/or AE instruction;Contrast Bath;Ultrasound    Plan  consider web space splint in palmer abduction, continue to address edema and ROM Lt hand/thumb  Consulted and Agree with Plan of Care  Patient       Patient will benefit from skilled therapeutic intervention in order to improve the following deficits and impairments:   Body Structure / Function / Physical Skills: ADL, Coordination, Endurance, Scar mobility, UE functional use, Decreased knowledge of precautions, Fascial restriction, Sensation, Skin integrity, Pain, IADL, Flexibility, Decreased knowledge of use of DME, Body mechanics, Dexterity, FMC, Wound, ROM, Mobility, Edema       Visit Diagnosis: Localized edema  Other disturbances of skin sensation  Stiffness of left hand, not elsewhere classified  Pain in left hand    Problem List Patient Active Problem List   Diagnosis Date Noted  . Statin intolerance 05/08/2019  . Obesity (BMI 30-39.9) 05/07/2019  . 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 Diverticulitis of large intestine with perforation without bleeding 11/11/2018  . Nummular eczema 03/14/2018  . Prediabetes 02/26/2017  . CKD (chronic kidney disease) stage 3, GFR 30-59 ml/min 09/07/2016  . Swelling of both hands 11/12/2014  . Vitamin B12 deficiency 01/12/2014  . Calcified granuloma of lung ([place]) 12/02/2013  . HYPERTENSION, BENIGN ESSENTIAL 06/16/2009  . Hyperlipidemia LDL goal <130 02/26/2008    Carey Bullocks, OTR/L 09/11/2019, 6:31 PM  [facility] [address] [place], [place], [address] Phone: [phone]   Fax:  [phone]  Name: SUHAIB COBBLE MRN: [id number] Date of Birth: [birth year]

## 2019-09-15 ENCOUNTER — Encounter: Payer: Self-pay | Admitting: Occupational Therapy

## 2019-09-15 ENCOUNTER — Ambulatory Visit: Payer: BC Managed Care – PPO | Attending: Family Medicine | Admitting: Occupational Therapy

## 2019-09-15 ENCOUNTER — Other Ambulatory Visit: Payer: Self-pay

## 2019-09-15 DIAGNOSIS — R209 Unspecified disturbances of skin sensation: Secondary | ICD-10-CM | POA: Diagnosis not present

## 2019-09-15 DIAGNOSIS — M25642 Stiffness of left hand, not elsewhere classified: Secondary | ICD-10-CM | POA: Insufficient documentation

## 2019-09-15 DIAGNOSIS — R208 Other disturbances of skin sensation: Secondary | ICD-10-CM

## 2019-09-15 DIAGNOSIS — R600 Localized edema: Secondary | ICD-10-CM

## 2019-09-15 DIAGNOSIS — M79642 Pain in left hand: Secondary | ICD-10-CM

## 2019-09-15 NOTE — Therapy (Signed)
[facility] [address] [place], [place], [address] Phone: [phone]   Fax:  [phone]  Occupational Therapy Treatment  Patient Details  Name: Javier Casey MRN: [id number] Date of Birth: 1955-12-11 Referring Provider (OT): Howard Pouch   Encounter Date: 09/15/2019  OT End of Session - 09/15/19 1802    Visit Number  6    Number of Visits  12    Authorization Type  [employer] - 30 VL    OT Start Time  1500    OT Stop Time  1545    OT Time Calculation (min)  45 min    Activity Tolerance  Patient tolerated treatment well    Behavior During Therapy  [facility] for tasks assessed/performed       Past Medical History:  Diagnosis Date  . Allergy    swelling lips ,nose, hands and feet, tongue, but unknown reason  . Asthma   . Colon polyps   . 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 Diverticulitis   . Diverticulosis 2016   There was severe diverticulosis noted in the ascending to sigmoid colon  . Epididymal cyst 02/2016   Multiple on scrotal u/s  . GERD (gastroesophageal reflux disease)   . Hyperlipidemia   . Hypertension   . Hyperuricemia   . Tubular adenoma of colon 2009; 2016    Past Surgical History:  Procedure Laterality Date  . APPENDECTOMY    . colonoscopy with polypectomy  2009; 12/01/2014   [facility].  +Diverticulosis  . WISDOM TOOTH EXTRACTION      There were no vitals filed for this visit.  Subjective Assessment - 09/15/19 1709    Subjective   I went to the beach this weekend unexpectedly    Currently in Pain?  Yes    Pain Score  2     Pain Location  Hand    Pain Orientation  Left    Pain Descriptors / Indicators  Aching;Burning    Pain Type  Acute pain    Pain Onset  More than a month ago    Pain Frequency  Constant    Aggravating Factors   bumping it, sudden movement    Pain Relieving Factors  rest                   OT Treatments/Exercises (OP) - 09/15/19 0001      Ultrasound    Ultrasound Location  palm, webspace, heel of hand, volar wrist, dorsum index MCP/PIP    Ultrasound Parameters  78mhz, continuous, 0.8 w/cm2, x 10 min    Ultrasound Goals  Edema;Pain      Manual Therapy   Manual Therapy  Edema management;Joint mobilization    Manual therapy comments  palm of hand, webspace    Edema Management  Edema present in palm of hand, digits and dorsum of hand    Joint Mobilization  Index - MCP,PIP to encourage greater flexion.  Today able to passively make pad to palm contact following ultrasound, joint mobilization and stretch             OT Education - 09/15/19 1800    Education Details  edema management - need consistent emphasis.  May need additional medical support to manage edema.  Patient encouraged to consider anti-inflammatory OTC. Also talked with patient about expectations for recovery - slow.  Need to consider where progress has been made    Person(s) Educated  Patient    Methods  Explanation  Comprehension  Verbalized understanding       OT Short Term Goals - 09/08/19 1802      OT SHORT TERM GOAL #1   Title  Patient will complete edema management effectively throughout day to help reduce overall swelling in left hand due 3/7    Status  Achieved      OT SHORT TERM GOAL #2   Title  Patient will complete a home exercise program designed to improve range of motion in left digits/ thumb    Status  Achieved      OT SHORT TERM GOAL #3   Title  Patient will utilize left hand to stabilize a bottle (not new) while unscrewing cap with right hand    Status  On-going      OT SHORT TERM GOAL #4   Title  Patient will demonstrate improved range of motion in index, middle and ring - to 60 MCP flexion actively to aide with gross grasp.    Status  On-going      OT SHORT TERM GOAL #5   Title  Patient will utilize LUE to assist with bilateral cutting of food at table    Status  On-going        OT Long Term Goals - 08/25/19 1848      OT LONG TERM  GOAL #1   Title  Patient will complete update HEP to address edema management, range of motion and functional use of LUE    Time  8    Period  Weeks    Status  On-going      OT LONG TERM GOAL #2   Title  Patient will demonstrate 30 lbs of grip strength to assist with functional grasp in non dominant LUE    Time  8    Period  Weeks    Status  On-going      OT LONG TERM GOAL #3   Title  Patient will oopose thumb to index, long, and ring fingertip to aide with functional pinch, picking up small objects    Time  8    Period  Weeks    Status  On-going      OT LONG TERM GOAL #4   Title  Patient will demonstrate improved ability to utilize functional pinch and manipulation to tie shoelaces    Time  8    Period  Weeks    Status  On-going      OT LONG TERM GOAL #5   Title  Patient will demonstrate adequate pinch strength to pull on socks using BUE - as evidenced by self report decreased time    Time  8    Period  Weeks    Status  On-going            Plan - 09/15/19 1803    Clinical Impression Statement  Patient with continued edema, and has had limited consistent approach to management outside of therapy.  atient responding well to modalities - may need additional medical support for edema management.    OT Frequency  2x / week    OT Duration  4 weeks    OT Treatment/Interventions  Self-care/ADL training;Electrical Stimulation;Therapeutic exercise;Patient/family education;Splinting;Compression bandaging;Neuromuscular education;Moist Heat;Fluidtherapy;Scar mobilization;Therapeutic activities;Passive range of motion;Manual Therapy;DME and/or AE instruction;Contrast Bath;Ultrasound    Plan  consider web space splint in palmer abduction, Edema management, digit range - specifically thumb, index.    OT Home Exercise Plan  edema management, passive motion, coordination exercises, moist heat modality, scar massage, thumb  rom    Consulted and Agree with Plan of Care  Patient        Patient will benefit from skilled therapeutic intervention in order to improve the following deficits and impairments:           Visit Diagnosis: Localized edema  Other disturbances of skin sensation  Stiffness of left hand, not elsewhere classified  Pain in left hand    Problem List Patient Active Problem List   Diagnosis Date Noted  . Statin intolerance 05/08/2019  . Obesity (BMI 30-39.9) 05/07/2019  . 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 Diverticulitis of large intestine with perforation without bleeding 11/11/2018  . Nummular eczema 03/14/2018  . Prediabetes 02/26/2017  . CKD (chronic kidney disease) stage 3, GFR 30-59 ml/min 09/07/2016  . Swelling of both hands 11/12/2014  . Vitamin B12 deficiency 01/12/2014  . Calcified granuloma of lung ([place]) 12/02/2013  . HYPERTENSION, BENIGN ESSENTIAL 06/16/2009  . Hyperlipidemia LDL goal <130 02/26/2008    Mariah Milling, OTR/L 09/15/2019, 6:06 PM  [facility] [address] [place], [place], [address] Phone: [phone]   Fax:  [phone]  Name: Javier Casey MRN: [id number] Date of Birth: 11-Nov-1955

## 2019-09-17 DIAGNOSIS — R3912 Poor urinary stream: Secondary | ICD-10-CM | POA: Diagnosis not present

## 2019-09-17 DIAGNOSIS — N3020 Other chronic cystitis without hematuria: Secondary | ICD-10-CM | POA: Diagnosis not present

## 2019-09-18 ENCOUNTER — Other Ambulatory Visit: Payer: Self-pay

## 2019-09-18 ENCOUNTER — Encounter: Payer: Self-pay | Admitting: Occupational Therapy

## 2019-09-18 ENCOUNTER — Ambulatory Visit: Payer: BC Managed Care – PPO | Admitting: Occupational Therapy

## 2019-09-18 DIAGNOSIS — M25642 Stiffness of left hand, not elsewhere classified: Secondary | ICD-10-CM | POA: Diagnosis not present

## 2019-09-18 DIAGNOSIS — R600 Localized edema: Secondary | ICD-10-CM | POA: Diagnosis not present

## 2019-09-18 DIAGNOSIS — R208 Other disturbances of skin sensation: Secondary | ICD-10-CM

## 2019-09-18 DIAGNOSIS — M79642 Pain in left hand: Secondary | ICD-10-CM | POA: Diagnosis not present

## 2019-09-18 DIAGNOSIS — R209 Unspecified disturbances of skin sensation: Secondary | ICD-10-CM | POA: Diagnosis not present

## 2019-09-18 NOTE — Therapy (Signed)
[facility] [address] [place], [place], [address] Phone: [phone]   Fax:  [phone]  Occupational Therapy Treatment  Patient Details  Name: Javier Casey MRN: [id number] Date of Birth: 1956/04/19 Referring Provider (OT): Howard Pouch   Encounter Date: 09/18/2019  OT End of Session - 09/18/19 1745    Visit Number  7    Number of Visits  12    Authorization Type  BCBS - 30 VL    OT Start Time  1700    OT Stop Time  1745    OT Time Calculation (min)  45 min    Activity Tolerance  Patient tolerated treatment well    Behavior During Therapy  [facility] for tasks assessed/performed       Past Medical History:  Diagnosis Date  . Allergy    swelling lips ,nose, hands and feet, tongue, but unknown reason  . Asthma   . Colon polyps   . 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 Diverticulitis   . Diverticulosis 2016   There was severe diverticulosis noted in the ascending to sigmoid colon  . Epididymal cyst 02/2016   Multiple on scrotal u/s  . GERD (gastroesophageal reflux disease)   . Hyperlipidemia   . Hypertension   . Hyperuricemia   . Tubular adenoma of colon 2009; 2016    Past Surgical History:  Procedure Laterality Date  . APPENDECTOMY    . colonoscopy with polypectomy  2009; 12/01/2014   [facility].  +Diverticulosis  . WISDOM TOOTH EXTRACTION      There were no vitals filed for this visit.  Subjective Assessment - 09/18/19 1659    Subjective   I think its a little better - been using hot and cold, and working it.    Currently in Pain?  Yes    Pain Score  1     Pain Location  Hand    Pain Orientation  Left    Pain Descriptors / Indicators  Aching;Tingling    Pain Type  Acute pain    Pain Onset  More than a month ago    Pain Frequency  Constant    Aggravating Factors   bumping, sudden movement    Pain Relieving Factors  rest         [facility] OT Assessment - 09/18/19 0001      Left Hand AROM   L  Index  MCP 0-90  75 Degrees    L Index PIP 0-100  90 Degrees               OT Treatments/Exercises (OP) - 09/18/19 0001      Hand Exercises   Other Hand Exercises  Passive to active range of motion to digits for composite flexion      Ultrasound   Ultrasound Location  palm, webspace, volar wrist, index volar and dorsal    Ultrasound Parameters  70mhz, continuous, 0.8 w/cm2 x10 min    Ultrasound Goals  Edema;Pain      LUE Fluidotherapy   Number Minutes Fluidotherapy  10 Minutes    LUE Fluidotherapy Location  Hand;Wrist;Forearm    Comments  wrapped in composite flexion - gentle wrist and forearm motion while in fluido      Manual Therapy   Manual therapy comments  palm of hand     Edema Management  Iindex, volar wrist    Joint Mobilization  Index - MCP/PIP  OT Education - 09/18/19 1744    Education Details  reinforced benefit of aggressive edema management, progress toward improved index sensation, nd range of motion, improved thumb range of motion    Person(s) Educated  Patient    Methods  Explanation    Comprehension  Verbalized understanding       OT Short Term Goals - 09/18/19 1746      OT SHORT TERM GOAL #1   Title  Patient will complete edema management effectively throughout day to help reduce overall swelling in left hand due 3/7    Status  Achieved      OT SHORT TERM GOAL #2   Title  Patient will complete a home exercise program designed to improve range of motion in left digits/ thumb    Status  Achieved      OT SHORT TERM GOAL #3   Title  Patient will utilize left hand to stabilize a bottle (not new) while unscrewing cap with right hand      OT SHORT TERM GOAL #4   Title  Patient will demonstrate improved range of motion in index, middle and ring - to 60 MCP flexion actively to aide with gross grasp.    Status  On-going      OT SHORT TERM GOAL #5   Title  Patient will utilize LUE to assist with bilateral cutting of food at table     Status  On-going        OT Long Term Goals - 08/25/19 1848      OT LONG TERM GOAL #1   Title  Patient will complete update HEP to address edema management, range of motion and functional use of LUE    Time  8    Period  Weeks    Status  On-going      OT LONG TERM GOAL #2   Title  Patient will demonstrate 30 lbs of grip strength to assist with functional grasp in non dominant LUE    Time  8    Period  Weeks    Status  On-going      OT LONG TERM GOAL #3   Title  Patient will oopose thumb to index, long, and ring fingertip to aide with functional pinch, picking up small objects    Time  8    Period  Weeks    Status  On-going      OT LONG TERM GOAL #4   Title  Patient will demonstrate improved ability to utilize functional pinch and manipulation to tie shoelaces    Time  8    Period  Weeks    Status  On-going      OT LONG TERM GOAL #5   Title  Patient will demonstrate adequate pinch strength to pull on socks using BUE - as evidenced by self report decreased time    Time  8    Period  Weeks    Status  On-going            Plan - 09/18/19 1745    Clinical Impression Statement  Patient with slight decrease in edema in palm of hand and volar wrist - pain improving overall, sensation seems to be returning in index and long finger    OT Frequency  2x / week    OT Duration  4 weeks    OT Treatment/Interventions  Self-care/ADL training;Electrical Stimulation;Therapeutic exercise;Patient/family education;Splinting;Compression bandaging;Neuromuscular education;Moist Heat;Fluidtherapy;Scar mobilization;Therapeutic activities;Passive range of motion;Manual Therapy;DME and/or AE instruction;Contrast Bath;Ultrasound  Plan  edema management, digit range - specifically thumb and index    OT Home Exercise Plan  edema management, passive motion, coordination exercises, moist heat modality, scar massage, thumb rom    Consulted and Agree with Plan of Care  Patient       Patient  will benefit from skilled therapeutic intervention in order to improve the following deficits and impairments:           Visit Diagnosis: Localized edema  Other disturbances of skin sensation  Stiffness of left hand, not elsewhere classified  Pain in left hand    Problem List Patient Active Problem List   Diagnosis Date Noted  . Statin intolerance 05/08/2019  . Obesity (BMI 30-39.9) 05/07/2019  . 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 Diverticulitis of large intestine with perforation without bleeding 11/11/2018  . Nummular eczema 03/14/2018  . Prediabetes 02/26/2017  . CKD (chronic kidney disease) stage 3, GFR 30-59 ml/min 09/07/2016  . Swelling of both hands 11/12/2014  . Vitamin B12 deficiency 01/12/2014  . Calcified granuloma of lung ([place]) 12/02/2013  . HYPERTENSION, BENIGN ESSENTIAL 06/16/2009  . Hyperlipidemia LDL goal <130 02/26/2008    Mariah Milling, OTR/L 09/18/2019, 5:48 PM  [facility] [address] [place], [place], [address] Phone: [phone]   Fax:  [phone]  Name: Javier Casey MRN: [id number] Date of Birth: 1955-12-24

## 2019-09-22 ENCOUNTER — Other Ambulatory Visit: Payer: Self-pay

## 2019-09-22 ENCOUNTER — Ambulatory Visit: Payer: BC Managed Care – PPO | Admitting: Occupational Therapy

## 2019-09-22 ENCOUNTER — Encounter: Payer: Self-pay | Admitting: Occupational Therapy

## 2019-09-22 DIAGNOSIS — M79642 Pain in left hand: Secondary | ICD-10-CM

## 2019-09-22 DIAGNOSIS — M25642 Stiffness of left hand, not elsewhere classified: Secondary | ICD-10-CM | POA: Diagnosis not present

## 2019-09-22 DIAGNOSIS — R600 Localized edema: Secondary | ICD-10-CM | POA: Diagnosis not present

## 2019-09-22 DIAGNOSIS — R208 Other disturbances of skin sensation: Secondary | ICD-10-CM

## 2019-09-22 DIAGNOSIS — R209 Unspecified disturbances of skin sensation: Secondary | ICD-10-CM | POA: Diagnosis not present

## 2019-09-22 NOTE — Therapy (Signed)
[facility] [address] [place], [place], [address] Phone: [phone]   Fax:  [phone]  Occupational Therapy Treatment  Patient Details  Name: Javier Casey MRN: [id number] Date of Birth: 1955-07-29 Referring Provider (OT): Howard Pouch   Encounter Date: 09/22/2019  OT End of Session - 09/22/19 1814    Visit Number  8    Number of Visits  12    Authorization Type  BCBS - 30 VL    OT Start Time  1703    OT Stop Time  1750    OT Time Calculation (min)  47 min    Activity Tolerance  Patient tolerated treatment well    Behavior During Therapy  [facility] for tasks assessed/performed       Past Medical History:  Diagnosis Date  . Allergy    swelling lips ,nose, hands and feet, tongue, but unknown reason  . Asthma   . Colon polyps   . 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 Diverticulitis   . Diverticulosis 2016   There was severe diverticulosis noted in the ascending to sigmoid colon  . Epididymal cyst 02/2016   Multiple on scrotal u/s  . GERD (gastroesophageal reflux disease)   . Hyperlipidemia   . Hypertension   . Hyperuricemia   . Tubular adenoma of colon 2009; 2016    Past Surgical History:  Procedure Laterality Date  . APPENDECTOMY    . colonoscopy with polypectomy  2009; 12/01/2014   [facility].  +Diverticulosis  . WISDOM TOOTH EXTRACTION      There were no vitals filed for this visit.  Subjective Assessment - 09/22/19 1711    Subjective   I have no pain.  I am feeling a little more (dorsum of index finger)    Currently in Pain?  No/denies    Pain Score  0-No pain                   OT Treatments/Exercises (OP) - 09/22/19 0001      Hand Exercises   MCPJ Flexion  AROM;PROM;Left    PIPJ Flexion  PROM;AROM;Left    Other Hand Exercises  Beginning resistance training with large gripper on lowest setting x 10 blocks, digiflex 1.5 lb resistance composite (not ind) flexion and hold x 5  reps.  Working toward improved prehension sklls and some in hand manipulation skills.      Other Hand Exercises  Active thumb flexion/abduction follwing [place] and stretch.        Ultrasound   Ultrasound Location  Palm, web space, scar, volar wrist    Ultrasound Parameters  1mhz, Continuous, 3 mhz, 0.8 w/cm2 x 8 min    Ultrasound Goals  Edema;Pain      LUE Fluidotherapy   Number Minutes Fluidotherapy  10 Minutes    LUE Fluidotherapy Location  Hand;Wrist;Forearm    Comments  wrapped in composite flexion, working on wrist mobility to stretch flex tendons             OT Education - 09/22/19 1813    Education Details  Patient asking about use of TENS unit to help with edema.  Discussed that this may be beneficial, in conjunction with elevation    Person(s) Educated  Patient    Methods  Explanation    Comprehension  Need further instruction       OT Short Term Goals - 09/22/19 1817      OT SHORT TERM GOAL #1   Title  Patient will complete edema management effectively throughout day to help reduce overall swelling in left hand due 3/7    Status  Achieved      OT SHORT TERM GOAL #2   Title  Patient will complete a home exercise program designed to improve range of motion in left digits/ thumb    Status  Achieved      OT SHORT TERM GOAL #3   Title  Patient will utilize left hand to stabilize a bottle (not new) while unscrewing cap with right hand    Status  Achieved      OT SHORT TERM GOAL #4   Title  Patient will demonstrate improved range of motion in index, middle and ring - to 60 MCP flexion actively to aide with gross grasp.    Status  On-going      OT SHORT TERM GOAL #5   Title  Patient will utilize LUE to assist with bilateral cutting of food at table    Status  On-going        OT Long Term Goals - 08/25/19 1848      OT LONG TERM GOAL #1   Title  Patient will complete update HEP to address edema management, range of motion and functional use of LUE    Time  8     Period  Weeks    Status  On-going      OT LONG TERM GOAL #2   Title  Patient will demonstrate 30 lbs of grip strength to assist with functional grasp in non dominant LUE    Time  8    Period  Weeks    Status  On-going      OT LONG TERM GOAL #3   Title  Patient will oopose thumb to index, long, and ring fingertip to aide with functional pinch, picking up small objects    Time  8    Period  Weeks    Status  On-going      OT LONG TERM GOAL #4   Title  Patient will demonstrate improved ability to utilize functional pinch and manipulation to tie shoelaces    Time  8    Period  Weeks    Status  On-going      OT LONG TERM GOAL #5   Title  Patient will demonstrate adequate pinch strength to pull on socks using BUE - as evidenced by self report decreased time    Time  8    Period  Weeks    Status  On-going            Plan - 09/22/19 1815    Clinical Impression Statement  Patient with overall consistent decrease in pain, and with slight decrease in edema in palm and dorsum of hand. Patient is benefitting from OT intervention, although progress is slower than initially expected - anticipate extension of rehab program.    OT Frequency  2x / week    OT Duration  4 weeks    OT Treatment/Interventions  Self-care/ADL training;Electrical Stimulation;Therapeutic exercise;Patient/family education;Splinting;Compression bandaging;Neuromuscular education;Moist Heat;Fluidtherapy;Scar mobilization;Therapeutic activities;Passive range of motion;Manual Therapy;DME and/or AE instruction;Contrast Bath;Ultrasound    Plan  estim set up for edema management, digit range - thumb and index, functional use of left hand    Consulted and Agree with Plan of Care  Patient       Patient will benefit from skilled therapeutic intervention in order to improve the following deficits and impairments:  Visit Diagnosis: Localized edema - Plan: Ot plan of care cert/re-cert  Other disturbances of skin  sensation - Plan: Ot plan of care cert/re-cert  Stiffness of left hand, not elsewhere classified - Plan: Ot plan of care cert/re-cert  Pain in left hand - Plan: Ot plan of care cert/re-cert    Problem List Patient Active Problem List   Diagnosis Date Noted  . Statin intolerance 05/08/2019  . Obesity (BMI 30-39.9) 05/07/2019  . 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 Diverticulitis of large intestine with perforation without bleeding 11/11/2018  . Nummular eczema 03/14/2018  . Prediabetes 02/26/2017  . CKD (chronic kidney disease) stage 3, GFR 30-59 ml/min 09/07/2016  . Swelling of both hands 11/12/2014  . Vitamin B12 deficiency 01/12/2014  . Calcified granuloma of lung ([place]) 12/02/2013  . HYPERTENSION, BENIGN ESSENTIAL 06/16/2009  . Hyperlipidemia LDL goal <130 02/26/2008    Mariah Milling, OTR/L 09/22/2019, 6:25 PM  [facility] [address] [place], [place], [address] Phone: [phone]   Fax:  [phone]  Name: Javier Casey MRN: [id number] Date of Birth: June 06, 1956

## 2019-09-25 ENCOUNTER — Ambulatory Visit: Payer: BC Managed Care – PPO | Admitting: Occupational Therapy

## 2019-09-25 ENCOUNTER — Encounter: Payer: Self-pay | Admitting: Occupational Therapy

## 2019-09-25 ENCOUNTER — Other Ambulatory Visit: Payer: Self-pay

## 2019-09-25 DIAGNOSIS — R600 Localized edema: Secondary | ICD-10-CM | POA: Diagnosis not present

## 2019-09-25 DIAGNOSIS — M79642 Pain in left hand: Secondary | ICD-10-CM | POA: Diagnosis not present

## 2019-09-25 DIAGNOSIS — R209 Unspecified disturbances of skin sensation: Secondary | ICD-10-CM | POA: Diagnosis not present

## 2019-09-25 DIAGNOSIS — M25642 Stiffness of left hand, not elsewhere classified: Secondary | ICD-10-CM

## 2019-09-25 DIAGNOSIS — R208 Other disturbances of skin sensation: Secondary | ICD-10-CM

## 2019-09-25 NOTE — Therapy (Signed)
[facility] [address] [place], [place], [address] Phone: [phone]   Fax:  [phone]  Occupational Therapy Treatment  Patient Details  Name: Javier Casey MRN: [id number] Date of Birth: June 26, 1956 Referring Provider (OT): Howard Pouch   Encounter Date: 09/25/2019  OT End of Session - 09/25/19 1931    Visit Number  9    Number of Visits  20    Authorization Type  BCBS - 30 VL    OT Start Time  1700    OT Stop Time  1745    OT Time Calculation (min)  45 min    Activity Tolerance  Patient limited by pain    Behavior During Therapy  Flat affect       Past Medical History:  Diagnosis Date  . Allergy    swelling lips ,nose, hands and feet, tongue, but unknown reason  . Asthma   . Colon polyps   . 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 Diverticulitis   . Diverticulosis 2016   There was severe diverticulosis noted in the ascending to sigmoid colon  . Epididymal cyst 02/2016   Multiple on scrotal u/s  . GERD (gastroesophageal reflux disease)   . Hyperlipidemia   . Hypertension   . Hyperuricemia   . Tubular adenoma of colon 2009; 2016    Past Surgical History:  Procedure Laterality Date  . APPENDECTOMY    . colonoscopy with polypectomy  2009; 12/01/2014   [facility].  +Diverticulosis  . WISDOM TOOTH EXTRACTION      There were no vitals filed for this visit.  Subjective Assessment - 09/25/19 1927    Subjective   Patient hit his left arm on a piece of equipment at work this morning and it is swollen and tender - dorsum of forearm    Currently in Pain?  Yes    Pain Location  Arm    Pain Orientation  Left    Pain Descriptors / Indicators  Aching    Pain Type  Acute pain                   OT Treatments/Exercises (OP) - 09/25/19 0001      Ultrasound   Ultrasound Location  palm, wrist, webspace    Ultrasound Parameters  85mhz, continuous, 0.8 w/cm2, x 5 min    Ultrasound Goals   Pain;Edema      Manual Therapy   Manual therapy comments  Consulted today with a lymphedema trained PT for additional ideas regarding edema management.  Seeking MD orders for PT Lymphedema for Left hand.      Edema Management  Reviewed benefit of wearing compression glove all day long to help control amount of swelling.               OT Education - 09/25/19 1930    Education Details  Lymph system management, compression glove    Person(s) Educated  Patient    Methods  Explanation    Comprehension  Verbalized understanding       OT Short Term Goals - 09/22/19 1817      OT SHORT TERM GOAL #1   Title  Patient will complete edema management effectively throughout day to help reduce overall swelling in left hand due 3/7    Status  Achieved      OT SHORT TERM GOAL #2   Title  Patient will complete a home exercise program designed to improve range of motion in left  digits/ thumb    Status  Achieved      OT SHORT TERM GOAL #3   Title  Patient will utilize left hand to stabilize a bottle (not new) while unscrewing cap with right hand    Status  Achieved      OT SHORT TERM GOAL #4   Title  Patient will demonstrate improved range of motion in index, middle and ring - to 60 MCP flexion actively to aide with gross grasp.    Status  On-going      OT SHORT TERM GOAL #5   Title  Patient will utilize LUE to assist with bilateral cutting of food at table    Status  On-going        OT Long Term Goals - 08/25/19 1848      OT LONG TERM GOAL #1   Title  Patient will complete update HEP to address edema management, range of motion and functional use of LUE    Time  8    Period  Weeks    Status  On-going      OT LONG TERM GOAL #2   Title  Patient will demonstrate 30 lbs of grip strength to assist with functional grasp in non dominant LUE    Time  8    Period  Weeks    Status  On-going      OT LONG TERM GOAL #3   Title  Patient will oopose thumb to index, long, and ring fingertip  to aide with functional pinch, picking up small objects    Time  8    Period  Weeks    Status  On-going      OT LONG TERM GOAL #4   Title  Patient will demonstrate improved ability to utilize functional pinch and manipulation to tie shoelaces    Time  8    Period  Weeks    Status  On-going      OT LONG TERM GOAL #5   Title  Patient will demonstrate adequate pinch strength to pull on socks using BUE - as evidenced by self report decreased time    Time  8    Period  Weeks    Status  On-going            Plan - 09/25/19 1931    Clinical Impression Statement  Patient discouraged with recent injury to left arm.  Patient given information on PT - Lymphedema, hand specialist.  Sent note to patient's PCP regarding status of hand and edema management    OT Frequency  2x / week    OT Duration  4 weeks    OT Treatment/Interventions  Self-care/ADL training;Electrical Stimulation;Therapeutic exercise;Patient/family education;Splinting;Compression bandaging;Neuromuscular education;Moist Heat;Fluidtherapy;Scar mobilization;Therapeutic activities;Passive range of motion;Manual Therapy;DME and/or AE instruction;Contrast Bath;Ultrasound    Plan  range of motion thumb, edema management    Consulted and Agree with Plan of Care  Patient       Patient will benefit from skilled therapeutic intervention in order to improve the following deficits and impairments:           Visit Diagnosis: Localized edema  Other disturbances of skin sensation  Stiffness of left hand, not elsewhere classified  Pain in left hand    Problem List Patient Active Problem List   Diagnosis Date Noted  . Statin intolerance 05/08/2019  . Obesity (BMI 30-39.9) 05/07/2019  . 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 Diverticulitis of large intestine with perforation without bleeding 11/11/2018  . Nummular eczema  03/14/2018  . Prediabetes 02/26/2017  . CKD (chronic kidney disease) stage 3, GFR 30-59 ml/min 09/07/2016   . Swelling of both hands 11/12/2014  . Vitamin B12 deficiency 01/12/2014  . Calcified granuloma of lung ([place]) 12/02/2013  . HYPERTENSION, BENIGN ESSENTIAL 06/16/2009  . Hyperlipidemia LDL goal <130 02/26/2008    Mariah Milling, OTR/L 09/25/2019, 7:34 PM  [facility] [address] [place], [place], [address] Phone: [phone]   Fax:  [phone]  Name: Javier Casey MRN: [id number] Date of Birth: 1955/11/13

## 2019-09-26 ENCOUNTER — Telehealth: Payer: Self-pay | Admitting: Family Medicine

## 2019-09-26 NOTE — Telephone Encounter (Signed)
Please call patient and schedule him for follow-up concerning his hand trauma.    He has been seeing physical therapy who has communicated their concerns for his progression and are recommending lymphedema trained PT, hand specialist referral and they also report he hit his left arm at work and had recommended he be seen by his doctor.  We are going to need to be able to follow-up with him in order to evaluate his progression, concerns and appropriateness for the above referrals.  Please get him scheduled as soon as possible at his earliest convenience.

## 2019-09-26 NOTE — Telephone Encounter (Signed)
Patient scheduled 09/29/19 @ 2:30pm.

## 2019-09-26 NOTE — Telephone Encounter (Signed)
-----   Message from Mariah Milling, [place] sent at 09/25/2019  7:17 PM EST ----- Dr Raoul Pitch,  I am still seeing Javier Casey.   1)  He came in today after hitting his left arm at work.   It is very tender, and I advised him to have it checked out by his doctor.     2) I also called in a lymphedema trained PT tin as a consult o see if there were some more options that may help control his edema in his hand.  We have been making progress - pain is better, he has some more range of motion in his fingers, he is using his hand a little bit - but our progress has been slow.  I am convinced that getting his swelling under control is now our number one issue.   - Would you be agreeable to writing an order for PT for lymphedema?  We have a clinic at [facility] that would be happy to see him 2-3 visits and set up a program for lymph management, wrapping, and fit him for a compression glove more suitable to his level of brawny edema, than what is available here.    3)  I also spoke to him about seeing a hand specialist.  I was hoping we would be a bit further in his recovery, and I just want to make sure I am covering all the bases for him.  I gave him Dr Shanon Brow Thompson's contact information.  He wanted to make sure you were aware.    4)  Finally, I asked if he thought it may be helpful for him to seek counseling, and he is getting a bit discouraged with this whole process.  He did not feel it necessary at this time, but I wanted you to know we spoke about it.    Thanks for everything you do for him.   Please let me know if you have questions or concerns.   Antony Salmon, OTR/L  09/25/19  7:26 PM Phone: [phone] Fax: [phone]

## 2019-09-29 ENCOUNTER — Encounter: Payer: Self-pay | Admitting: Occupational Therapy

## 2019-09-29 ENCOUNTER — Ambulatory Visit (INDEPENDENT_AMBULATORY_CARE_PROVIDER_SITE_OTHER): Payer: BC Managed Care – PPO | Admitting: Family Medicine

## 2019-09-29 ENCOUNTER — Other Ambulatory Visit: Payer: Self-pay

## 2019-09-29 ENCOUNTER — Ambulatory Visit: Payer: BC Managed Care – PPO | Admitting: Occupational Therapy

## 2019-09-29 ENCOUNTER — Encounter: Payer: Self-pay | Admitting: Family Medicine

## 2019-09-29 VITALS — BP 142/80 | HR 62 | Temp 97.7°F | Resp 16 | Ht 73.0 in | Wt 244.4 lb

## 2019-09-29 DIAGNOSIS — M25641 Stiffness of right hand, not elsewhere classified: Secondary | ICD-10-CM

## 2019-09-29 DIAGNOSIS — S6992XS Unspecified injury of left wrist, hand and finger(s), sequela: Secondary | ICD-10-CM | POA: Diagnosis not present

## 2019-09-29 DIAGNOSIS — R208 Other disturbances of skin sensation: Secondary | ICD-10-CM

## 2019-09-29 DIAGNOSIS — R209 Unspecified disturbances of skin sensation: Secondary | ICD-10-CM | POA: Diagnosis not present

## 2019-09-29 DIAGNOSIS — M79642 Pain in left hand: Secondary | ICD-10-CM

## 2019-09-29 DIAGNOSIS — R600 Localized edema: Secondary | ICD-10-CM | POA: Diagnosis not present

## 2019-09-29 DIAGNOSIS — M25642 Stiffness of left hand, not elsewhere classified: Secondary | ICD-10-CM | POA: Diagnosis not present

## 2019-09-29 HISTORY — DX: Unspecified injury of left wrist, hand and finger(s), sequela: S69.92XS

## 2019-09-29 HISTORY — DX: Stiffness of right hand, not elsewhere classified: M25.641

## 2019-09-29 NOTE — Progress Notes (Signed)
This visit occurred during the SARS-CoV-2 public health emergency.  Safety protocols were in place, including screening questions prior to the visit, additional usage of staff PPE, and extensive cleaning of exam room while observing appropriate contact time as indicated for disinfecting solutions.    Javier Casey , 1955-09-18, 64 y.o., male MRN: [id number] Patient Care Team    Relationship Specialty Notifications Start End  Ma Hillock, DO PCP - General Family Medicine  08/13/15   Pyrtle, Lajuan Lines, MD Consulting Physician Gastroenterology  02/18/16     Chief Complaint  Patient presents with  . Follow-up    Pt continues to go to therapy. It is going well but it is not improving like he would like.      Subjective: Pt presents for an OV to discuss his decreased ROM of hand despite PT. He reports the sharp electric like pain he was having has resolved. His hand swelling has decreased, but still mild swelling remains. He has been working with PT since his accident mid-Jan and he reports his hand and ROM has greatly improved- but he is still has moderate to severe  Decreased ROM. He reports tingling in his index and thumb that is constant.    Prior note: OV to have sutures removed after sustaining a laceration/trauma to his left hand on August 04, 2019. He reports it is improving. The swelling has improved- still present. He has numbness of the hand. He is using the silvadene on the separate burn of index finger.   Prior pt:   Patient presented to outside emergency room and 6 cm laceration was repaired with interrupted sutures.  He also was provided with a tetanus vaccination.  Patient reports overall he is improving greatly.  He reports the swelling in his palm was rather significant and has gone down a great deal, but is still swollen.  He denies any redness.  He states there is numbness throughout his hand and fingers.  It has started to improve over his fourth and fifth digits. He  reports he burned his index finger with steam because he could not feel the heat coming off of a boiling pot.  Depression screen [facility] 2/9 05/07/2019 03/01/2018 02/23/2017 02/18/2016  Decreased Interest 0 0 0 0  Down, Depressed, Hopeless 0 0 0 0  PHQ - 2 Score 0 0 0 0    Allergies  Allergen Reactions  . Ciprofloxacin     Severe swelling and rash  . Levofloxacin     Diffuse swelling with angioedema  . Pravastatin Other (See Comments)    myalgia  . Lipitor [Atorvastatin] Other (See Comments)    Myalgia    Social History   Social History Narrative  . Not on file   Past Medical History:  Diagnosis Date  . Allergy    swelling lips ,nose, hands and feet, tongue, but unknown reason  . Asthma   . Colon polyps   . 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 Diverticulitis   . Diverticulosis 2016   There was severe diverticulosis noted in the ascending to sigmoid colon  . Epididymal cyst 02/2016   Multiple on scrotal u/s  . GERD (gastroesophageal reflux disease)   . Hyperlipidemia   . Hypertension   . Hyperuricemia   . Tubular adenoma of colon 2009; 2016   Past Surgical History:  Procedure Laterality Date  . APPENDECTOMY    . colonoscopy with polypectomy  2009; 12/01/2014   [facility].  +Diverticulosis  .  WISDOM TOOTH EXTRACTION     Family History  Problem Relation Age of Onset  . Coronary artery disease Mother 63       died post CBAG  . Allergic Disorder Daughter   . Allergic Disorder Son   . Cancer Neg Hx   . Stroke Neg Hx   . Diabetes Neg Hx   . Colon cancer Neg Hx   . Colon polyps Neg Hx   . Esophageal cancer Neg Hx   . Stomach cancer Neg Hx   . Pancreatic cancer Neg Hx   . Liver disease Neg Hx   . Inflammatory bowel disease Neg Hx    Allergies as of 09/29/2019      Reactions   Ciprofloxacin    Severe swelling and rash   Levofloxacin    Diffuse swelling with angioedema   Pravastatin Other (See Comments)   myalgia   Lipitor [atorvastatin] Other (See Comments)    Myalgia       Medication List       Accurate as of September 29, 2019  2:34 PM. If you have any questions, ask your nurse or doctor.        STOP taking these medications   sulfamethoxazole-trimethoprim 400-80 MG tablet Commonly known as: Bactrim Stopped by: Howard Pouch, DO     TAKE these medications   B-12 2500 MCG Tabs Take by mouth daily.   ezetimibe 10 MG tablet Commonly known as: Zetia Take 1 tablet (10 mg total) by mouth daily.   losartan-hydrochlorothiazide 50-12.5 MG tablet Commonly known as: HYZAAR Take 1 tablet by mouth daily.   OMEGA-3 2100 PO Take by mouth.   silver sulfADIAZINE 1 % cream Commonly known as: Silvadene Apply 1 application topically daily.   Vitamin D3 250 MCG (10000 UT) capsule Take 10,000 Units by mouth daily.       All past medical history, surgical history, allergies, family history, immunizations andmedications were updated in the EMR today and reviewed under the history and medication portions of their EMR.     ROS: Negative, with the exception of above mentioned in HPI   Objective:  BP (!) 142/80 (BP Location: Left Arm, Patient Position: Sitting, Cuff Size: Normal)   Pulse 62   Temp 97.7 F (36.5 C) (Temporal)   Resp 16   Ht 6\' 1"  (1.854 m)   Wt 244 lb 6 oz (110.8 kg)   SpO2 99%   BMI 32.24 kg/m  Body mass index is 32.24 kg/m. Gen: Afebrile. No acute distress.  HENT: AT. [place].  Eyes:Pupils Equal Round Reactive to light, Extraocular movements intact,  Conjunctiva without redness, discharge or icterus. MSK: Left hand without erythema.  Skin intact.  Mild swelling remains surrounding dorsal hand and wrist.  Hard nonmobile mass center of palm where prior hematoma formation had been.  Tingling sensation distal thumb index and middle finger.  Positive Tinel's at wrist.  Severely decreased range of motion finger to thumb and grasp. Skin: no rashes, purpura or petechiae.  Neuro:  Normal gait. PERLA. EOMi. Alert. Oriented x3  Psych:  Normal affect, dress and demeanor. Normal speech. Normal thought content and judgment.  No exam data present No results found. No results found for this or any previous visit (from the past 24 hour(s)).  Assessment/Plan: TRAFTON BIGGIO is a 64 y.o. male present for OV for  Hand trauma, left/Hematoma/wound check - original injury mid- January>> Pt had originally been seen at an UC>>has been working with PT with some improvement>> however  he is still rather limited in ROM of fingers and thumb. Original area of hematoma mid palm now feels possibly calcified and may be stopping him from further improvement in movement, with tendon and nerve entrapment.  Will obtain xray today of wrist and hand>>may need MRI vs referral to hand surgeon (Pt desires Dr. Milly Jakob).  - will await xrays before referral to hand surgeon or PT for lymphedema. Likely will require both.    Reviewed expectations re: course of current medical issues.  Discussed self-management of symptoms.  Outlined signs and symptoms indicating need for more acute intervention.  Patient verbalized understanding and all questions were answered.  Patient received an After-Visit Summary.    No orders of the defined types were placed in this encounter. No orders of the defined types were placed in this encounter.  Referral Orders  No referral(s) requested today     Note is dictated utilizing voice recognition software. Although note has been proof read prior to signing, occasional typographical errors still can be missed. If any questions arise, please do not hesitate to call for verification.   electronically signed by:  Howard Pouch, DO  [place]

## 2019-09-29 NOTE — Therapy (Signed)
[facility] [address] [place], [place], [address] Phone: [phone]   Fax:  [phone]  Occupational Therapy Treatment  Patient Details  Name: Javier Casey MRN: [id number] Date of Birth: 11/01/1955 Referring Provider (OT): Howard Pouch   Encounter Date: 09/29/2019  OT End of Session - 09/29/19 1858    Visit Number  10    Number of Visits  20    Authorization Type  BCBS - 30 VL    OT Start Time  [id number]    OT Stop Time  1745    OT Time Calculation (min)  40 min    Activity Tolerance  Patient tolerated treatment well    Behavior During Therapy  [facility] for tasks assessed/performed       Past Medical History:  Diagnosis Date  . Allergy    swelling lips ,nose, hands and feet, tongue, but unknown reason  . Asthma   . Colon polyps   . 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 Diverticulitis   . Diverticulosis 2016   There was severe diverticulosis noted in the ascending to sigmoid colon  . Epididymal cyst 02/2016   Multiple on scrotal u/s  . GERD (gastroesophageal reflux disease)   . Hyperlipidemia   . Hypertension   . Hyperuricemia   . Tubular adenoma of colon 2009; 2016    Past Surgical History:  Procedure Laterality Date  . APPENDECTOMY    . colonoscopy with polypectomy  2009; 12/01/2014   [facility].  +Diverticulosis  . WISDOM TOOTH EXTRACTION      There were no vitals filed for this visit.  Subjective Assessment - 09/29/19 1850    Subjective   Patient indicated he worked outside in his yard all weekend.  He was able to work in his shop as well.  Patient with improved spirit today.  Saw PCP earlier this afternoon.    Currently in Pain?  No/denies    Pain Score  0-No pain                   OT Treatments/Exercises (OP) - 09/29/19 0001      ADLs   ADL Comments  Patient saw PCP today to discuss progress in left hand.  Patient to receive an xray tomorrow to determine next course of action.   PCP wants to have xray information before deciding to refer to lymphedema specialist, or hand surgeon.  Hopefully patient will have results of xray at next visit.        Hand Exercises   Other Hand Exercises  Worked on thumb mobilization passive to active to improve opposition and flex/abd.  Patient with improved passive range and now able to oppose 5th digit, and almost make contact with index finger at PIP following intervention      Ultrasound   Ultrasound Location  palm, wrist, webspace, scar    Ultrasound Parameters  17mHz, continuous, 8 min, 0.8 w/cm2.      Ultrasound Goals  Pain;Other (Comment)   tissue management     Manual Therapy   Manual Therapy  Joint mobilization;Soft tissue mobilization;Edema management    Edema Management  Patient with new compression glove which he wore to work all day.  Improved swelling on dorsum of hand.      Joint Mobilization  Metacarpals, thumb , arches of hand    Soft tissue mobilization  palm, web space             OT Education - 09/29/19  [place]    Education Details  discussed next steps - patient wants to continue for the short term, but also wants to reserve therapy time for additional needs, e.g. if he has further surgery    Person(s) Educated  Patient    Methods  Explanation    Comprehension  Verbalized understanding       OT Short Term Goals - 09/29/19 1859      OT SHORT TERM GOAL #1   Title  Patient will complete edema management effectively throughout day to help reduce overall swelling in left hand due 3/7    Status  Achieved      OT SHORT TERM GOAL #2   Status  Achieved      OT SHORT TERM GOAL #3   Title  Patient will utilize left hand to stabilize a bottle (not new) while unscrewing cap with right hand    Status  Achieved      OT SHORT TERM GOAL #4   Title  Patient will demonstrate improved range of motion in index, middle and ring - to 60 MCP flexion actively to aide with gross grasp.    Status  On-going      OT SHORT  TERM GOAL #5   Title  Patient will utilize LUE to assist with bilateral cutting of food at table    Status  On-going        OT Long Term Goals - 08/25/19 1848      OT LONG TERM GOAL #1   Title  Patient will complete update HEP to address edema management, range of motion and functional use of LUE    Time  8    Period  Weeks    Status  On-going      OT LONG TERM GOAL #2   Title  Patient will demonstrate 30 lbs of grip strength to assist with functional grasp in non dominant LUE    Time  8    Period  Weeks    Status  On-going      OT LONG TERM GOAL #3   Title  Patient will oopose thumb to index, long, and ring fingertip to aide with functional pinch, picking up small objects    Time  8    Period  Weeks    Status  On-going      OT LONG TERM GOAL #4   Title  Patient will demonstrate improved ability to utilize functional pinch and manipulation to tie shoelaces    Time  8    Period  Weeks    Status  On-going      OT LONG TERM GOAL #5   Title  Patient will demonstrate adequate pinch strength to pull on socks using BUE - as evidenced by self report decreased time    Time  8    Period  Weeks    Status  On-going            Plan - 09/29/19 1858    Clinical Impression Statement  Patient in beter spirits today than last week.  He was active all weekend, and used his left hand functionally to do tasks he enjoyed.  Patient followed up with his promary MD today and will have xray of his left hand tomorrow to determine next steps.    OT Frequency  2x / week    OT Duration  4 weeks    OT Treatment/Interventions  Self-care/ADL training;Electrical Stimulation;Therapeutic exercise;Patient/family education;Splinting;Compression bandaging;Neuromuscular education;Moist Heat;Fluidtherapy;Scar mobilization;Therapeutic activities;Passive range of  motion;Manual Therapy;DME and/or AE instruction;Contrast Bath;Ultrasound    Plan  range of motion thumb, edema management    Consulted and Agree  with Plan of Care  Patient       Patient will benefit from skilled therapeutic intervention in order to improve the following deficits and impairments:           Visit Diagnosis: Localized edema  Other disturbances of skin sensation  Stiffness of left hand, not elsewhere classified  Pain in left hand    Problem List Patient Active Problem List   Diagnosis Date Noted  . Decreased range of motion of finger of right hand 09/29/2019  . Hand trauma, left, sequela 09/29/2019  . Statin intolerance 05/08/2019  . Obesity (BMI 30-39.9) 05/07/2019  . 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 Diverticulitis of large intestine with perforation without bleeding 11/11/2018  . Nummular eczema 03/14/2018  . Prediabetes 02/26/2017  . CKD (chronic kidney disease) stage 3, GFR 30-59 ml/min 09/07/2016  . Swelling of both hands 11/12/2014  . Vitamin B12 deficiency 01/12/2014  . Calcified granuloma of lung ([employer]) 12/02/2013  . HYPERTENSION, BENIGN ESSENTIAL 06/16/2009  . Hyperlipidemia LDL goal <130 02/26/2008    Mariah Milling, OTR/L 09/29/2019, 7:01 PM  [facility] [address] [place], [place], [address] Phone: [phone]   Fax:  [phone]  Name: FIELDS KAMMERZELL MRN: [id number] Date of Birth: [birth year]

## 2019-09-29 NOTE — Patient Instructions (Signed)
[facility] [address] [place], [place] [address] Phone: [phone]

## 2019-09-30 ENCOUNTER — Telehealth: Payer: Self-pay | Admitting: Family Medicine

## 2019-09-30 ENCOUNTER — Ambulatory Visit (INDEPENDENT_AMBULATORY_CARE_PROVIDER_SITE_OTHER): Payer: BC Managed Care – PPO

## 2019-09-30 DIAGNOSIS — M25641 Stiffness of right hand, not elsewhere classified: Secondary | ICD-10-CM

## 2019-09-30 DIAGNOSIS — S6992XS Unspecified injury of left wrist, hand and finger(s), sequela: Secondary | ICD-10-CM

## 2019-09-30 DIAGNOSIS — S62301A Unspecified fracture of second metacarpal bone, left hand, initial encounter for closed fracture: Secondary | ICD-10-CM | POA: Diagnosis not present

## 2019-09-30 DIAGNOSIS — S62202A Unspecified fracture of first metacarpal bone, left hand, initial encounter for closed fracture: Secondary | ICD-10-CM | POA: Diagnosis not present

## 2019-09-30 NOTE — Telephone Encounter (Signed)
Pt was called and given results/instructions, he verbalized understanding

## 2019-09-30 NOTE — Telephone Encounter (Signed)
Please inform patient his hand and wrist x-rays noted with what appeared to be either fracture fragments/bone fragments versus foreign body debris between the thumb and the index fingers otherwise there is no other abnormality visualized by at the x-ray.  X-rays will not show the soft tissue masses which is what I suspect may be in the palm of the hand.  Therefore I have went ahead and ordered an MRI of his hand which they will call to schedule for him as well as referred him to the hand surgeon we spoke of.  I am hoping he will have his MRI results prior to his specialty appointment so that he can be a step ahead in the process.

## 2019-10-01 ENCOUNTER — Encounter: Payer: Self-pay | Admitting: Occupational Therapy

## 2019-10-01 NOTE — Therapy (Signed)
[facility] [address] [place], [place], [address] Phone: [phone]   Fax:  [phone]  Occupational Therapy Note  Patient Details  Name: Javier Casey MRN: [id number] Date of Birth: 18-May-1956 Referring Provider (OT): Howard Pouch   Encounter Date: 10/01/2019    Past Medical History:  Diagnosis Date  . Allergy    swelling lips ,nose, hands and feet, tongue, but unknown reason  . Asthma   . Colon polyps   . 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 Diverticulitis   . Diverticulosis 2016   There was severe diverticulosis noted in the ascending to sigmoid colon  . Epididymal cyst 02/2016   Multiple on scrotal u/s  . GERD (gastroesophageal reflux disease)   . Hyperlipidemia   . Hypertension   . Hyperuricemia   . Tubular adenoma of colon 2009; 2016    Past Surgical History:  Procedure Laterality Date  . APPENDECTOMY    . colonoscopy with polypectomy  2009; 12/01/2014   [facility].  +Diverticulosis  . WISDOM TOOTH EXTRACTION      There were no vitals filed for this visit.                          OT Short Term Goals - 09/29/19 1859      OT SHORT TERM GOAL #1   Title  Patient will complete edema management effectively throughout day to help reduce overall swelling in left hand due 3/7    Status  Achieved      OT SHORT TERM GOAL #2   Status  Achieved      OT SHORT TERM GOAL #3   Title  Patient will utilize left hand to stabilize a bottle (not new) while unscrewing cap with right hand    Status  Achieved      OT SHORT TERM GOAL #4   Title  Patient will demonstrate improved range of motion in index, middle and ring - to 60 MCP flexion actively to aide with gross grasp.    Status  On-going      OT SHORT TERM GOAL #5   Title  Patient will utilize LUE to assist with bilateral cutting of food at table    Status  On-going        OT Long Term Goals - 08/25/19 1848      OT  LONG TERM GOAL #1   Title  Patient will complete update HEP to address edema management, range of motion and functional use of LUE    Time  8    Period  Weeks    Status  On-going      OT LONG TERM GOAL #2   Title  Patient will demonstrate 30 lbs of grip strength to assist with functional grasp in non dominant LUE    Time  8    Period  Weeks    Status  On-going      OT LONG TERM GOAL #3   Title  Patient will oopose thumb to index, long, and ring fingertip to aide with functional pinch, picking up small objects    Time  8    Period  Weeks    Status  On-going      OT LONG TERM GOAL #4   Title  Patient will demonstrate improved ability to utilize functional pinch and manipulation to tie shoelaces    Time  8    Period  Weeks  Status  On-going      OT LONG TERM GOAL #5   Title  Patient will demonstrate adequate pinch strength to pull on socks using BUE - as evidenced by self report decreased time    Time  8    Period  Weeks    Status  On-going              Patient will benefit from skilled therapeutic intervention in order to improve the following deficits and impairments:           Visit Diagnosis: No diagnosis found.    Problem List Patient Active Problem List   Diagnosis Date Noted  . Decreased range of motion of finger of right hand 09/29/2019  . Hand trauma, left, sequela 09/29/2019  . Statin intolerance 05/08/2019  . Obesity (BMI 30-39.9) 05/07/2019  . 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 Diverticulitis of large intestine with perforation without bleeding 11/11/2018  . Nummular eczema 03/14/2018  . Prediabetes 02/26/2017  . CKD (chronic kidney disease) stage 3, GFR 30-59 ml/min 09/07/2016  . Swelling of both hands 11/12/2014  . Vitamin B12 deficiency 01/12/2014  . Calcified granuloma of lung ([facility]) 12/02/2013  . HYPERTENSION, BENIGN ESSENTIAL 06/16/2009  . Hyperlipidemia LDL goal <130 02/26/2008  OCCUPATIONAL THERAPY DISCHARGE SUMMARY  Visits from  Start of Care: 10  Current functional level related to goals / functional outcomes: Improved pain and sensation in left hand.  Improved range of motion in digits   Remaining deficits: Brawny edema in digits, palm, decreased range of motion index and thumb, decreased functional use of nondominant LUE  Education / Equipment: HEP, Compression glove, edema management   Patient called to cancel remaining appointments.  Xray revealed bone fragments or debris in hand between thumb and index finger.  Patient reports having a procedure done and will obtain new orders.    Plan: Patient agrees to discharge.  Patient goals were not met. Patient is being discharged due to a change in medical status.  ?????     Mariah Milling 10/01/2019, 5:35 PM  [facility] [address] [place], [place], [address] Phone: [phone]   Fax:  [phone]  Name: Javier Casey MRN: [id number] Date of Birth: [birth year]

## 2019-10-03 ENCOUNTER — Encounter: Payer: Self-pay | Admitting: Family Medicine

## 2019-10-06 ENCOUNTER — Encounter: Payer: BC Managed Care – PPO | Admitting: Occupational Therapy

## 2019-10-07 DIAGNOSIS — R200 Anesthesia of skin: Secondary | ICD-10-CM | POA: Diagnosis not present

## 2019-10-07 DIAGNOSIS — M25642 Stiffness of left hand, not elsewhere classified: Secondary | ICD-10-CM | POA: Diagnosis not present

## 2019-10-12 ENCOUNTER — Other Ambulatory Visit: Payer: BC Managed Care – PPO

## 2019-10-13 ENCOUNTER — Encounter: Payer: BC Managed Care – PPO | Admitting: Occupational Therapy

## 2019-10-14 DIAGNOSIS — S6412XD Injury of median nerve at wrist and hand level of left arm, subsequent encounter: Secondary | ICD-10-CM | POA: Diagnosis not present

## 2019-10-14 DIAGNOSIS — R600 Localized edema: Secondary | ICD-10-CM | POA: Diagnosis not present

## 2019-10-14 DIAGNOSIS — M25642 Stiffness of left hand, not elsewhere classified: Secondary | ICD-10-CM | POA: Diagnosis not present

## 2019-10-16 ENCOUNTER — Encounter: Payer: BC Managed Care – PPO | Admitting: Occupational Therapy

## 2019-10-19 ENCOUNTER — Other Ambulatory Visit: Payer: Self-pay

## 2019-10-19 ENCOUNTER — Ambulatory Visit (INDEPENDENT_AMBULATORY_CARE_PROVIDER_SITE_OTHER): Payer: BC Managed Care – PPO

## 2019-10-19 DIAGNOSIS — M25641 Stiffness of right hand, not elsewhere classified: Secondary | ICD-10-CM | POA: Diagnosis not present

## 2019-10-19 DIAGNOSIS — R600 Localized edema: Secondary | ICD-10-CM | POA: Diagnosis not present

## 2019-10-19 DIAGNOSIS — S6992XS Unspecified injury of left wrist, hand and finger(s), sequela: Secondary | ICD-10-CM | POA: Diagnosis not present

## 2019-10-19 DIAGNOSIS — M7989 Other specified soft tissue disorders: Secondary | ICD-10-CM | POA: Diagnosis not present

## 2019-10-20 ENCOUNTER — Encounter: Payer: BC Managed Care – PPO | Admitting: Occupational Therapy

## 2019-10-20 DIAGNOSIS — R600 Localized edema: Secondary | ICD-10-CM | POA: Diagnosis not present

## 2019-10-20 DIAGNOSIS — M25642 Stiffness of left hand, not elsewhere classified: Secondary | ICD-10-CM | POA: Diagnosis not present

## 2019-10-20 DIAGNOSIS — S6412XD Injury of median nerve at wrist and hand level of left arm, subsequent encounter: Secondary | ICD-10-CM | POA: Diagnosis not present

## 2019-10-21 ENCOUNTER — Telehealth: Payer: Self-pay | Admitting: Family Medicine

## 2019-10-21 NOTE — Telephone Encounter (Signed)
Please inform patient the following information: MRI of his hand showed "significant edema "which appeared to be inflammatory or infectious.  There was no hematoma noted.  Again noted the small calcification/bone fragments at the base of his thumb (same as his x-ray).  Please ask him when his next appointment is with his hand surgeon?  I am hesitant to initiate any type of treatment for inflammatory infectious causes depending upon her plan.  Last he was seen here he did not appear infected and I would have assumed if he appeared infected at his hand surgeon's office they would have initiated treatment.    I will forward the MRI result to Dr. Grandville Silos.

## 2019-10-21 NOTE — Telephone Encounter (Signed)
Nerve mapping test tomorrow with the Copy. Hand surgeon is preparing for surgery on patient but needs to know how bad the nerve damage is first. Pt will speak with them tomorrow about the MRI and makes sure Dr Grandville Silos sees the report.

## 2019-10-22 ENCOUNTER — Other Ambulatory Visit: Payer: Self-pay

## 2019-10-22 ENCOUNTER — Other Ambulatory Visit: Payer: Self-pay | Admitting: Orthopedic Surgery

## 2019-10-22 ENCOUNTER — Encounter (HOSPITAL_BASED_OUTPATIENT_CLINIC_OR_DEPARTMENT_OTHER): Payer: Self-pay | Admitting: Orthopedic Surgery

## 2019-10-22 DIAGNOSIS — G5612 Other lesions of median nerve, left upper limb: Secondary | ICD-10-CM | POA: Diagnosis not present

## 2019-10-23 ENCOUNTER — Other Ambulatory Visit (HOSPITAL_COMMUNITY)
Admission: RE | Admit: 2019-10-23 | Discharge: 2019-10-23 | Disposition: A | Discharge status: Home / Self Care | Payer: BC Managed Care – PPO | Source: Ambulatory Visit | Attending: Orthopedic Surgery | Admitting: Orthopedic Surgery

## 2019-10-23 ENCOUNTER — Encounter: Payer: BC Managed Care – PPO | Admitting: Occupational Therapy

## 2019-10-23 ENCOUNTER — Encounter (HOSPITAL_BASED_OUTPATIENT_CLINIC_OR_DEPARTMENT_OTHER)
Admission: RE | Admit: 2019-10-23 | Discharge: 2019-10-23 | Disposition: A | Discharge status: Home / Self Care | Payer: BC Managed Care – PPO | Source: Ambulatory Visit | Attending: Orthopedic Surgery | Admitting: Orthopedic Surgery

## 2019-10-23 DIAGNOSIS — Z20822 Contact with and (suspected) exposure to covid-19: Secondary | ICD-10-CM | POA: Diagnosis not present

## 2019-10-23 DIAGNOSIS — Z01818 Encounter for other preprocedural examination: Secondary | ICD-10-CM | POA: Insufficient documentation

## 2019-10-23 LAB — BASIC METABOLIC PANEL
Anion gap: 12 (ref 5–15)
BUN: 22 mg/dL (ref 8–23)
CO2: 26 mmol/L (ref 22–32)
Calcium: 9.8 mg/dL (ref 8.9–10.3)
Chloride: 104 mmol/L (ref 98–111)
Creatinine, Ser: 1.39 mg/dL — ABNORMAL HIGH (ref 0.61–1.24)
GFR calc Af Amer: >60 mL/min (ref >60)
GFR calc non Af Amer: 53 mL/min — ABNORMAL LOW (ref >60)
Glucose, Bld: 98 mg/dL (ref 70–99)
Potassium: 4.6 mmol/L (ref 3.5–5.1)
Sodium: 142 mmol/L (ref 135–145)

## 2019-10-23 LAB — SARS CORONAVIRUS 2 (TAT 6-24 HRS): SARS Coronavirus 2: NEGATIVE

## 2019-10-23 NOTE — H&P (Signed)
Javier Casey is an 64 y.o. male.   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  He reports that he was trimming a countertop in the basement, with an electric router, when it slipped and injured his left hand.  Injury x-rays reviewed reveal a laceration on the dorsal radial aspect of the first webspace.  He was provided emergency care at the emergency department in [place], [place].  Apparently he developed a hematoma in the palm of the hand and fairly significant hand edema.  Accompanying this, he has had numbness so pronounced especially in the index and long fingers that he also sustained a thermal burn to the index finger from steam, subsequent to the original injury.  He has been engaged in some hand therapy, working on edema reduction and range of motion improvement.  He reports that he had a lot of intense nerve pain, and that this has settled down over the past couple weeks, leaving him still with quite a dysfunctional hand, remaining swollen, lacking good motion, and with dense neuropathic changes.  Recent hand radiographs revealed some small scattered bone fragments adjacent to the base of the first and second metacarpals.  Past Medical History:  Diagnosis Date  . Allergy    swelling lips ,nose, hands and feet, tongue, but unknown reason  . Asthma   . Colon polyps   . 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 Diverticulitis   . Diverticulosis 2016   There was severe diverticulosis noted in the ascending to sigmoid colon  . Epididymal cyst 02/2016   Multiple on scrotal u/s  . GERD (gastroesophageal reflux disease)   . Hyperlipidemia   . Hypertension   . Hyperuricemia   . Tubular adenoma of colon 2009; 2016    Past Surgical History:  Procedure Laterality Date  . APPENDECTOMY    . colonoscopy with polypectomy  2009; 12/01/2014   [facility].  +Diverticulosis  . WISDOM TOOTH EXTRACTION       Family History  Problem Relation Age of Onset  . Coronary artery disease Mother 88       died post CBAG  . Allergic Disorder Daughter   . Allergic Disorder Son   . Cancer Neg Hx   . Stroke Neg Hx   . Diabetes Neg Hx   . Colon cancer Neg Hx   . Colon polyps Neg Hx   . Esophageal cancer Neg Hx   . Stomach cancer Neg Hx   . Pancreatic cancer Neg Hx   . Liver disease Neg Hx   . Inflammatory bowel disease Neg Hx    Social History:  reports that he has never smoked. He has never used smokeless tobacco. He reports previous alcohol use. He reports that he does not use drugs.  Allergies:  Allergies  Allergen Reactions  . Ciprofloxacin     Severe swelling and rash  . Levofloxacin     Diffuse swelling with angioedema  . Pravastatin Other (See Comments)    myalgia  . Lipitor [Atorvastatin] Other (See Comments)    Myalgia     No medications prior to admission.    No results found for this or any previous visit (from the past 48 hour(s)). No results found.  Review of Systems  All other systems reviewed and are negative.   Height 6\' 3"  (1.905 m), weight 111.1 kg. Physical Exam  Constitutional:  WD, WN, NAD HEENT:  NCAT, EOMI Neuro/Psych:  Alert & oriented to person, place, and time; appropriate mood & affect Lymphatic: No generalized UE edema or lymphadenopathy Extremities / MSK:  Both UE are normal with respect to appearance, ranges of motion, joint stability, muscle strength/tone, sensation, & perfusion except as otherwise noted:  The left hand has a slightly mottled red and the appearance from the wrist creases distally on the palm and even the dorsum.  There is a healed mostly linear longitudinal scar in the dorsal aspect of the first webspace.  There are a couple oval healing thermal burns on the index finger, one dorsal radially in the region of P2, the other volar radial at the level of the proximal digital crease.  There is also visible abnormality of the index nail,  which he reports was a consequence of the thermal burn as well This limits abduction of the first metacarpal, measured to be only 35 of palmar abduction.  In addition, the MPs rest with slight flexion contractures, further flexion to 45, PIPs to 70.  MP flexes to 50, IP to 45.  Wrist extension 50, flexion 60.  He has markedly diminished light touch sensibility in all the digits except the small finger.  Monofilaments are similarly poor, measuring 2.83 in the small finger, but then 4.31 ring, unable to detect 6.65 long and index, and 4.56 thumb. There is a exquisite Tinel sign over the median nerve in the distal forearm proximal to the distal wrist crease.  It appears that he is very slightly able to activate the thenar muscles, with some slight twitching observable, but unable to lift the thumb towards palmar abduction FPL, FDP, and all FDS appear to be intact, with questionable findings related to FDS to the index  Labs / Xrays:  No radiographic studies obtained today.  NCS/EMG c/w severe median neuropathy at/distal to CT  Assessment: 1.  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  Indeed the palmar hematoma can cause median neuropathy due to the direct pressure effect in the mid and proximal palm is in inside the carpal tunnel, but due to the sharp penetrating nature of the router injury and the dorsal first web space, it is possible that branches of the median nerve were directly impacted by the router bit itself, contributing to the more dense findings for the index and long finger, depending upon the trajectory and path of the router bit. 2.  Left hand stiffness and mottling, likely with some degree of causalgia from the nerve injury 3.  Healing multiple thermal burns to the left index 4.  Possible direct injury to the left index FDS 5.  Healing skeletal injury to the first and second metacarpals, with some scattered residual fragmentation  Plan:  To OR  for median nerve exploration and neuroplasty, with possible nerve repair/allografting, and exploration of IF FDS. G/R/O reviewed and consent obtained.  Jolyn Nap, MD 10/23/2019, 1:17 PM

## 2019-10-27 ENCOUNTER — Ambulatory Visit (HOSPITAL_BASED_OUTPATIENT_CLINIC_OR_DEPARTMENT_OTHER): Payer: BC Managed Care – PPO | Admitting: Anesthesiology

## 2019-10-27 ENCOUNTER — Encounter (HOSPITAL_BASED_OUTPATIENT_CLINIC_OR_DEPARTMENT_OTHER): Payer: Self-pay | Admitting: Orthopedic Surgery

## 2019-10-27 ENCOUNTER — Encounter (HOSPITAL_BASED_OUTPATIENT_CLINIC_OR_DEPARTMENT_OTHER)
Admission: RE | Disposition: A | Discharge status: Home / Self Care | Payer: Self-pay | Source: Home / Self Care | Attending: Orthopedic Surgery

## 2019-10-27 ENCOUNTER — Ambulatory Visit (HOSPITAL_BASED_OUTPATIENT_CLINIC_OR_DEPARTMENT_OTHER)
Admission: RE | Admit: 2019-10-27 | Discharge: 2019-10-27 | Disposition: A | Discharge status: Home / Self Care | Payer: BC Managed Care – PPO | Attending: Orthopedic Surgery | Admitting: Orthopedic Surgery

## 2019-10-27 ENCOUNTER — Encounter: Payer: BC Managed Care – PPO | Admitting: Occupational Therapy

## 2019-10-27 DIAGNOSIS — S6412XA Injury of median nerve at wrist and hand level of left arm, initial encounter: Secondary | ICD-10-CM | POA: Insufficient documentation

## 2019-10-27 DIAGNOSIS — Y9389 Activity, other specified: Secondary | ICD-10-CM | POA: Insufficient documentation

## 2019-10-27 DIAGNOSIS — G5602 Carpal tunnel syndrome, left upper limb: Secondary | ICD-10-CM | POA: Diagnosis not present

## 2019-10-27 DIAGNOSIS — X131XXD Other contact with steam and other hot vapors, subsequent encounter: Secondary | ICD-10-CM | POA: Insufficient documentation

## 2019-10-27 DIAGNOSIS — T23022D Burn of unspecified degree of single left finger (nail) except thumb, subsequent encounter: Secondary | ICD-10-CM | POA: Diagnosis not present

## 2019-10-27 DIAGNOSIS — S6412XS Injury of median nerve at wrist and hand level of left arm, sequela: Secondary | ICD-10-CM | POA: Diagnosis not present

## 2019-10-27 DIAGNOSIS — I10 Essential (primary) hypertension: Secondary | ICD-10-CM | POA: Insufficient documentation

## 2019-10-27 DIAGNOSIS — G5612 Other lesions of median nerve, left upper limb: Secondary | ICD-10-CM | POA: Diagnosis not present

## 2019-10-27 DIAGNOSIS — S62301D Unspecified fracture of second metacarpal bone, left hand, subsequent encounter for fracture with routine healing: Secondary | ICD-10-CM | POA: Insufficient documentation

## 2019-10-27 DIAGNOSIS — W3189XA Contact with other specified machinery, initial encounter: Secondary | ICD-10-CM | POA: Diagnosis not present

## 2019-10-27 DIAGNOSIS — Y9289 Other specified places as the place of occurrence of the external cause: Secondary | ICD-10-CM | POA: Diagnosis not present

## 2019-10-27 DIAGNOSIS — S62202D Unspecified fracture of first metacarpal bone, left hand, subsequent encounter for fracture with routine healing: Secondary | ICD-10-CM | POA: Diagnosis not present

## 2019-10-27 DIAGNOSIS — M25642 Stiffness of left hand, not elsewhere classified: Secondary | ICD-10-CM | POA: Diagnosis not present

## 2019-10-27 HISTORY — PX: CARPAL TUNNEL RELEASE: SHX101

## 2019-10-27 HISTORY — PX: NERVE REPAIR: SHX2083

## 2019-10-27 SURGERY — REPAIR, NERVE
Anesthesia: Monitor Anesthesia Care | Site: Hand | Laterality: Left

## 2019-10-27 MED ORDER — PROPOFOL 10 MG/ML IV BOLUS
INTRAVENOUS | Status: DC | PRN
  Administered 2019-10-27 (×2): 10 mg via INTRAVENOUS

## 2019-10-27 MED ORDER — LIDOCAINE HCL (CARDIAC) PF 100 MG/5ML IV SOSY
PREFILLED_SYRINGE | INTRAVENOUS | Status: DC | PRN
  Administered 2019-10-27: 40 mg via INTRAVENOUS

## 2019-10-27 MED ORDER — OXYCODONE HCL 5 MG/5ML PO SOLN: 5.0000 mg | Freq: Once | ORAL | Status: DC | PRN

## 2019-10-27 MED ORDER — PROPOFOL 500 MG/50ML IV EMUL
INTRAVENOUS | Status: AC
  Filled 2019-10-27: qty 50

## 2019-10-27 MED ORDER — PROMETHAZINE HCL 25 MG/ML IJ SOLN: 6.2500 mg | INTRAMUSCULAR | Status: DC | PRN

## 2019-10-27 MED ORDER — CEFAZOLIN SODIUM-DEXTROSE 2-4 GM/100ML-% IV SOLN
INTRAVENOUS | Status: AC
  Filled 2019-10-27: qty 100

## 2019-10-27 MED ORDER — ACETAMINOPHEN 325 MG PO TABS: 650.0000 mg | ORAL_TABLET | Freq: Four times a day (QID) | ORAL | Status: DC

## 2019-10-27 MED ORDER — FENTANYL CITRATE (PF) 100 MCG/2ML IJ SOLN
50.0000 ug | INTRAMUSCULAR | Status: DC | PRN
  Administered 2019-10-27: 100 ug via INTRAVENOUS

## 2019-10-27 MED ORDER — LACTATED RINGERS IV SOLN: INTRAVENOUS | Status: DC

## 2019-10-27 MED ORDER — FENTANYL CITRATE (PF) 100 MCG/2ML IJ SOLN
INTRAMUSCULAR | Status: AC
  Filled 2019-10-27: qty 2

## 2019-10-27 MED ORDER — LIDOCAINE HCL (PF) 1 % IJ SOLN
INTRAMUSCULAR | Status: AC
  Filled 2019-10-27: qty 30

## 2019-10-27 MED ORDER — OXYCODONE HCL 5 MG PO TABS: 5.0000 mg | ORAL_TABLET | Freq: Once | ORAL | Status: DC | PRN

## 2019-10-27 MED ORDER — OXYCODONE HCL 5 MG PO TABS: 5.0000 mg | ORAL_TABLET | Freq: Four times a day (QID) | ORAL | 0 refills | Status: DC | PRN

## 2019-10-27 MED ORDER — ONDANSETRON HCL 4 MG/2ML IJ SOLN
INTRAMUSCULAR | Status: AC
  Filled 2019-10-27: qty 2

## 2019-10-27 MED ORDER — PHENYLEPHRINE HCL (PRESSORS) 10 MG/ML IV SOLN
INTRAVENOUS | Status: DC | PRN
  Administered 2019-10-27: 40 ug via INTRAVENOUS

## 2019-10-27 MED ORDER — ROPIVACAINE HCL 5 MG/ML IJ SOLN
INTRAMUSCULAR | Status: DC | PRN
  Administered 2019-10-27: 30 mL via PERINEURAL

## 2019-10-27 MED ORDER — BUPIVACAINE HCL (PF) 0.5 % IJ SOLN
INTRAMUSCULAR | Status: AC
  Filled 2019-10-27: qty 30

## 2019-10-27 MED ORDER — IBUPROFEN 200 MG PO TABS: 600.0000 mg | ORAL_TABLET | Freq: Four times a day (QID) | ORAL | 0 refills | Status: DC

## 2019-10-27 MED ORDER — PROPOFOL 500 MG/50ML IV EMUL
INTRAVENOUS | Status: DC | PRN
  Administered 2019-10-27: 100 ug/kg/min via INTRAVENOUS

## 2019-10-27 MED ORDER — MIDAZOLAM HCL 2 MG/2ML IJ SOLN
INTRAMUSCULAR | Status: AC
  Filled 2019-10-27: qty 2

## 2019-10-27 MED ORDER — ONDANSETRON HCL 4 MG/2ML IJ SOLN
INTRAMUSCULAR | Status: DC | PRN
  Administered 2019-10-27: 4 mg via INTRAVENOUS

## 2019-10-27 MED ORDER — LIDOCAINE-EPINEPHRINE 1 %-1:100000 IJ SOLN
INTRAMUSCULAR | Status: AC
  Filled 2019-10-27: qty 1

## 2019-10-27 MED ORDER — MIDAZOLAM HCL 2 MG/2ML IJ SOLN
1.0000 mg | INTRAMUSCULAR | Status: DC | PRN
  Administered 2019-10-27: 2 mg via INTRAVENOUS

## 2019-10-27 MED ORDER — HYDROMORPHONE HCL 1 MG/ML IJ SOLN: 0.2500 mg | INTRAMUSCULAR | Status: DC | PRN

## 2019-10-27 MED ORDER — CEFAZOLIN SODIUM-DEXTROSE 2-4 GM/100ML-% IV SOLN
2.0000 g | INTRAVENOUS | Status: AC
  Administered 2019-10-27: 2 g via INTRAVENOUS

## 2019-10-27 SURGICAL SUPPLY — 77 items
APL PRP STRL LF DISP 70% ISPRP (MISCELLANEOUS) ×2
BLADE MINI RND TIP GREEN BEAV (BLADE) IMPLANT
BLADE SURG 15 STRL LF DISP TIS (BLADE) ×2 IMPLANT
BLADE SURG 15 STRL SS (BLADE) ×3
BNDG CMPR 9X4 STRL LF SNTH (GAUZE/BANDAGES/DRESSINGS) ×2
BNDG COHESIVE 4X5 TAN STRL (GAUZE/BANDAGES/DRESSINGS) ×3 IMPLANT
BNDG ESMARK 4X9 LF (GAUZE/BANDAGES/DRESSINGS) ×1 IMPLANT
BNDG GAUZE ELAST 4 BULKY (GAUZE/BANDAGES/DRESSINGS) ×3 IMPLANT
CHLORAPREP W/TINT 26 (MISCELLANEOUS) ×3 IMPLANT
CORD BIPOLAR FORCEPS 12FT (ELECTRODE) ×3 IMPLANT
COVER BACK TABLE 60X90IN (DRAPES) ×3 IMPLANT
COVER MAYO STAND STRL (DRAPES) ×3 IMPLANT
COVER WAND RF STERILE (DRAPES) IMPLANT
CUFF TOURN SGL QUICK 18X4 (TOURNIQUET CUFF) ×1 IMPLANT
DECANTER SPIKE VIAL GLASS SM (MISCELLANEOUS) IMPLANT
DEPRESSOR TONGUE BLADE STERILE (MISCELLANEOUS) ×1 IMPLANT
DRAPE EXTREMITY T 121X128X90 (DISPOSABLE) ×3 IMPLANT
DRAPE SURG 17X23 STRL (DRAPES) ×3 IMPLANT
DRSG EMULSION OIL 3X3 NADH (GAUZE/BANDAGES/DRESSINGS) ×3 IMPLANT
ELECT REM PT RETURN 9FT ADLT (ELECTROSURGICAL)
ELECTRODE REM PT RTRN 9FT ADLT (ELECTROSURGICAL) IMPLANT
GAUZE 4X4 16PLY RFD (DISPOSABLE) ×2 IMPLANT
GAUZE SPONGE 4X4 12PLY STRL LF (GAUZE/BANDAGES/DRESSINGS) ×3 IMPLANT
GLOVE BIO SURGEON STRL SZ7.5 (GLOVE) ×3 IMPLANT
GLOVE BIOGEL PI IND STRL 7.0 (GLOVE) ×2 IMPLANT
GLOVE BIOGEL PI IND STRL 7.5 (GLOVE) IMPLANT
GLOVE BIOGEL PI IND STRL 8 (GLOVE) ×2 IMPLANT
GLOVE BIOGEL PI INDICATOR 7.0 (GLOVE) ×1
GLOVE BIOGEL PI INDICATOR 7.5 (GLOVE) ×1
GLOVE BIOGEL PI INDICATOR 8 (GLOVE) ×1
GLOVE ECLIPSE 6.5 STRL STRAW (GLOVE) ×3 IMPLANT
GLOVE SURG SS PI 7.5 STRL IVOR (GLOVE) ×1 IMPLANT
GOWN STRL REUS W/ TWL LRG LVL3 (GOWN DISPOSABLE) ×4 IMPLANT
GOWN STRL REUS W/TWL LRG LVL3 (GOWN DISPOSABLE) ×3
GOWN STRL REUS W/TWL XL LVL3 (GOWN DISPOSABLE) ×4 IMPLANT
GRAFT NERVE AVANCE 1-2X30 (Nerve Graft) ×1 IMPLANT
GRAFT NERVE AVANCE 2-3X15 (Nerve Graft) ×2 IMPLANT
GRAFT NERVE AVANCE 2-3X30 (Nerve Graft) ×2 IMPLANT
LOOP VESSEL MAXI BLUE (MISCELLANEOUS) IMPLANT
LOOP VESSEL MINI RED (MISCELLANEOUS) IMPLANT
NDL HYPO 25X1 1.5 SAFETY (NEEDLE) IMPLANT
NEEDLE HYPO 25X1 1.5 SAFETY (NEEDLE) IMPLANT
NS IRRIG 1000ML POUR BTL (IV SOLUTION) ×3 IMPLANT
PACK BASIN DAY SURGERY FS (CUSTOM PROCEDURE TRAY) ×3 IMPLANT
PADDING CAST ABS 3INX4YD NS (CAST SUPPLIES)
PADDING CAST ABS 4INX4YD NS (CAST SUPPLIES) ×1
PADDING CAST ABS COTTON 3X4 (CAST SUPPLIES) IMPLANT
PADDING CAST ABS COTTON 4X4 ST (CAST SUPPLIES) ×2 IMPLANT
PENCIL SMOKE EVACUATOR (MISCELLANEOUS) IMPLANT
SLEEVE SCD COMPRESS KNEE MED (MISCELLANEOUS) IMPLANT
SLING ARM FOAM STRAP LRG (SOFTGOODS) ×1 IMPLANT
SPEAR EYE SURG WECK-CEL (MISCELLANEOUS) ×3 IMPLANT
SPLINT FAST PLASTER 5X30 (CAST SUPPLIES)
SPLINT PLASTER CAST FAST 5X30 (CAST SUPPLIES) IMPLANT
SPLINT PLASTER CAST XFAST 3X15 (CAST SUPPLIES) IMPLANT
SPLINT PLASTER CAST XFAST 4X15 (CAST SUPPLIES) IMPLANT
SPLINT PLASTER XTRA FAST SET 4 (CAST SUPPLIES) ×5
SPLINT PLASTER XTRA FASTSET 3X (CAST SUPPLIES)
STOCKINETTE 6  STRL (DRAPES) ×3
STOCKINETTE 6 STRL (DRAPES) ×2 IMPLANT
SUT ETHIBOND 3-0 V-5 (SUTURE) ×2 IMPLANT
SUT ETHILON 4 0 PS 2 18 (SUTURE) ×1 IMPLANT
SUT ETHILON 8 0 BV130 4 (SUTURE) ×5 IMPLANT
SUT ETHILON 9 0 V 100.4 (SUTURE) ×3 IMPLANT
SUT FIBERWIRE 2-0 18 17.9 3/8 (SUTURE)
SUT NYLON 9 0 VRM6 (SUTURE) IMPLANT
SUT PROLENE 6 0 P 1 18 (SUTURE) ×3 IMPLANT
SUT SILK 4 0 PS 2 (SUTURE) IMPLANT
SUT STEEL 4 (SUTURE) ×3 IMPLANT
SUT VICRYL RAPIDE 4-0 (SUTURE) ×1 IMPLANT
SUT VICRYL RAPIDE 4/0 PS 2 (SUTURE) ×4 IMPLANT
SUTURE FIBERWR 2-0 18 17.9 3/8 (SUTURE) IMPLANT
SYR 10ML LL (SYRINGE) IMPLANT
SYR BULB 3OZ (MISCELLANEOUS) ×3 IMPLANT
TOWEL GREEN STERILE FF (TOWEL DISPOSABLE) ×3 IMPLANT
TUBE CONNECTING 20X1/4 (TUBING) IMPLANT
UNDERPAD 30X36 HEAVY ABSORB (UNDERPADS AND DIAPERS) ×3 IMPLANT

## 2019-10-27 NOTE — Anesthesia Preprocedure Evaluation (Signed)
Anesthesia Evaluation  Patient identified by MRN, date of birth, ID band Patient awake    Reviewed: Allergy & Precautions, NPO status , Patient's Chart, lab work & pertinent test results  Airway Mallampati: II  TM Distance: >3 FB Neck ROM: Full    Dental no notable dental hx.    Pulmonary neg pulmonary ROS,    Pulmonary exam normal breath sounds clear to auscultation       Cardiovascular hypertension, Pt. on medications negative cardio ROS Normal cardiovascular exam Rhythm:Regular Rate:Normal     Neuro/Psych negative neurological ROS  negative psych ROS   GI/Hepatic Neg liver ROS, GERD  ,  Endo/Other  negative endocrine ROS  Renal/GU Renal InsufficiencyRenal disease  negative genitourinary   Musculoskeletal negative musculoskeletal ROS (+)   Abdominal   Peds negative pediatric ROS (+)  Hematology negative hematology ROS (+)   Anesthesia Other Findings   Reproductive/Obstetrics negative OB ROS                             Anesthesia Physical Anesthesia Plan  ASA: II  Anesthesia Plan: MAC and Regional   Post-op Pain Management:    Induction: Intravenous  PONV Risk Score and Plan: 1 and Ondansetron and Treatment may vary due to age or medical condition  Airway Management Planned: Simple Face Mask  Additional Equipment:   Intra-op Plan:   Post-operative Plan:   Informed Consent: I have reviewed the patients History and Physical, chart, labs and discussed the procedure including the risks, benefits and alternatives for the proposed anesthesia with the patient or authorized representative who has indicated his/her understanding and acceptance.     Dental advisory given  Plan Discussed with: CRNA  Anesthesia Plan Comments:         Anesthesia Quick Evaluation

## 2019-10-27 NOTE — Interval H&P Note (Signed)
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  The various methods of treatment have been discussed with the patient and family. After consideration of risks, benefits and other options for treatment, the patient has consented to  Procedure(s) with comments: LEFT HAND MEDIAN NEUROPLASTY WITH POSSIBLE NERVE GRAFTING (Left) - SURGERY: LEFT HAND MEDIAN NEUROPLASTY WITH POSSIBLE NERVE GRAFTING  LENGTH: 2 HOURS PRE OP BLOCK as a surgical intervention.  The patient's history has been reviewed, patient examined, no change in status, stable for surgery.  I have reviewed the patient's chart and labs.  Questions were answered to the patient's satisfaction.     Jolyn Nap

## 2019-10-27 NOTE — Discharge Instructions (Signed)
Discharge Instructions   You have a dressing with a plaster splint incorporated in it. Move your fingers as much as possible, making a full fist and fully opening the fist. Elevate your hand to reduce pain & swelling of the digits.  Ice over the operative site may be helpful to reduce pain & swelling.  DO NOT USE HEAT. Pain medicine has been prescribed for you.  Take ibuprofen 600 mg and Tylenol 650 mg every 6 hours together. Take Oxycodone 5 mg additionally for severe post operative pain. Leave the dressing in place until you return to our office.  You may shower, but keep the bandage clean & dry.  You may drive a car when you are off of prescription pain medications and can safely control your vehicle with both hands. Call our office to make an appointment for 10-15 days from the date of surgery.   Please call [phone] during normal business hours or [phone] after hours for any problems. Including the following:  - excessive redness of the incisions - drainage for more than 4 days - fever of more than 101.5 F  *Please note that pain medications will not be refilled after hours or on weekends.  Work Status: Patient may return to work 10/29/19 with no lifting, gripping, or grasping greater than pencil and paper tasks with the left hand.    Post Anesthesia Home Care Instructions  Activity: Get plenty of rest for the remainder of the day. A responsible individual must stay with you for 24 hours following the procedure.  For the next 24 hours, DO NOT: -Drive a car -Paediatric nurse -Drink alcoholic beverages -Take any medication unless instructed by your physician -Make any legal decisions or sign important papers.  Meals: Start with liquid foods such as gelatin or soup. Progress to regular foods as tolerated. Avoid greasy, spicy, heavy foods. If nausea and/or vomiting occur, drink only clear liquids until the nausea and/or vomiting subsides. Call your physician if  vomiting continues.  Special Instructions/Symptoms: Your throat may feel dry or sore from the anesthesia or the breathing tube placed in your throat during surgery. If this causes discomfort, gargle with warm salt water. The discomfort should disappear within 24 hours.  If you had a scopolamine patch placed behind your ear for the management of post- operative nausea and/or vomiting:  1. The medication in the patch is effective for 72 hours, after which it should be removed.  Wrap patch in a tissue and discard in the trash. Wash hands thoroughly with soap and water. 2. You may remove the patch earlier than 72 hours if you experience unpleasant side effects which may include dry mouth, dizziness or visual disturbances. 3. Avoid touching the patch. Wash your hands with soap and water after contact with the patch.    Regional Anesthesia Blocks  1. Numbness or the inability to move the "blocked" extremity may last from 3-48 hours after placement. The length of time depends on the medication injected and your individual response to the medication. If the numbness is not going away after 48 hours, call your surgeon.  2. The extremity that is blocked will need to be protected until the numbness is gone and the  Strength has returned. Because you cannot feel it, you will need to take extra care to avoid injury. Because it may be weak, you may have difficulty moving it or using it. You may not know what position it is in without looking at it while the block is in  effect.  3. For blocks in the legs and feet, returning to weight bearing and walking needs to be done carefully. You will need to wait until the numbness is entirely gone and the strength has returned. You should be able to move your leg and foot normally before you try and bear weight or walk. You will need someone to be with you when you first try to ensure you do not fall and possibly risk injury.  4. Bruising and tenderness at the needle site  are common side effects and will resolve in a few days.  5. Persistent numbness or new problems with movement should be communicated to the surgeon or the [facility] [phone]).

## 2019-10-27 NOTE — Anesthesia Postprocedure Evaluation (Signed)
Anesthesia Post Note  Patient: Javier Casey  Procedure(s) Performed: LEFT HAND MEDIAN NEUROPLASTY (Left Hand) CARPAL TUNNEL RELEASE (Left Hand)     Patient location during evaluation: PACU Anesthesia Type: Regional and MAC Level of consciousness: awake and alert Pain management: pain level controlled Vital Signs Assessment: post-procedure vital signs reviewed and stable Respiratory status: spontaneous breathing, nonlabored ventilation and respiratory function stable Cardiovascular status: blood pressure returned to baseline and stable Postop Assessment: no apparent nausea or vomiting Anesthetic complications: no    Last Vitals:  Vitals:   10/27/19 1127 10/27/19 1206  BP: 119/69 121/70  Pulse: 63 66  Resp: 19 18  Temp:  36.5 C  SpO2: 100% 100%    Last Pain:  Vitals:   10/27/19 1206  TempSrc: Oral  PainSc: 0-No pain                 Lynda Rainwater

## 2019-10-27 NOTE — Op Note (Signed)
10/27/2019  7:28 AM  PATIENT:  Javier Casey  64 y.o. male  PRE-OPERATIVE DIAGNOSIS:  Left hand trauma with stiffness and nerve dysfunction  Casey-OPERATIVE DIAGNOSIS: Left hand penetrating trauma with stiffness, scarring, and median nerve injury  PROCEDURE:   1. Left hand median neuroplasty at the wrist/carpal tunnel    2. Left hand/palm neuroplasty of: CDN to 2/3 web         RDN IF         UDN thumb         RDN thumb         Recurrent median motor branch    3. Left palm/carpal tunnel FDS tenolysis    4. Left palm/carpal tunnel FDP tenolysis    5. Left hand/palm nerve grafting with Avance allograft to: CDN to 2/3 web            RDN IF/UDN thumb            RDN thumb            Recurrent median motor branch    6. Left hand joint manipulation of IF/LF/RF/SF  SURGEON: Rayvon Char. Grandville Silos, MD  PHYSICIAN ASSISTANT: Morley Kos, OPA-C  ANESTHESIA:  regional and MAC  SPECIMENS:  None  DRAINS:   None  EBL:  less than 50 mL  PREOPERATIVE INDICATIONS:  Javier Casey is a  64 y.o. male with subacute left hand penetrating trauma  The risks benefits and alternatives were discussed with the patient preoperatively including but not limited to the risks of infection, bleeding, nerve injury, cardiopulmonary complications, the need for revision surgery, among others, and the patient verbalized understanding and consented to proceed.  OPERATIVE IMPLANTS: Avance nerve allograft: 1-49mx30mm, 2-327mx 3012m 2, 2-3mm72mmm x 2  OPERATIVE PROCEDURE:    This patient was identified in the holding area where he received a preoperative regional block.  He was escorted to the operative theater and placed in the supine position.  A tourniquet was applied to left upper extremity but not inflated.  The hand and arm were then prepped with ChloraPrep and draped in usual sterile fashion.  Surgical timeout was observed and prophylactic antibiotics were addressed.  The limb was exsanguinated with an Esmarch  bandage and tourniquet inflated to 250 mmHg.  A traditional open carpal tunnel incision was marked and made sharply with a scalpel.  The tunnel was entered proximally in the thickened and scarred palmar fascia split in line with the radial border of the ring ray, followed by the division of the transverse carpal ligament.  There was tremendous scarring in the region of the distal aspect of the carpal tunnel and mid palm.  Guyon's canal was also released to follow the ulnar neurovascular structures, and the superficial palmar arch was found to have been divided and clotted and scarred in the region where Guyon's canal was ending.  The traditional carpal tunnel release incision was extended distally into the thenar crease, and obliquely across the wrist creases.  Ultimately the main portion of the median nerve was found fully divided in the proximal aspect of the carpal tunnel where it was blunted and rounded at the end.  There was no nerve itself within the tunnel.  Distally, another zigzag incision was created on the volar aspect of the thumb and meticulous dissection was performed throughout the mid palm to trace out the distal branches of the median nerve which were also bound in scar.  Ultimately there was a portion of the  median nerve that really contain the common digital nerve to the second and third webspaces.  Additionally there was a branch that would be the radial digital nerve to the index, another that was the ulnar digital nerve to the thumb, the radial digital nerve to the thumb, and the thenar motor branch.  All of these have been divided in the palmar zone 4.  Additionally, the FDS to the index finger was found transected, and would have been quite tight to try to reapproximate and so ultimately was left alone.  However due to scarring in the carpal tunnel and throughout the mid palm, flexor tenolysis throughout the palm and carpal tunnel was performed for the FDP and FDS tendons that remained in  continuity.  The tourniquet was released some additional hemostasis obtained.  After neuroplasty of each of these and branches was performed, the proximal stumps were freshened with scissors to create a transverse surface for grafting and pouting fascicles.  A number of a Vance allograft nerves were thawed.  A 2-3 mm x 30 mm was secured distally to the as directed end of the thumb and RDN of the index finger, creating a Y configuration.  A 1-2 mm x 30 mm was similarly grafted to the recurrent motor stump at the thenar muscles.  2-3 mm x 15 mm grafts were laid into the larger stump that contain the common digital nerves of the second and third webspaces.  Next, using the same 8-0 nylon suture, each of these grafts were secured to the median nerve proper proximally.  An effort was made to place the recurrent motor branch and the expected topographical location for such fascicles.  At this point, it was unclear whether there would be sufficient graft length to include grafting to the ulnar digital nerve to the thumb.  A 2-3 mm x 30 mm graft   was secured distally to the stump, and proximally it could not reach the median nerve proper.  At this point, 1 of the shorter 15 mm graft that had been grafted into the common digital nerve to the second and third web space was taken down distally, kept secured to the nerve proximally, and this was used to graft to the 30 mm graft going to the radial digital nerve of the thumb.  At this point, some final photographs were obtained.  The digits were all slowly and steadily manipulated as I was waiting for the last graft that was obtained from the hospital to be obtained in thought.  During this time long slow steady stretching of the digits was performed to get them into a tighter flexed posture, essentially manipulating all the DIP, PIP, and MP joints to obtain better composite flexion passively.  I also manipulated the thumb out of the plane of the palm and into a position of  opposition as best could be done.  Once all of the neurorrhaphy's were performed, the wound was again copiously irrigated and the skin reapproximated with 4-0 Vicryl Rapide interrupted sutures.  A short arm splint dressing was applied, placing the wrist in neutral and allowing for the digits to be free to flex.  An effort was made to splint the thumb into a better position of opposition as well.  He was taken to the recovery room in stable condition.  DISPOSITION: He will be discharged home today with typical instructions, beginning finger motion right away, returning for reevaluation in 10-15 days.

## 2019-10-27 NOTE — Anesthesia Procedure Notes (Signed)
Anesthesia Regional Block: Supraclavicular block   Pre-Anesthetic Checklist: ,, timeout performed, Correct Patient, Correct Site, Correct Laterality, Correct Procedure, Correct Position, site marked, Risks and benefits discussed,  Surgical consent,  Pre-op evaluation,  At surgeon's request and post-op pain management  Laterality: Left  Prep: chloraprep       Needles:  Injection technique: Single-shot  Needle Type: Stimiplex     Needle Length: 9cm  Needle Gauge: 21     Additional Needles:   Procedures:,,,, ultrasound used (permanent image in chart),,,,  Narrative:  Start time: 10/27/2019 7:19 AM End time: 10/27/2019 7:24 AM Injection made incrementally with aspirations every 5 mL.  Performed by: Personally  Anesthesiologist: Lynda Rainwater, MD

## 2019-10-27 NOTE — Progress Notes (Signed)
Assisted Dr. Miller with left, ultrasound guided, supraclavicular block. Side rails up, monitors on throughout procedure. See vital signs in flow sheet. Tolerated Procedure well. 

## 2019-10-27 NOTE — Transfer of Care (Signed)
Immediate Anesthesia Transfer of Care Note  Patient: Javier Casey  Procedure(s) Performed: LEFT HAND MEDIAN NEUROPLASTY (Left Hand) CARPAL TUNNEL RELEASE (Left Hand)  Patient Location: PACU  Anesthesia Type:MAC combined with regional for post-op pain  Level of Consciousness: awake, alert  and oriented  Airway & Oxygen Therapy: Patient Spontanous Breathing and Patient connected to nasal cannula oxygen  Post-op Assessment: Report given to RN and Post -op Vital signs reviewed and stable  Post vital signs: Reviewed and stable  Last Vitals:  Vitals Value Taken Time  BP 105/66 10/27/19 1039  Temp    Pulse 75 10/27/19 1041  Resp 14 10/27/19 1041  SpO2 100 % 10/27/19 1041  Vitals shown include unvalidated device data.  Last Pain:  Vitals:   10/27/19 0623  TempSrc: Temporal  PainSc: 0-No pain         Complications: No apparent anesthesia complications

## 2019-10-28 ENCOUNTER — Encounter: Payer: Self-pay | Admitting: *Deleted

## 2019-11-13 DIAGNOSIS — S6412XD Injury of median nerve at wrist and hand level of left arm, subsequent encounter: Secondary | ICD-10-CM | POA: Diagnosis not present

## 2019-11-13 DIAGNOSIS — R600 Localized edema: Secondary | ICD-10-CM | POA: Diagnosis not present

## 2019-11-13 DIAGNOSIS — M25642 Stiffness of left hand, not elsewhere classified: Secondary | ICD-10-CM | POA: Diagnosis not present

## 2019-11-17 DIAGNOSIS — M25642 Stiffness of left hand, not elsewhere classified: Secondary | ICD-10-CM | POA: Diagnosis not present

## 2019-11-17 DIAGNOSIS — R600 Localized edema: Secondary | ICD-10-CM | POA: Diagnosis not present

## 2019-11-17 DIAGNOSIS — S6412XD Injury of median nerve at wrist and hand level of left arm, subsequent encounter: Secondary | ICD-10-CM | POA: Diagnosis not present

## 2019-11-20 DIAGNOSIS — M25642 Stiffness of left hand, not elsewhere classified: Secondary | ICD-10-CM | POA: Diagnosis not present

## 2019-11-20 DIAGNOSIS — S6412XD Injury of median nerve at wrist and hand level of left arm, subsequent encounter: Secondary | ICD-10-CM | POA: Diagnosis not present

## 2019-11-20 DIAGNOSIS — R600 Localized edema: Secondary | ICD-10-CM | POA: Diagnosis not present

## 2019-11-24 DIAGNOSIS — M25642 Stiffness of left hand, not elsewhere classified: Secondary | ICD-10-CM | POA: Diagnosis not present

## 2019-11-24 DIAGNOSIS — S6412XD Injury of median nerve at wrist and hand level of left arm, subsequent encounter: Secondary | ICD-10-CM | POA: Diagnosis not present

## 2019-11-24 DIAGNOSIS — R600 Localized edema: Secondary | ICD-10-CM | POA: Diagnosis not present

## 2019-11-27 DIAGNOSIS — R600 Localized edema: Secondary | ICD-10-CM | POA: Diagnosis not present

## 2019-11-27 DIAGNOSIS — S6412XD Injury of median nerve at wrist and hand level of left arm, subsequent encounter: Secondary | ICD-10-CM | POA: Diagnosis not present

## 2019-11-27 DIAGNOSIS — M25642 Stiffness of left hand, not elsewhere classified: Secondary | ICD-10-CM | POA: Diagnosis not present

## 2019-12-01 DIAGNOSIS — M25642 Stiffness of left hand, not elsewhere classified: Secondary | ICD-10-CM | POA: Diagnosis not present

## 2019-12-01 DIAGNOSIS — S6412XD Injury of median nerve at wrist and hand level of left arm, subsequent encounter: Secondary | ICD-10-CM | POA: Diagnosis not present

## 2019-12-01 DIAGNOSIS — R600 Localized edema: Secondary | ICD-10-CM | POA: Diagnosis not present

## 2019-12-04 DIAGNOSIS — M25642 Stiffness of left hand, not elsewhere classified: Secondary | ICD-10-CM | POA: Diagnosis not present

## 2019-12-04 DIAGNOSIS — S6412XD Injury of median nerve at wrist and hand level of left arm, subsequent encounter: Secondary | ICD-10-CM | POA: Diagnosis not present

## 2019-12-04 DIAGNOSIS — R600 Localized edema: Secondary | ICD-10-CM | POA: Diagnosis not present

## 2019-12-08 DIAGNOSIS — S6412XD Injury of median nerve at wrist and hand level of left arm, subsequent encounter: Secondary | ICD-10-CM | POA: Diagnosis not present

## 2019-12-08 DIAGNOSIS — M25642 Stiffness of left hand, not elsewhere classified: Secondary | ICD-10-CM | POA: Diagnosis not present

## 2019-12-08 DIAGNOSIS — R600 Localized edema: Secondary | ICD-10-CM | POA: Diagnosis not present

## 2019-12-11 DIAGNOSIS — R600 Localized edema: Secondary | ICD-10-CM | POA: Diagnosis not present

## 2019-12-11 DIAGNOSIS — S6412XD Injury of median nerve at wrist and hand level of left arm, subsequent encounter: Secondary | ICD-10-CM | POA: Diagnosis not present

## 2019-12-11 DIAGNOSIS — M25642 Stiffness of left hand, not elsewhere classified: Secondary | ICD-10-CM | POA: Diagnosis not present

## 2019-12-11 DIAGNOSIS — G5612 Other lesions of median nerve, left upper limb: Secondary | ICD-10-CM | POA: Diagnosis not present

## 2019-12-17 DIAGNOSIS — M25642 Stiffness of left hand, not elsewhere classified: Secondary | ICD-10-CM | POA: Diagnosis not present

## 2019-12-17 DIAGNOSIS — R600 Localized edema: Secondary | ICD-10-CM | POA: Diagnosis not present

## 2019-12-17 DIAGNOSIS — S6412XD Injury of median nerve at wrist and hand level of left arm, subsequent encounter: Secondary | ICD-10-CM | POA: Diagnosis not present

## 2019-12-22 DIAGNOSIS — S6412XD Injury of median nerve at wrist and hand level of left arm, subsequent encounter: Secondary | ICD-10-CM | POA: Diagnosis not present

## 2019-12-22 DIAGNOSIS — R600 Localized edema: Secondary | ICD-10-CM | POA: Diagnosis not present

## 2019-12-22 DIAGNOSIS — M25642 Stiffness of left hand, not elsewhere classified: Secondary | ICD-10-CM | POA: Diagnosis not present

## 2019-12-29 DIAGNOSIS — M25642 Stiffness of left hand, not elsewhere classified: Secondary | ICD-10-CM | POA: Diagnosis not present

## 2019-12-29 DIAGNOSIS — R600 Localized edema: Secondary | ICD-10-CM | POA: Diagnosis not present

## 2019-12-29 DIAGNOSIS — S6412XD Injury of median nerve at wrist and hand level of left arm, subsequent encounter: Secondary | ICD-10-CM | POA: Diagnosis not present

## 2020-01-05 DIAGNOSIS — R600 Localized edema: Secondary | ICD-10-CM | POA: Diagnosis not present

## 2020-01-05 DIAGNOSIS — S6412XD Injury of median nerve at wrist and hand level of left arm, subsequent encounter: Secondary | ICD-10-CM | POA: Diagnosis not present

## 2020-01-05 DIAGNOSIS — M25642 Stiffness of left hand, not elsewhere classified: Secondary | ICD-10-CM | POA: Diagnosis not present

## 2020-01-15 DIAGNOSIS — M25642 Stiffness of left hand, not elsewhere classified: Secondary | ICD-10-CM | POA: Diagnosis not present

## 2020-01-15 DIAGNOSIS — S6412XD Injury of median nerve at wrist and hand level of left arm, subsequent encounter: Secondary | ICD-10-CM | POA: Diagnosis not present

## 2020-01-15 DIAGNOSIS — R600 Localized edema: Secondary | ICD-10-CM | POA: Diagnosis not present

## 2020-01-15 DIAGNOSIS — G5612 Other lesions of median nerve, left upper limb: Secondary | ICD-10-CM | POA: Diagnosis not present

## 2020-01-20 DIAGNOSIS — R600 Localized edema: Secondary | ICD-10-CM | POA: Diagnosis not present

## 2020-01-20 DIAGNOSIS — M25642 Stiffness of left hand, not elsewhere classified: Secondary | ICD-10-CM | POA: Diagnosis not present

## 2020-01-20 DIAGNOSIS — S6412XD Injury of median nerve at wrist and hand level of left arm, subsequent encounter: Secondary | ICD-10-CM | POA: Diagnosis not present

## 2020-01-26 DIAGNOSIS — R600 Localized edema: Secondary | ICD-10-CM | POA: Diagnosis not present

## 2020-01-26 DIAGNOSIS — M25642 Stiffness of left hand, not elsewhere classified: Secondary | ICD-10-CM | POA: Diagnosis not present

## 2020-01-26 DIAGNOSIS — S6412XD Injury of median nerve at wrist and hand level of left arm, subsequent encounter: Secondary | ICD-10-CM | POA: Diagnosis not present

## 2020-02-09 DIAGNOSIS — M25642 Stiffness of left hand, not elsewhere classified: Secondary | ICD-10-CM | POA: Diagnosis not present

## 2020-02-09 DIAGNOSIS — R600 Localized edema: Secondary | ICD-10-CM | POA: Diagnosis not present

## 2020-02-09 DIAGNOSIS — S6412XD Injury of median nerve at wrist and hand level of left arm, subsequent encounter: Secondary | ICD-10-CM | POA: Diagnosis not present

## 2020-02-23 DIAGNOSIS — M25642 Stiffness of left hand, not elsewhere classified: Secondary | ICD-10-CM | POA: Diagnosis not present

## 2020-02-23 DIAGNOSIS — S6412XD Injury of median nerve at wrist and hand level of left arm, subsequent encounter: Secondary | ICD-10-CM | POA: Diagnosis not present

## 2020-02-23 DIAGNOSIS — R600 Localized edema: Secondary | ICD-10-CM | POA: Diagnosis not present

## 2020-03-04 DIAGNOSIS — M25642 Stiffness of left hand, not elsewhere classified: Secondary | ICD-10-CM | POA: Diagnosis not present

## 2020-03-08 DIAGNOSIS — R600 Localized edema: Secondary | ICD-10-CM | POA: Diagnosis not present

## 2020-03-08 DIAGNOSIS — M25642 Stiffness of left hand, not elsewhere classified: Secondary | ICD-10-CM | POA: Diagnosis not present

## 2020-03-08 DIAGNOSIS — S6412XD Injury of median nerve at wrist and hand level of left arm, subsequent encounter: Secondary | ICD-10-CM | POA: Diagnosis not present

## 2020-03-29 DIAGNOSIS — S6412XD Injury of median nerve at wrist and hand level of left arm, subsequent encounter: Secondary | ICD-10-CM | POA: Diagnosis not present

## 2020-03-29 DIAGNOSIS — M25642 Stiffness of left hand, not elsewhere classified: Secondary | ICD-10-CM | POA: Diagnosis not present

## 2020-03-29 DIAGNOSIS — R600 Localized edema: Secondary | ICD-10-CM | POA: Diagnosis not present

## 2020-05-11 DIAGNOSIS — M25642 Stiffness of left hand, not elsewhere classified: Secondary | ICD-10-CM | POA: Diagnosis not present

## 2020-05-11 DIAGNOSIS — G5612 Other lesions of median nerve, left upper limb: Secondary | ICD-10-CM | POA: Diagnosis not present

## 2020-06-22 DIAGNOSIS — G5612 Other lesions of median nerve, left upper limb: Secondary | ICD-10-CM | POA: Diagnosis not present

## 2020-06-22 DIAGNOSIS — M25642 Stiffness of left hand, not elsewhere classified: Secondary | ICD-10-CM | POA: Diagnosis not present

## 2020-06-25 DIAGNOSIS — G8918 Other acute postprocedural pain: Secondary | ICD-10-CM | POA: Diagnosis not present

## 2020-06-25 DIAGNOSIS — M24542 Contracture, left hand: Secondary | ICD-10-CM | POA: Diagnosis not present

## 2020-06-30 DIAGNOSIS — S6412XD Injury of median nerve at wrist and hand level of left arm, subsequent encounter: Secondary | ICD-10-CM | POA: Diagnosis not present

## 2020-06-30 DIAGNOSIS — M25642 Stiffness of left hand, not elsewhere classified: Secondary | ICD-10-CM | POA: Diagnosis not present

## 2020-07-05 DIAGNOSIS — S6412XD Injury of median nerve at wrist and hand level of left arm, subsequent encounter: Secondary | ICD-10-CM | POA: Diagnosis not present

## 2020-07-05 DIAGNOSIS — M25642 Stiffness of left hand, not elsewhere classified: Secondary | ICD-10-CM | POA: Diagnosis not present

## 2020-07-06 DIAGNOSIS — G5612 Other lesions of median nerve, left upper limb: Secondary | ICD-10-CM | POA: Diagnosis not present

## 2020-07-06 DIAGNOSIS — M25642 Stiffness of left hand, not elsewhere classified: Secondary | ICD-10-CM | POA: Diagnosis not present

## 2020-07-06 DIAGNOSIS — S60453A Superficial foreign body of left middle finger, initial encounter: Secondary | ICD-10-CM | POA: Diagnosis not present

## 2020-07-27 DIAGNOSIS — S6412XD Injury of median nerve at wrist and hand level of left arm, subsequent encounter: Secondary | ICD-10-CM | POA: Diagnosis not present

## 2020-07-27 DIAGNOSIS — M25642 Stiffness of left hand, not elsewhere classified: Secondary | ICD-10-CM | POA: Diagnosis not present

## 2020-07-29 DIAGNOSIS — S6412XD Injury of median nerve at wrist and hand level of left arm, subsequent encounter: Secondary | ICD-10-CM | POA: Diagnosis not present

## 2020-07-29 DIAGNOSIS — M25642 Stiffness of left hand, not elsewhere classified: Secondary | ICD-10-CM | POA: Diagnosis not present

## 2020-08-05 DIAGNOSIS — M25642 Stiffness of left hand, not elsewhere classified: Secondary | ICD-10-CM | POA: Diagnosis not present

## 2020-08-05 DIAGNOSIS — S6412XD Injury of median nerve at wrist and hand level of left arm, subsequent encounter: Secondary | ICD-10-CM | POA: Diagnosis not present

## 2020-08-10 DIAGNOSIS — M25642 Stiffness of left hand, not elsewhere classified: Secondary | ICD-10-CM | POA: Diagnosis not present

## 2020-08-10 DIAGNOSIS — S6412XD Injury of median nerve at wrist and hand level of left arm, subsequent encounter: Secondary | ICD-10-CM | POA: Diagnosis not present

## 2020-08-12 DIAGNOSIS — S6412XD Injury of median nerve at wrist and hand level of left arm, subsequent encounter: Secondary | ICD-10-CM | POA: Diagnosis not present

## 2020-08-12 DIAGNOSIS — M25642 Stiffness of left hand, not elsewhere classified: Secondary | ICD-10-CM | POA: Diagnosis not present

## 2020-08-26 DIAGNOSIS — M25642 Stiffness of left hand, not elsewhere classified: Secondary | ICD-10-CM | POA: Diagnosis not present

## 2020-08-26 DIAGNOSIS — S6412XD Injury of median nerve at wrist and hand level of left arm, subsequent encounter: Secondary | ICD-10-CM | POA: Diagnosis not present

## 2020-08-31 DIAGNOSIS — M25642 Stiffness of left hand, not elsewhere classified: Secondary | ICD-10-CM | POA: Diagnosis not present

## 2020-08-31 DIAGNOSIS — S6412XD Injury of median nerve at wrist and hand level of left arm, subsequent encounter: Secondary | ICD-10-CM | POA: Diagnosis not present

## 2020-09-02 DIAGNOSIS — S6412XD Injury of median nerve at wrist and hand level of left arm, subsequent encounter: Secondary | ICD-10-CM | POA: Diagnosis not present

## 2020-09-02 DIAGNOSIS — M25642 Stiffness of left hand, not elsewhere classified: Secondary | ICD-10-CM | POA: Diagnosis not present

## 2020-09-07 DIAGNOSIS — M25642 Stiffness of left hand, not elsewhere classified: Secondary | ICD-10-CM | POA: Diagnosis not present

## 2020-09-07 DIAGNOSIS — S6412XD Injury of median nerve at wrist and hand level of left arm, subsequent encounter: Secondary | ICD-10-CM | POA: Diagnosis not present

## 2020-09-16 DIAGNOSIS — S6412XD Injury of median nerve at wrist and hand level of left arm, subsequent encounter: Secondary | ICD-10-CM | POA: Diagnosis not present

## 2020-09-16 DIAGNOSIS — M25642 Stiffness of left hand, not elsewhere classified: Secondary | ICD-10-CM | POA: Diagnosis not present

## 2020-10-06 DIAGNOSIS — G5612 Other lesions of median nerve, left upper limb: Secondary | ICD-10-CM | POA: Diagnosis not present

## 2020-10-06 DIAGNOSIS — M25642 Stiffness of left hand, not elsewhere classified: Secondary | ICD-10-CM | POA: Diagnosis not present

## 2020-12-07 DIAGNOSIS — G5612 Other lesions of median nerve, left upper limb: Secondary | ICD-10-CM | POA: Diagnosis not present

## 2020-12-07 DIAGNOSIS — M25642 Stiffness of left hand, not elsewhere classified: Secondary | ICD-10-CM | POA: Diagnosis not present

## 2021-02-11 ENCOUNTER — Encounter (HOSPITAL_BASED_OUTPATIENT_CLINIC_OR_DEPARTMENT_OTHER): Payer: Self-pay | Admitting: Orthopedic Surgery

## 2021-03-29 ENCOUNTER — Encounter (HOSPITAL_BASED_OUTPATIENT_CLINIC_OR_DEPARTMENT_OTHER): Payer: Self-pay | Admitting: Orthopedic Surgery

## 2021-09-23 IMAGING — DX DG HAND COMPLETE 3+V*L*
3 series · 3 of 3 positions shown · non-contrast
Comparison: None.

CLINICAL DATA: Trauma in mid [place] Salim, router saw injury, decreased
range of motion

EXAM:
LEFT HAND - COMPLETE 3+ VIEW; LEFT WRIST - COMPLETE 3+ VIEW

[hand pa]
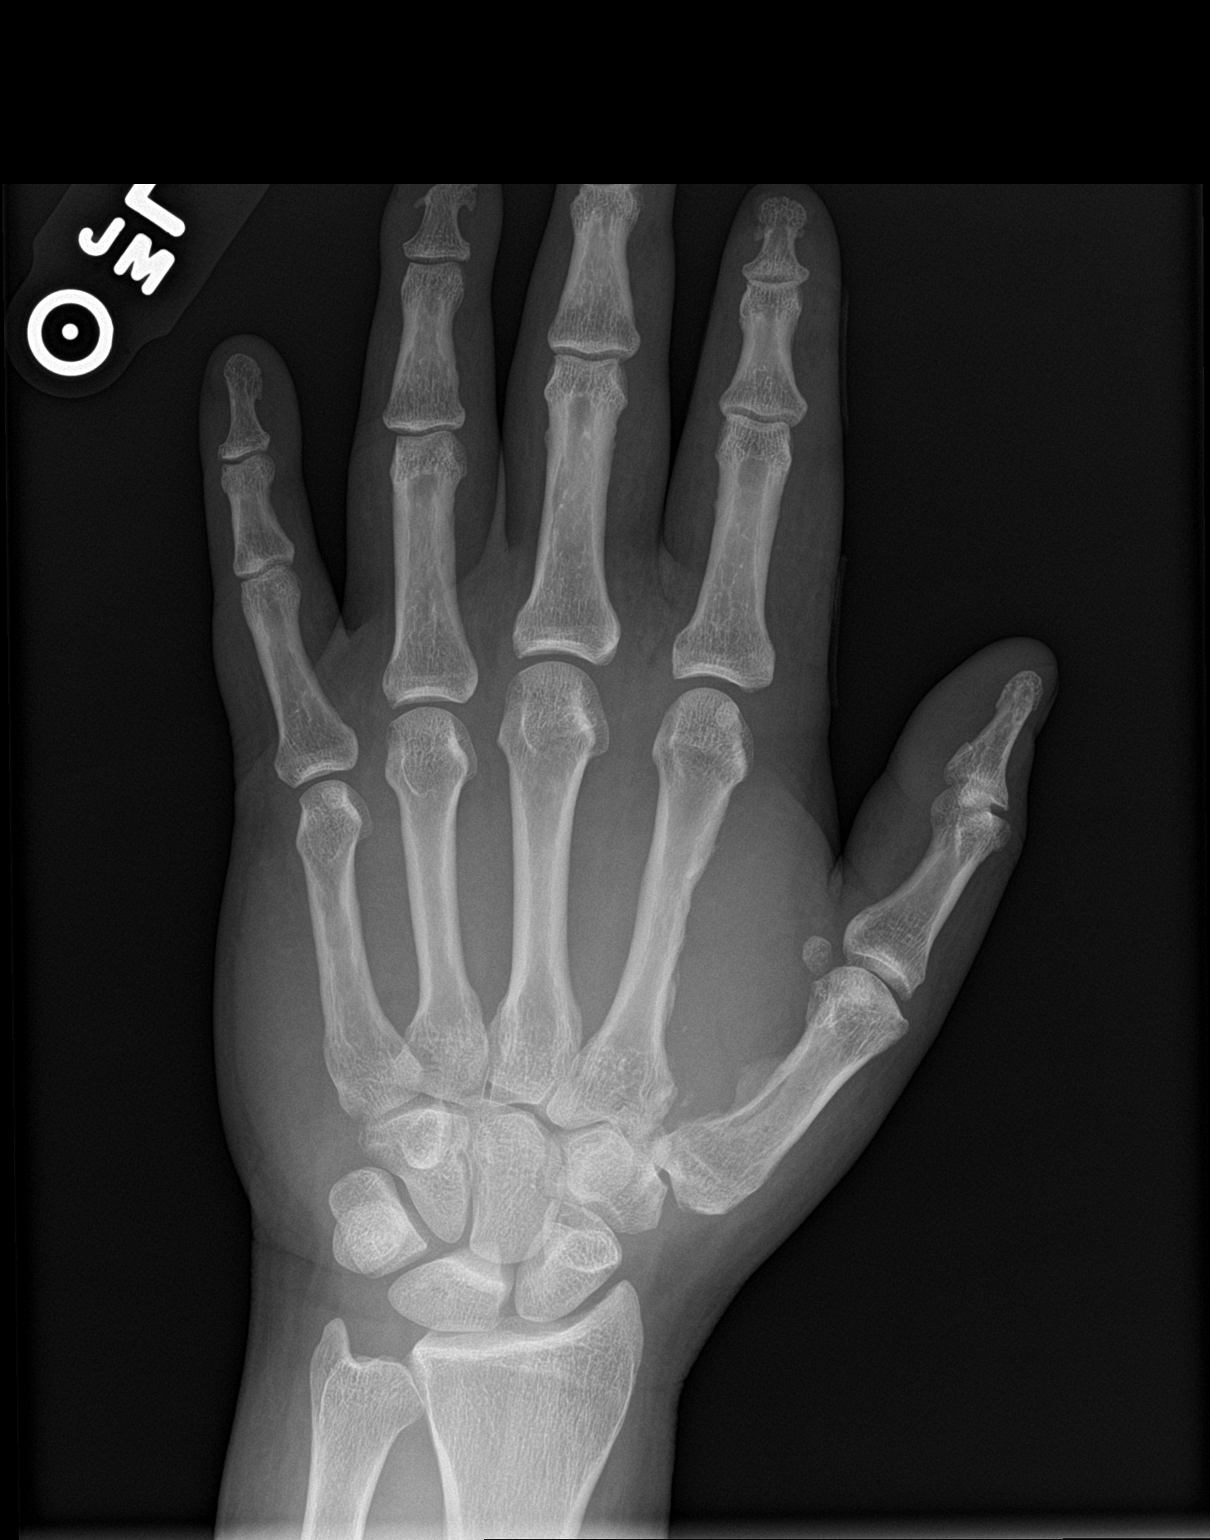

[hand obl]
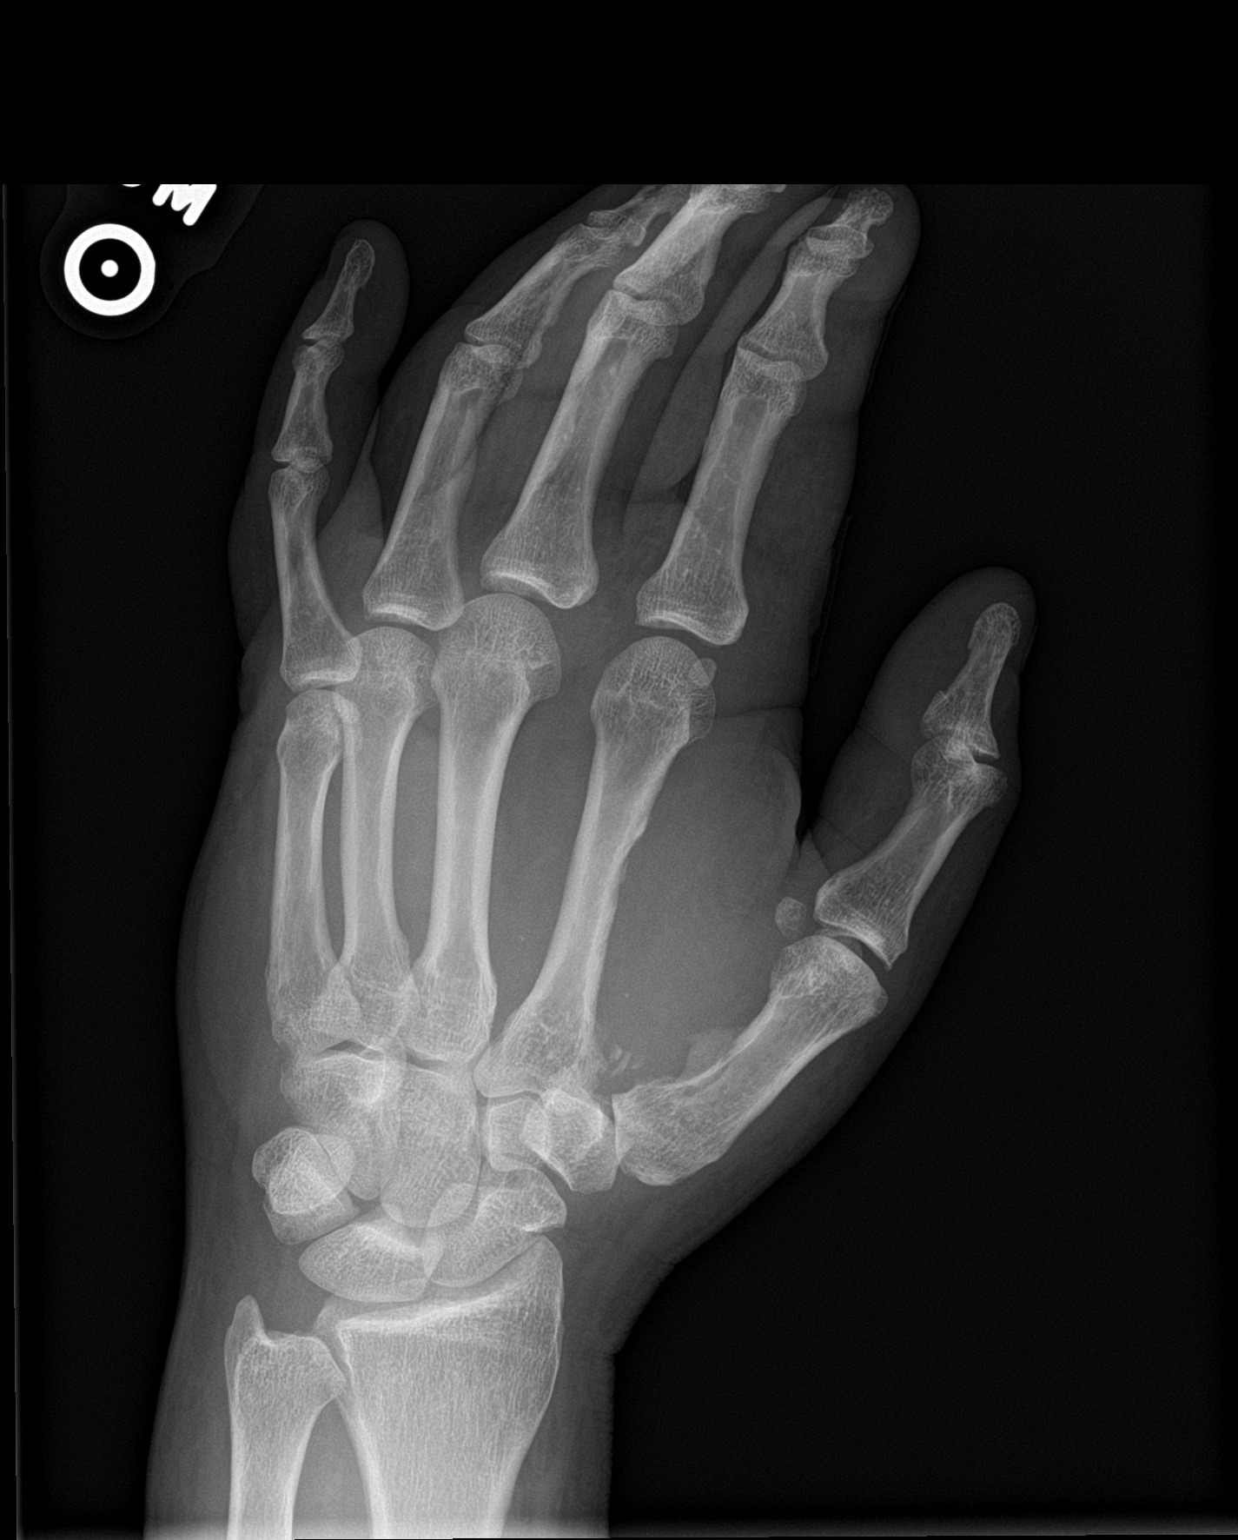

[hand lat]
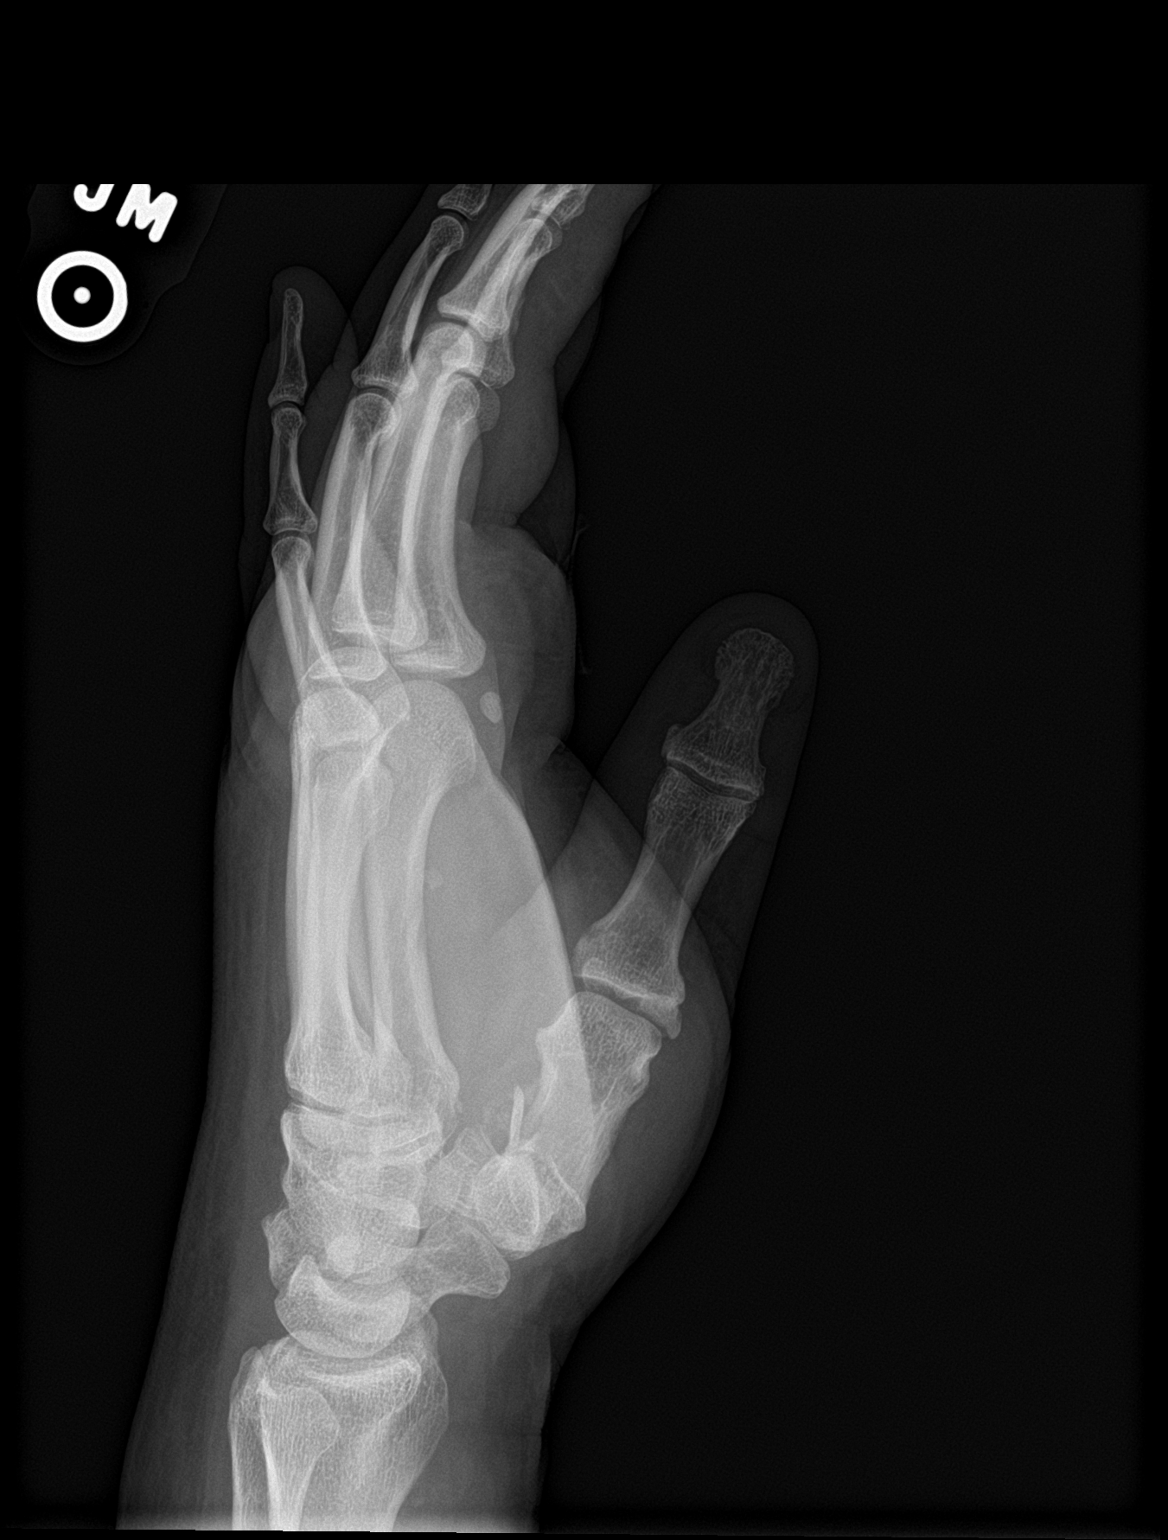

[3 of 3 positions shown; findings below may reference images not displayed]

FINDINGS: Fracture fragments and/or foreign debris about the adjacent bases of
the left first and second metacarpals. No other fracture or
dislocation of the left hand or wrist. Joint spaces are preserved.
The carpus is normally aligned. Diffuse soft tissue edema about the
hand.
IMPRESSION: 1. Fracture fragments and/or foreign debris about the adjacent bases
of the left first and second metacarpals.

2. No other fracture or dislocation of the left hand or wrist. Joint
spaces are preserved. The carpus is normally aligned.

3.  Diffuse soft tissue edema about the hand.

## 2021-10-12 IMAGING — MR MR [PERSON_NAME]*[PERSON_NAME]* W/O CM
5 series · 36 of 40 positions shown · non-contrast
Comparison: Radiographs 09/30/2019

CLINICAL DATA: History of laceration from a router. Injury near the
base of the thumb. Persistent hand swelling and redness. The thumb,
index and middle fingers are numb.

EXAM:
MRI OF THE LEFT HAND WITHOUT CONTRAST
TECHNIQUE: Multiplanar, multisequence MR imaging of the left hand was
performed. No intravenous contrast was administered.

[Series 6: T1 · axial · 4.0mm · 0.59mm/px · z∈[-45,+156]mm · 8 of 47 slices shown (1 of 2)]
[im 1/47]
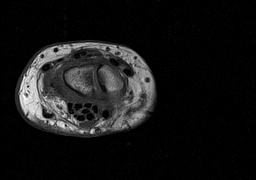
[im 6/47]
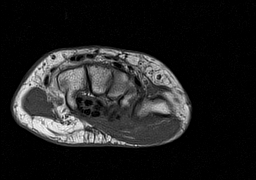
[im 16/47]
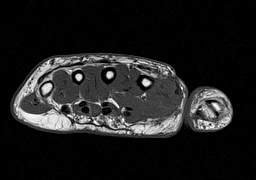
[im 21/47]
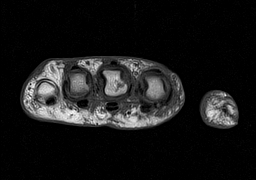
[im 26/47]
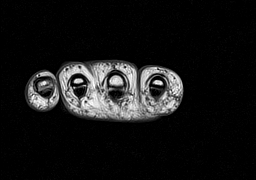
[im 31/47]
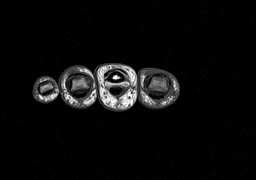
[im 41/47]
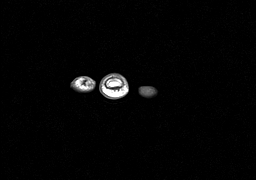
[im 47/47]
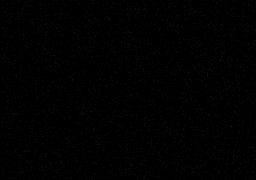

[Series 7: T2 fat-sat · axial · 4.0mm · 0.59mm/px · z∈[-45,+156]mm · 11 of 47 slices shown]
[im 1/47]
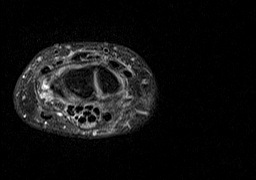
[im 5/47]
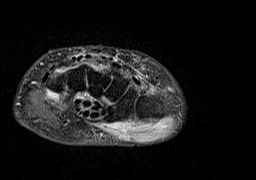
[im 10/47]
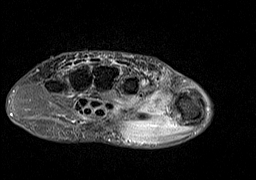
[im 14/47]
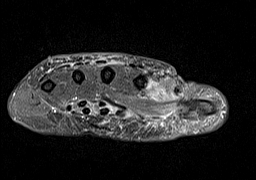
[im 19/47]
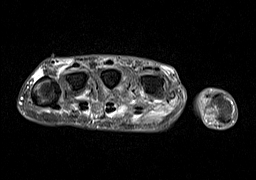
[im 24/47]
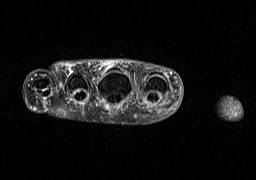
[im 28/47]
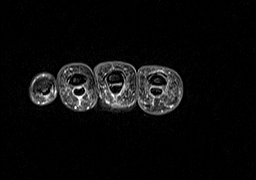
[im 33/47]
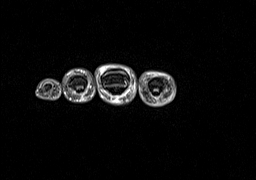
[im 37/47]
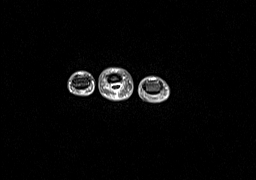
[im 42/47]
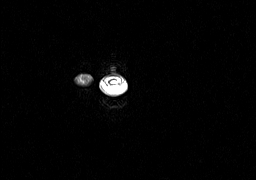
[im 47/47]
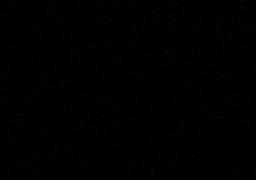

[Series 8: T1 · coronal · 3.0mm · 0.78mm/px · 5 of 21 slices shown (2 of 2)]
[im 1/21]
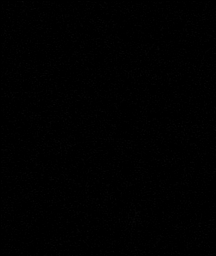
[im 6/21]
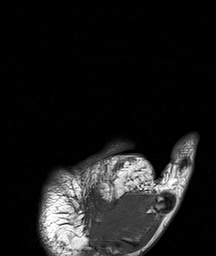
[im 11/21]
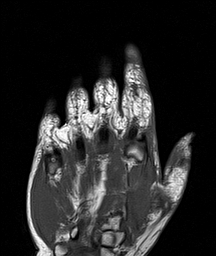
[im 16/21]
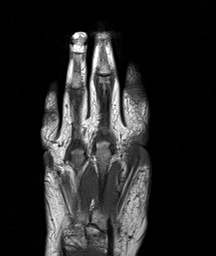
[im 21/21]
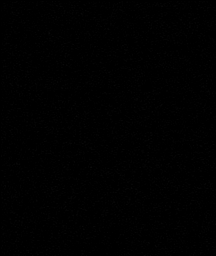

[Series 9: STIR fat-sat · coronal · 3.0mm · 0.78mm/px · 4 of 20 slices shown]
[im 1/20]
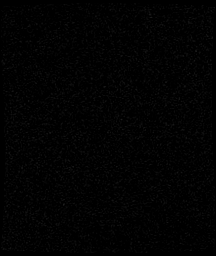
[im 7/20]
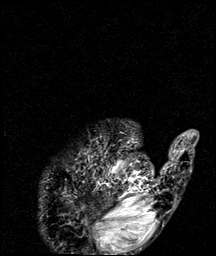
[im 13/20]
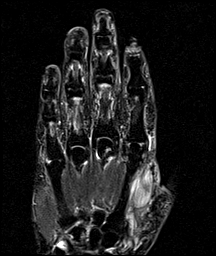
[im 20/20]
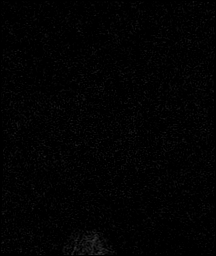

[Series 10: PD fat-sat · sagittal · 3.0mm · 0.31mm/px · 8 of 43 slices shown]
[im 1/43]
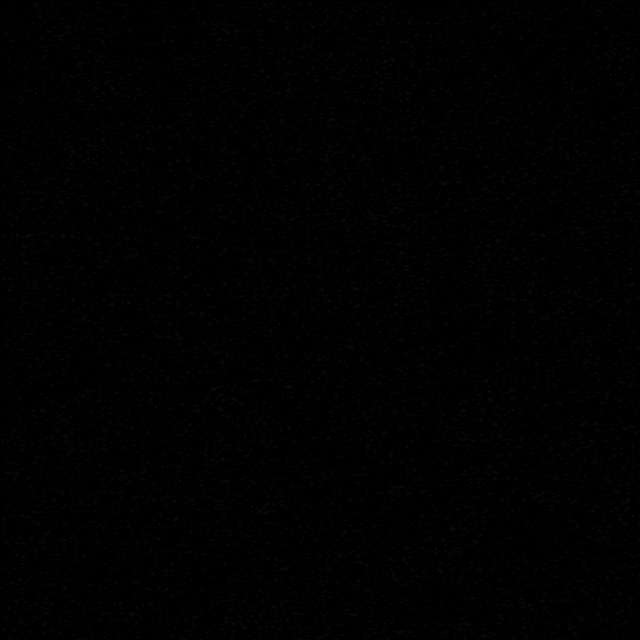
[im 5/43]
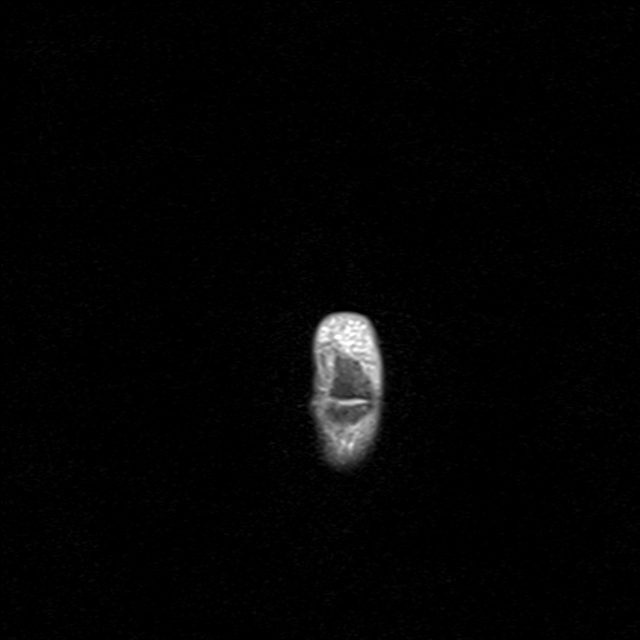
[im 15/43]
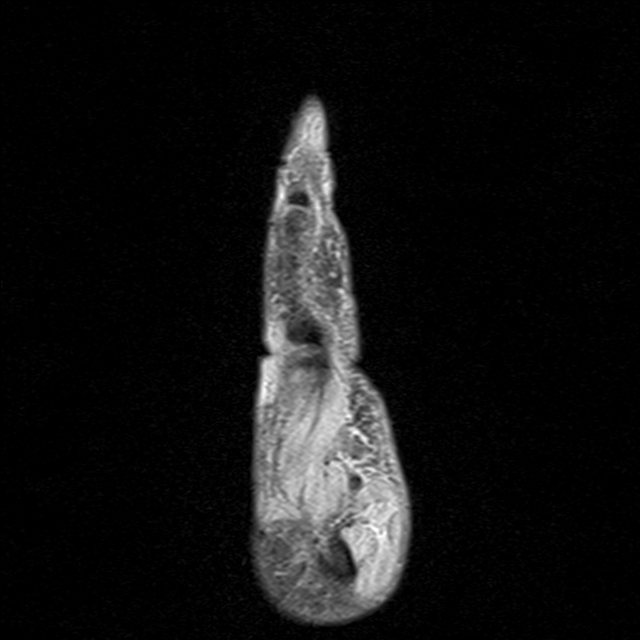
[im 19/43]
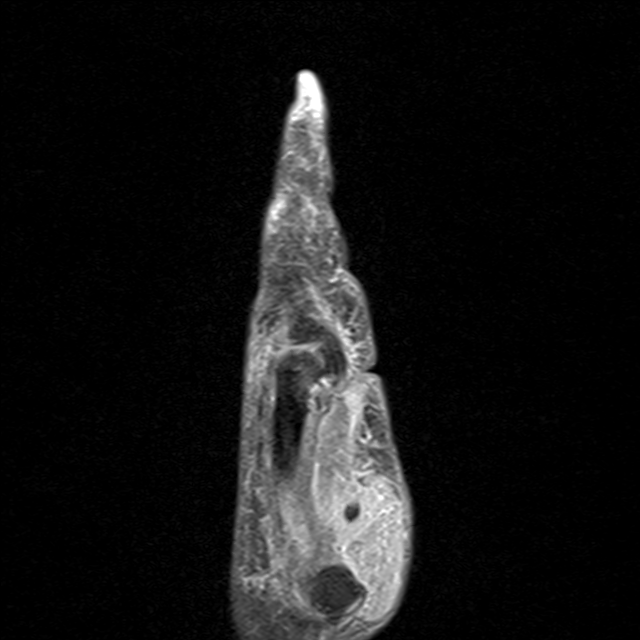
[im 24/43]
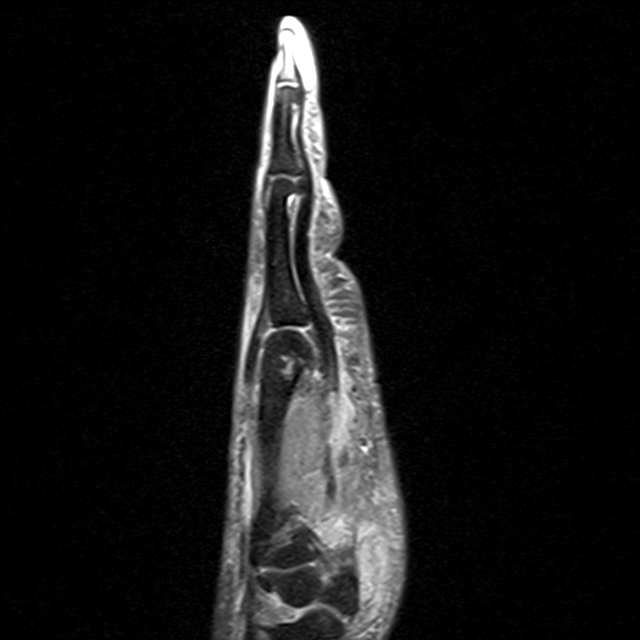
[im 29/43]
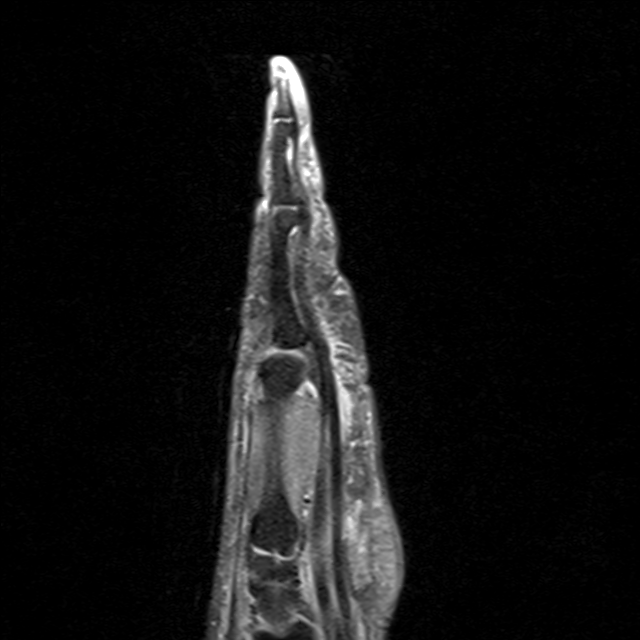
[im 38/43]
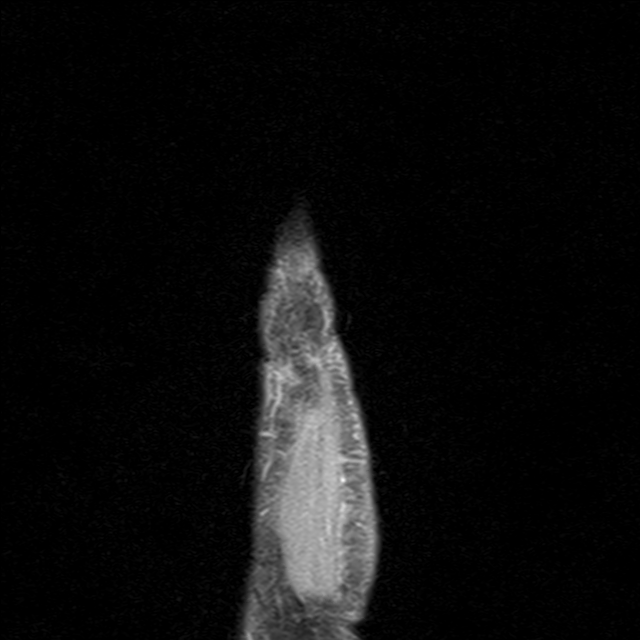
[im 43/43]
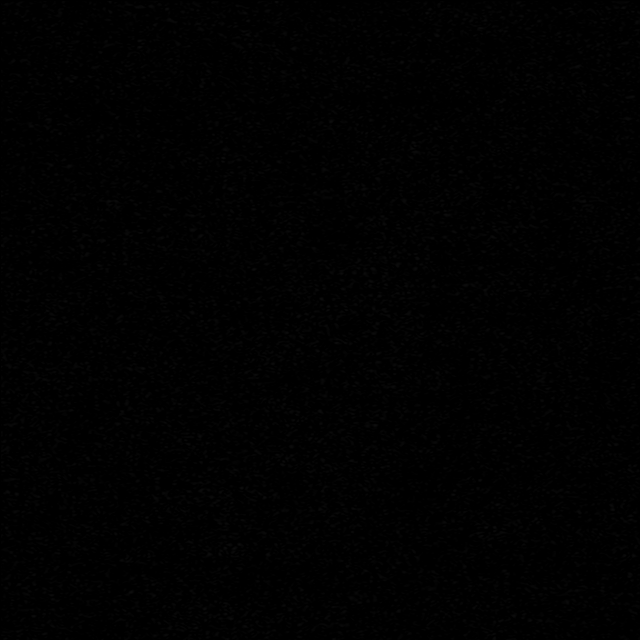

[36 of 40 positions shown; findings below may reference images not displayed]

FINDINGS: No acute bony findings are identified. I do not see a definite
fracture, bone contusion or marrow edema. Small calcifications are
noted on the plain films between the bases of the thumb and index
finger which could be small bone fragments or remote trauma.

The joint spaces are maintained. I do not see any findings
suspicious for septic arthritis. No erosive changes are identified.
Subchondral cystic type changes are noted in the third metacarpal
head.

There is fairly extensive and significant edema like signal changes
involving the thenar and other musculature on the radial aspect of
the hand. I do not see any definite findings for pyomyositis or
intramuscular hematoma.

Mild subcutaneous soft tissue swelling/edema/fluid mainly along the
radial aspect of the hand and wrist suggesting cellulitis without
discrete abscess.

The major tendons and ligaments appear intact. The carpal tunnel
structures appear normal. The median nerve is grossly normal.
IMPRESSION: 1. No definite MR findings for septic arthritis or osteomyelitis.
2. Extensive and significant edema like signal abnormality involving
the thenar and other musculature on the radial aspect of the hand.
This is likely inflammatory or infectious myositis. No findings for
pyomyositis or discrete hematoma.
3. Small calcifications noted on plain films between the bases of
the thumb and index finger which could be small bone fragments or
remote trauma.
4. Mild subcutaneous soft tissue swelling/edema/fluid mainly along
the radial aspect of the hand and wrist suggesting cellulitis
without discrete drainable abscess.

## 2022-02-16 ENCOUNTER — Ambulatory Visit (INDEPENDENT_AMBULATORY_CARE_PROVIDER_SITE_OTHER): Payer: BC Managed Care – PPO | Admitting: Family Medicine

## 2022-02-16 ENCOUNTER — Encounter: Payer: Self-pay | Admitting: Family Medicine

## 2022-02-16 VITALS — BP 119/74 | HR 72 | Temp 98.1°F | Ht 75.0 in | Wt 243.0 lb

## 2022-02-16 DIAGNOSIS — Z789 Other specified health status: Secondary | ICD-10-CM

## 2022-02-16 DIAGNOSIS — R7303 Prediabetes: Secondary | ICD-10-CM | POA: Diagnosis not present

## 2022-02-16 DIAGNOSIS — Z23 Encounter for immunization: Secondary | ICD-10-CM

## 2022-02-16 DIAGNOSIS — Z125 Encounter for screening for malignant neoplasm of prostate: Secondary | ICD-10-CM | POA: Diagnosis not present

## 2022-02-16 DIAGNOSIS — E559 Vitamin D deficiency, unspecified: Secondary | ICD-10-CM

## 2022-02-16 DIAGNOSIS — E785 Hyperlipidemia, unspecified: Secondary | ICD-10-CM | POA: Diagnosis not present

## 2022-02-16 DIAGNOSIS — E538 Deficiency of other specified B group vitamins: Secondary | ICD-10-CM | POA: Diagnosis not present

## 2022-02-16 DIAGNOSIS — N1831 Chronic kidney disease, stage 3a: Secondary | ICD-10-CM

## 2022-02-16 DIAGNOSIS — Z0000 Encounter for general adult medical examination without abnormal findings: Secondary | ICD-10-CM | POA: Diagnosis not present

## 2022-02-16 DIAGNOSIS — E669 Obesity, unspecified: Secondary | ICD-10-CM

## 2022-02-16 LAB — CBC WITH DIFFERENTIAL/PLATELET
Basophils Absolute: 0.1 10*3/uL (ref 0.0–0.1)
Basophils Relative: 1.1 % (ref 0.0–3.0)
Eosinophils Absolute: 0.1 10*3/uL (ref 0.0–0.7)
Eosinophils Relative: 2.9 % (ref 0.0–5.0)
HCT: 47 % (ref 39.0–52.0)
Hemoglobin: 15.5 g/dL (ref 13.0–17.0)
Lymphocytes Relative: 23.2 % (ref 12.0–46.0)
Lymphs Abs: 1 10*3/uL (ref 0.7–4.0)
MCHC: 33 g/dL (ref 30.0–36.0)
MCV: 88.5 fl (ref 78.0–100.0)
Monocytes Absolute: 0.4 10*3/uL (ref 0.1–1.0)
Monocytes Relative: 9.1 % (ref 3.0–12.0)
Neutro Abs: 2.8 10*3/uL (ref 1.4–7.7)
Neutrophils Relative %: 63.7 % (ref 43.0–77.0)
Platelets: 162 10*3/uL (ref 150.0–400.0)
RBC: 5.31 Mil/uL (ref 4.22–5.81)
RDW: 14.8 % (ref 11.5–15.5)
WBC: 4.4 10*3/uL (ref 4.0–10.5)

## 2022-02-16 LAB — COMPREHENSIVE METABOLIC PANEL
ALT: 13 U/L (ref 0–53)
AST: 13 U/L (ref 0–37)
Albumin: 4.5 g/dL (ref 3.5–5.2)
Alkaline Phosphatase: 45 U/L (ref 39–117)
BUN: 21 mg/dL (ref 6–23)
CO2: 29 mEq/L (ref 19–32)
Calcium: 9.9 mg/dL (ref 8.4–10.5)
Chloride: 102 mEq/L (ref 96–112)
Creatinine, Ser: 1.54 mg/dL — ABNORMAL HIGH (ref 0.40–1.50)
GFR: 46.74 mL/min — ABNORMAL LOW (ref >60.00)
Glucose, Bld: 98 mg/dL (ref 70–99)
Potassium: 4.5 mEq/L (ref 3.5–5.1)
Sodium: 139 mEq/L (ref 135–145)
Total Bilirubin: 0.6 mg/dL (ref 0.2–1.2)
Total Protein: 7 g/dL (ref 6.0–8.3)

## 2022-02-16 LAB — VITAMIN B12: Vitamin B-12: 1038 pg/mL — ABNORMAL HIGH (ref 211–911)

## 2022-02-16 LAB — TSH: TSH: 2.38 u[IU]/mL (ref 0.35–5.50)

## 2022-02-16 LAB — LIPID PANEL
Cholesterol: 279 mg/dL — ABNORMAL HIGH (ref 0–200)
HDL: 42.4 mg/dL (ref >39.00)
LDL Cholesterol: 208 mg/dL — ABNORMAL HIGH (ref 0–99)
NonHDL: 236.21
Total CHOL/HDL Ratio: 7
Triglycerides: 140 mg/dL (ref 0.0–149.0)
VLDL: 28 mg/dL (ref 0.0–40.0)

## 2022-02-16 LAB — VITAMIN D 25 HYDROXY (VIT D DEFICIENCY, FRACTURES): VITD: 78.53 ng/mL (ref 30.00–100.00)

## 2022-02-16 LAB — PSA: PSA: 1.23 ng/mL (ref 0.10–4.00)

## 2022-02-16 LAB — HEMOGLOBIN A1C: Hgb A1c MFr Bld: 6 % (ref 4.6–6.5)

## 2022-02-16 NOTE — Patient Instructions (Addendum)
No follow-ups on file.        Great to see you today.  I have refilled the medication(s) we provide.   If labs were collected, we will inform you of lab results once received either by echart message or telephone call.   - echart message- for normal results that have been seen by the patient already.   - telephone call: abnormal results or if patient has not viewed results in their echart.   [facility] Address:  [address], [place], [place] [address] Phone: [phone]  Health Maintenance, Male Adopting a healthy lifestyle and getting preventive care are important in promoting health and wellness. Ask your health care provider about: The right schedule for you to have regular tests and exams. Things you can do on your own to prevent diseases and keep yourself healthy. What should I know about diet, weight, and exercise? Eat a healthy diet  Eat a diet that includes plenty of vegetables, fruits, low-fat dairy products, and lean protein. Do not eat a lot of foods that are high in solid fats, added sugars, or sodium. Maintain a healthy weight Body mass index (BMI) is a measurement that can be used to identify possible weight problems. It estimates body fat based on height and weight. Your health care provider can help determine your BMI and help you achieve or maintain a healthy weight. Get regular exercise Get regular exercise. This is one of the most important things you can do for your health. Most adults should: Exercise for at least 150 minutes each week. The exercise should increase your heart rate and make you sweat (moderate-intensity exercise). Do strengthening exercises at least twice a week. This is in addition to the moderate-intensity exercise. Spend less time sitting. Even light physical activity can be beneficial. Watch cholesterol and blood lipids Have your blood tested for lipids and cholesterol at 66 years of age, then have this test every 5  years. You may need to have your cholesterol levels checked more often if: Your lipid or cholesterol levels are high. You are older than 66 years of age. You are at high risk for heart disease. What should I know about cancer screening? Many types of cancers can be detected early and may often be prevented. Depending on your health history and family history, you may need to have cancer screening at various ages. This may include screening for: Colorectal cancer. Prostate cancer. Skin cancer. Lung cancer. What should I know about heart disease, diabetes, and high blood pressure? Blood pressure and heart disease High blood pressure causes heart disease and increases the risk of stroke. This is more likely to develop in people who have high blood pressure readings or are overweight. Talk with your health care provider about your target blood pressure readings. Have your blood pressure checked: Every 3-5 years if you are 92-47 years of age. Every year if you are 19 years old or older. If you are between the ages of 33 and 48 and are a current or former smoker, ask your health care provider if you should have a one-time screening for abdominal aortic aneurysm (AAA). Diabetes Have regular diabetes screenings. This checks your fasting blood sugar level. Have the screening done: Once every three years after age 71 if you are at a normal weight and have a low risk for diabetes. More often and at a younger age if you are overweight or have a high risk for diabetes. What should I know about preventing infection?  Hepatitis B If you have a higher risk for hepatitis B, you should be screened for this virus. Talk with your health care provider to find out if you are at risk for hepatitis B infection. Hepatitis C Blood testing is recommended for: Everyone born from [birth year] through 1965. Anyone with known risk factors for hepatitis C. Sexually transmitted infections (STIs) You should be screened each  year for STIs, including gonorrhea and chlamydia, if: You are sexually active and are younger than 66 years of age. You are older than 66 years of age and your health care provider tells you that you are at risk for this type of infection. Your sexual activity has changed since you were last screened, and you are at increased risk for chlamydia or gonorrhea. Ask your health care provider if you are at risk. Ask your health care provider about whether you are at high risk for HIV. Your health care provider may recommend a prescription medicine to help prevent HIV infection. If you choose to take medicine to prevent HIV, you should first get tested for HIV. You should then be tested every 3 months for as long as you are taking the medicine. Follow these instructions at home: Alcohol use Do not drink alcohol if your health care provider tells you not to drink. If you drink alcohol: Limit how much you have to 0-2 drinks a day. Know how much alcohol is in your drink. In the [place] one drink equals one 12 oz bottle of beer (355 mL), one 5 oz glass of wine (148 mL), or one 1 oz glass of hard liquor (44 mL). Lifestyle Do not use any products that contain nicotine or tobacco. These products include cigarettes, chewing tobacco, and vaping devices, such as e-cigarettes. If you need help quitting, ask your health care provider. Do not use street drugs. Do not share needles. Ask your health care provider for help if you need support or information about quitting drugs. General instructions Schedule regular health, dental, and eye exams. Stay current with your vaccines. Tell your health care provider if: You often feel depressed. You have ever been abused or do not feel safe at home. Summary Adopting a healthy lifestyle and getting preventive care are important in promoting health and wellness. Follow your health care provider's instructions about healthy diet, exercising, and getting tested or screened  for diseases. Follow your health care provider's instructions on monitoring your cholesterol and blood pressure. This information is not intended to replace advice given to you by your health care provider. Make sure you discuss any questions you have with your health care provider. Document Revised: 11/22/2020 Document Reviewed: 11/22/2020 Elsevier Patient Education  [place].

## 2022-02-16 NOTE — Progress Notes (Signed)
Patient ID: Javier Casey, male  DOB: 1955-10-19, 66 y.o.   MRN: [id number] Patient Care Team    Relationship Specialty Notifications Start End  Ma Hillock, DO PCP - General Family Medicine  08/13/15   Pyrtle, Lajuan Lines, MD Consulting Physician Gastroenterology  02/18/16   Lewis Moccasin, MD Referring Physician Surgery  02/16/22     Chief Complaint  Patient presents with   Annual Exam    Pt is fasting    Subjective: Javier Casey is a 66 y.o. male present for CPE. All past medical history, surgical history, allergies, family history, immunizations, medications and social history were updated in the electronic medical record today. All recent labs, ED visits and hospitalizations within the last year were reviewed.  Health maintenance:  Colonoscopy: 2020, polyps and diverticulosis.  Post partial colectomy/colostomy/reanastomosis-  Dr. Hilarie Fredrickson. No fhx. Immunizations:  tdap UTD 2021, influenza  (encouraged yearly), prevna20 completed.. shingrix #1 completed-declined2. Infectious disease screening: HIV and  Hep C completed PSA: No fhx.  Lab Results  Component Value Date   PSA 2.08 05/07/2019   PSA 1.22 02/18/2016   PSA 0.83 02/26/2008  , pt was counseled on prostate cancer screenings.  Assistive device: None Oxygen use: None* Patient has a Dental home. Hospitalizations/ED visits: Reviewed     02/16/2022    8:31 AM 05/07/2019    2:03 PM 03/01/2018    5:40 PM 02/23/2017    1:52 PM 02/18/2016    3:20 PM  Depression screen PHQ 2/9  Decreased Interest 0 0 0 0 0  Down, Depressed, Hopeless 0 0 0 0 0  PHQ - 2 Score 0 0 0 0 0       No data to display                 02/16/2022    8:31 AM 03/01/2018    5:40 PM 02/23/2017    1:52 PM 02/18/2016    3:20 PM  Fall Risk   Falls in the past year? 0 No No No  Number falls in past yr: 0     Injury with Fall? 0     Risk for fall due to : No Fall Risks     Follow up Falls evaluation completed       Immunization History   Administered Date(s) Administered   Influenza,inj,Quad PF,6+ Mos 04/30/2013, 04/13/2014, 04/18/2019   Influenza-Unspecified 06/06/2021   MODERNA COVID-19 SARS-COV-2 PEDS BIVALENT BOOSTER 6Y-11Y 11/10/2019, 12/08/2019, 06/06/2021   PNEUMOCOCCAL CONJUGATE-20 02/16/2022   Pneumococcal Conjugate-13 04/13/2014   Td 02/14/2006, 08/02/2019   Tdap 01/31/2016   Zoster Recombinat (Shingrix) 05/07/2019   Past Medical History:  Diagnosis Date   Allergy    swelling lips ,nose, hands and feet, tongue, but unknown reason   Asthma    Colon polyps    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   removed   Diverticulitis    Diverticulosis 2016   There was severe diverticulosis noted in the ascending to sigmoid colon   Epididymal cyst 02/2016   Multiple on scrotal u/s   GERD (gastroesophageal reflux disease)    Hyperlipidemia    Hypertension    Hyperuricemia    Tubular adenoma of colon 2009; 2016   Allergies  Allergen Reactions   Ciprofloxacin     Severe swelling and rash   Levofloxacin     Diffuse swelling with angioedema   Pravastatin Other (See Comments)    myalgia   Lipitor [Atorvastatin] Other (See Comments)  Myalgia    Past Surgical History:  Procedure Laterality Date   APPENDECTOMY     CARPAL TUNNEL RELEASE Left 10/27/2019   Procedure: CARPAL TUNNEL RELEASE;  Surgeon: Milly Jakob, MD;  Location: [facility];  Service: Orthopedics;  Laterality: Left;   colonoscopy with polypectomy  2009; 12/01/2014   [facility].  +Diverticulosis   NERVE REPAIR Left 10/27/2019   Procedure: LEFT HAND MEDIAN NEUROPLASTY;  Surgeon: Milly Jakob, MD;  Location: [facility];  Service: Orthopedics;  Laterality: Left;  SURGERY: LEFT HAND MEDIAN NEUROPLASTY WITH POSSIBLE NERVE GRAFTING  LENGTH: 2 HOURS PRE OP BLOCK   PARTIAL COLECTOMY  10/2018   Due to severe diverticulosis with bladder fistula   WISDOM TOOTH EXTRACTION     Family History  Problem Relation Age of Onset    Coronary artery disease Mother 36       died post CBAG   Allergic Disorder Daughter    Allergic Disorder Son    Cancer Neg Hx    Stroke Neg Hx    Diabetes Neg Hx    Colon cancer Neg Hx    Colon polyps Neg Hx    Esophageal cancer Neg Hx    Stomach cancer Neg Hx    Pancreatic cancer Neg Hx    Liver disease Neg Hx    Inflammatory bowel disease Neg Hx    Social History   Social History Narrative   Not on file    Allergies as of 02/16/2022       Reactions   Ciprofloxacin    Severe swelling and rash   Levofloxacin    Diffuse swelling with angioedema   Pravastatin Other (See Comments)   myalgia   Lipitor [atorvastatin] Other (See Comments)   Myalgia         Medication List        Accurate as of February 16, 2022 12:31 PM. If you have any questions, ask your nurse or doctor.          STOP taking these medications    acetaminophen 325 MG tablet Commonly known as: Tylenol Stopped by: Howard Pouch, DO   ibuprofen 200 MG tablet Commonly known as: Advil Stopped by: Howard Pouch, DO   losartan-hydrochlorothiazide 50-12.5 MG tablet Commonly known as: HYZAAR Stopped by: Howard Pouch, DO   oxyCODONE 5 MG immediate release tablet Commonly known as: Roxicodone Stopped by: Howard Pouch, DO       TAKE these medications    B-12 2500 MCG Tabs Take by mouth daily.   OMEGA-3 2100 PO Take by mouth.   Vitamin D3 250 MCG (10000 UT) capsule Take 10,000 Units by mouth daily.       All past medical history, surgical history, allergies, family history, immunizations andmedications were updated in the EMR today and reviewed under the history and medication portions of their EMR.     No results found for this or any previous visit (from the past 2160 hour(s)).  No results found.  ROS 14 pt review of systems performed and negative (unless mentioned in an HPI)  Objective: BP 119/74   Pulse 72   Temp 98.1 F (36.7 C) (Oral)   Ht '6\' 3"'$  (1.905 m)   Wt 243 lb  (110.2 kg)   SpO2 99%   BMI 30.37 kg/m  Physical Exam Constitutional:      General: He is not in acute distress.    Appearance: Normal appearance. He is obese. He is not ill-appearing, toxic-appearing or diaphoretic.  HENT:     Head: Normocephalic and atraumatic.     Right Ear: Tympanic membrane, ear canal and external ear normal. There is no impacted cerumen.     Left Ear: Tympanic membrane, ear canal and external ear normal. There is no impacted cerumen.     Nose: Nose normal. No congestion or rhinorrhea.     Mouth/Throat:     Mouth: Mucous membranes are moist.     Pharynx: Oropharynx is clear. No oropharyngeal exudate or posterior oropharyngeal erythema.  Eyes:     General: No scleral icterus.       Right eye: No discharge.        Left eye: No discharge.     Extraocular Movements: Extraocular movements intact.     Pupils: Pupils are equal, round, and reactive to light.  Cardiovascular:     Rate and Rhythm: Normal rate and regular rhythm.     Pulses: Normal pulses.     Heart sounds: Normal heart sounds. No murmur heard.    No friction rub. No gallop.  Pulmonary:     Effort: Pulmonary effort is normal. No respiratory distress.     Breath sounds: Normal breath sounds. No stridor. No wheezing, rhonchi or rales.  Chest:     Chest wall: No tenderness.  Abdominal:     General: Abdomen is flat. Bowel sounds are normal. There is no distension.     Palpations: Abdomen is soft. There is no mass.     Tenderness: There is no abdominal tenderness. There is no right CVA tenderness, left CVA tenderness, guarding or rebound.     Hernia: No hernia is present.  Musculoskeletal:        General: No swelling or tenderness. Normal range of motion.     Cervical back: Normal range of motion and neck supple.     Right lower leg: No edema.     Left lower leg: No edema.  Lymphadenopathy:     Cervical: No cervical adenopathy.  Skin:    General: Skin is warm and dry.     Coloration: Skin is not  jaundiced.     Findings: No bruising, lesion or rash.  Neurological:     General: No focal deficit present.     Mental Status: He is alert and oriented to person, place, and time. Mental status is at baseline.     Cranial Nerves: No cranial nerve deficit.     Sensory: No sensory deficit.     Motor: No weakness.     Coordination: Coordination normal.     Gait: Gait normal.     Deep Tendon Reflexes: Reflexes normal.  Psychiatric:        Mood and Affect: Mood normal.        Behavior: Behavior normal.        Thought Content: Thought content normal.        Judgment: Judgment normal.    No results found.  Assessment/plan: Javier Casey is a 66 y.o. male present for CPE Hyperlipidemia LDL goal <130 /Statin intolerance/Obesity (BMI 30-39.9) - CBC with Differential/Platelet - Comprehensive metabolic panel - Lipid panel - TSH Vitamin B12 deficiency - B12 Stage 3a chronic kidney disease (HCC)/Vitamin D deficiency - Vitamin D (25 hydroxy) Prediabetes - Hemoglobin A1c Need for prophylactic vaccination against Streptococcus pneumoniae (pneumococcus) - Pneumococcal conjugate vaccine 20-valent (Prevnar 20) Prostate cancer screening - PSA  Routine general yearly exam at a health care facility Colonoscopy: 2016 polyps and diverticulosis.  Post partial colectomy/colostomy/reanastomosis 2020,  has not had a colonoscopy since 2016-  Dr. Hilarie Fredrickson. No fhx.  Encouraged him to call his gastroenterologist and contact number was provided to him today.  We do not have the surgical report, but I would suspect he is likely overdue for a follow-up colonoscopy at this point. Immunizations:  tdap UTD 2021, influenza  (encouraged yearly), prevna20 completed today. shingrix #1 completed-declined2. Infectious disease screening: HIV and  Hep C completed Patient was encouraged to exercise greater than 150 minutes a week. Patient was encouraged to choose a diet filled with fresh fruits and vegetables, and lean  meats. AVS provided to patient today for education/recommendation on gender specific health and safety maintenance.  Return in about 1 year (around 02/18/2023) for cpe (20 min).   Orders Placed This Encounter  Procedures   Pneumococcal conjugate vaccine 20-valent (Prevnar 20)   CBC with Differential/Platelet   Comprehensive metabolic panel   Hemoglobin A1c   Lipid panel   TSH   Vitamin D (25 hydroxy)   PSA   B12   No orders of the defined types were placed in this encounter.  Referral Orders  No referral(s) requested today    Note is dictated utilizing voice recognition software. Although note has been proof read prior to signing, occasional typographical errors still can be missed. If any questions arise, please do not hesitate to call for verification.  Electronically signed by: Howard Pouch, DO [place]

## 2022-02-17 ENCOUNTER — Telehealth: Payer: Self-pay | Admitting: Family Medicine

## 2022-02-17 NOTE — Telephone Encounter (Signed)
Please call patient Liver and thyroids function are normal Kidney function is stable from prior collections. Blood cell counts and electrolytes are normal Vitamin B12 levels are normal. Vitamin D levels are normal, on the higher end range.  Vitamin D can be toxic at higher levels, would recommend he skip a dose a week. Diabetes screening/A1c is mildly elevated from last collection.  His A1c is 6.0 now, was 5.8.  His fasting glucose is normal which is reassuring.   Cholesterol panel is better than when collected 3 years ago, but still extremely high LDL/bad cholesterol.  He is statin intolerant.  I would recommend referral to cardiology, as they have other treatments for cholesterol they can discuss with him that are not statins. -By calculation, his cardiovascular risk of heart attack or stroke in the next 10 years is 16.5% for event.  This is a significant risk.  If agreeable to referral, please place referral to cardiology for him.  Thanks

## 2022-02-17 NOTE — Telephone Encounter (Signed)
Spoke with pt regarding labs and instructions. Pt states he is not concerned of HLD due to his eating habits. Pt declines referral.

## 2022-05-19 ENCOUNTER — Encounter: Payer: Self-pay | Admitting: Family Medicine

## 2022-10-24 DIAGNOSIS — M50122 Cervical disc disorder at C5-C6 level with radiculopathy: Secondary | ICD-10-CM | POA: Diagnosis not present

## 2022-10-24 DIAGNOSIS — M4722 Other spondylosis with radiculopathy, cervical region: Secondary | ICD-10-CM | POA: Diagnosis not present

## 2022-10-24 DIAGNOSIS — M5114 Intervertebral disc disorders with radiculopathy, thoracic region: Secondary | ICD-10-CM | POA: Diagnosis not present

## 2023-02-12 ENCOUNTER — Ambulatory Visit: Payer: BC Managed Care – PPO | Admitting: Family Medicine

## 2023-02-16 DIAGNOSIS — H40003 Preglaucoma, unspecified, bilateral: Secondary | ICD-10-CM | POA: Diagnosis not present

## 2023-02-16 DIAGNOSIS — H5213 Myopia, bilateral: Secondary | ICD-10-CM | POA: Diagnosis not present

## 2023-02-20 ENCOUNTER — Ambulatory Visit (INDEPENDENT_AMBULATORY_CARE_PROVIDER_SITE_OTHER): Payer: BC Managed Care – PPO | Admitting: Family Medicine

## 2023-02-20 ENCOUNTER — Encounter: Payer: Self-pay | Admitting: Family Medicine

## 2023-02-20 VITALS — BP 128/77 | HR 65 | Temp 98.0°F | Ht 75.0 in | Wt 248.4 lb

## 2023-02-20 DIAGNOSIS — Z0000 Encounter for general adult medical examination without abnormal findings: Secondary | ICD-10-CM | POA: Diagnosis not present

## 2023-02-20 DIAGNOSIS — E785 Hyperlipidemia, unspecified: Secondary | ICD-10-CM

## 2023-02-20 DIAGNOSIS — G629 Polyneuropathy, unspecified: Secondary | ICD-10-CM | POA: Insufficient documentation

## 2023-02-20 DIAGNOSIS — N1831 Chronic kidney disease, stage 3a: Secondary | ICD-10-CM | POA: Diagnosis not present

## 2023-02-20 DIAGNOSIS — R7303 Prediabetes: Secondary | ICD-10-CM | POA: Diagnosis not present

## 2023-02-20 DIAGNOSIS — D126 Benign neoplasm of colon, unspecified: Secondary | ICD-10-CM

## 2023-02-20 DIAGNOSIS — E538 Deficiency of other specified B group vitamins: Secondary | ICD-10-CM

## 2023-02-20 DIAGNOSIS — Z789 Other specified health status: Secondary | ICD-10-CM

## 2023-02-20 DIAGNOSIS — Z125 Encounter for screening for malignant neoplasm of prostate: Secondary | ICD-10-CM | POA: Diagnosis not present

## 2023-02-20 HISTORY — DX: Polyneuropathy, unspecified: G62.9

## 2023-02-20 MED ORDER — GABAPENTIN 100 MG PO CAPS: ORAL_CAPSULE | ORAL | 5 refills | Status: AC

## 2023-02-20 NOTE — Patient Instructions (Addendum)
Return in about 1 year (around 02/21/2024) for cpe (20 min).        Great to see you today.  I have refilled the medication(s) we provide.   If labs were collected or images ordered, we will inform you of  results once we have received them and reviewed. We will contact you either by echart message, or telephone call.  Please give ample time to the testing facility, and our office to run,  receive and review results. Please do not call inquiring of results, even if you can see them in your chart. We will contact you as soon as we are able. If it has been over 1 week since the test was completed, and you have not yet heard from [place], then please call us.    - echart message- for normal results that have been seen by the patient already.   - telephone call: abnormal results or if patient has not viewed results in their echart.  If a referral to a specialist was entered for you, please call us in 2 weeks if you have not heard from the specialist office to schedule.

## 2023-02-20 NOTE — Progress Notes (Signed)
Patient ID: Javier Casey, male  DOB: 10/02/1955, 67 y.o.   MRN: [id number] Patient Care Team    Relationship Specialty Notifications Start End  Natalia Leatherwood, DO PCP - General Family Medicine  08/13/15   Pyrtle, Carie Caddy, MD Consulting Physician Gastroenterology  02/18/16   Chalmers Cater, MD Referring Physician Surgery  02/16/22     Chief Complaint  Patient presents with   Annual Exam    Pt is fasting    Subjective: Javier Casey is a 67 y.o. male present for CPE. All past medical history, surgical history, allergies, family history, immunizations, medications and social history were updated in the electronic medical record today. All recent labs, ED visits and hospitalizations within the last year were reviewed.  Health maintenance:  Colonoscopy: 2020, polyps and diverticulosis.  Post partial colectomy/colostomy/reanastomosis-  Dr. Rhea Belton. No fhx. Unknwn where he falls with follow up now. Referral to GI to discuss.  Immunizations:  tdap UTD 2021, influenza  (encouraged yearly), prevna20 completed.. shingrix #1 completed-declined2. Infectious disease screening: HIV and  Hep C completed PSA: No fhx.  Lab Results  Component Value Date   PSA 1.23 02/16/2022   PSA 2.08 05/07/2019   PSA 1.22 02/18/2016  , pt was counseled on prostate cancer screenings.  Assistive device: None Oxygen use: None Patient has a Dental home. Hospitalizations/ED visits: Reviewed     02/20/2023    8:18 AM 02/16/2022    8:31 AM 05/07/2019    2:03 PM 03/01/2018    5:40 PM 02/23/2017    1:52 PM  Depression screen PHQ 2/9  Decreased Interest 0 0 0 0 0  Down, Depressed, Hopeless 0 0 0 0 0  PHQ - 2 Score 0 0 0 0 0       No data to display                 02/20/2023    8:14 AM 02/16/2022    8:31 AM 03/01/2018    5:40 PM 02/23/2017    1:52 PM 02/18/2016    3:20 PM  Fall Risk   Falls in the past year? 0 0 No No No  Number falls in past yr: 0 0     Injury with Fall? 0 0     Risk for fall due to :  No Fall Risks No Fall Risks     Follow up Falls evaluation completed Falls evaluation completed       Immunization History  Administered Date(s) Administered   Influenza,inj,Quad PF,6+ Mos 04/30/2013, 04/13/2014, 04/18/2019   Influenza-Unspecified 06/06/2021   MODERNA COVID-19 SARS-COV-2 PEDS BIVALENT BOOSTER 66yr-21yr 11/10/2019, 12/08/2019, 06/06/2021   PNEUMOCOCCAL CONJUGATE-20 02/16/2022   Pneumococcal Conjugate-13 04/13/2014   Td 02/14/2006, 08/02/2019   Tdap 01/31/2016   Zoster Recombinant(Shingrix) 05/07/2019   Past Medical History:  Diagnosis Date   Allergy    swelling lips ,nose, hands and feet, tongue, but unknown reason   Asthma    Colon polyps    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   removed   Decreased range of motion of finger of right hand 09/29/2019   Diverticulitis    Diverticulosis 2016   There was severe diverticulosis noted in the ascending to sigmoid colon   Epididymal cyst 02/2016   Multiple on scrotal u/s   GERD (gastroesophageal reflux disease)    Hand trauma, left, sequela 09/29/2019   Hyperlipidemia    Hypertension    Hyperuricemia    Neuropathy 02/20/2023   Tubular adenoma  of colon 2009; 2016   Allergies  Allergen Reactions   Ciprofloxacin     Severe swelling and rash   Levofloxacin     Diffuse swelling with angioedema   Pravastatin Other (See Comments)    myalgia   Lipitor [Atorvastatin] Other (See Comments)    Myalgia    Past Surgical History:  Procedure Laterality Date   APPENDECTOMY     CARPAL TUNNEL RELEASE Left 10/27/2019   Procedure: CARPAL TUNNEL RELEASE;  Surgeon: Mack Hook, MD;  Location: [facility];  Service: Orthopedics;  Laterality: Left;   colonoscopy with polypectomy  2009; 12/01/2014   [facility].  +Diverticulosis   NERVE REPAIR Left 10/27/2019   Procedure: LEFT HAND MEDIAN NEUROPLASTY;  Surgeon: Mack Hook, MD;  Location: [facility];  Service: Orthopedics;  Laterality: Left;   SURGERY: LEFT HAND MEDIAN NEUROPLASTY WITH POSSIBLE NERVE GRAFTING  LENGTH: 2 HOURS PRE OP BLOCK   PARTIAL COLECTOMY  10/2018   Due to severe diverticulosis with bladder fistula   WISDOM TOOTH EXTRACTION     Family History  Problem Relation Age of Onset   Coronary artery disease Mother 60       died post CBAG   Allergic Disorder Daughter    Allergic Disorder Son    Cancer Neg Hx    Stroke Neg Hx    Diabetes Neg Hx    Colon cancer Neg Hx    Colon polyps Neg Hx    Esophageal cancer Neg Hx    Stomach cancer Neg Hx    Pancreatic cancer Neg Hx    Liver disease Neg Hx    Inflammatory bowel disease Neg Hx    Social History   Social History Narrative   Not on file    Allergies as of 02/20/2023       Reactions   Ciprofloxacin    Severe swelling and rash   Levofloxacin    Diffuse swelling with angioedema   Pravastatin Other (See Comments)   myalgia   Lipitor [atorvastatin] Other (See Comments)   Myalgia         Medication List        Accurate as of February 20, 2023 12:27 PM. If you have any questions, ask your nurse or doctor.          STOP taking these medications    OMEGA-3 2100 PO Stopped by: Felix Pacini       TAKE these medications    B-12 2500 MCG Tabs Take by mouth daily.   gabapentin 100 MG capsule Commonly known as: NEURONTIN 100-200 mg p.o. in the day, 300 mg nightly. Started by: Felix Pacini   Vitamin D3 250 MCG (10000 UT) capsule Take 10,000 Units by mouth daily.       All past medical history, surgical history, allergies, family history, immunizations andmedications were updated in the EMR today and reviewed under the history and medication portions of their EMR.     No results found for this or any previous visit (from the past 2160 hour(s)).  No results found.  ROS 14 pt review of systems performed and negative (unless mentioned in an HPI)  Objective: BP 128/77   Pulse 65   Temp 98 F (36.7 C)   Ht 6\' 3"  (1.905 m)   Wt 248  lb 6.4 oz (112.7 kg)   SpO2 97%   BMI 31.05 kg/m  Physical Exam Constitutional:      General: He is not in acute distress.  Appearance: Normal appearance. He is obese. He is not ill-appearing, toxic-appearing or diaphoretic.  HENT:     Head: Normocephalic and atraumatic.     Right Ear: Tympanic membrane, ear canal and external ear normal. There is no impacted cerumen.     Left Ear: Tympanic membrane, ear canal and external ear normal. There is no impacted cerumen.     Nose: Nose normal. No congestion or rhinorrhea.     Mouth/Throat:     Mouth: Mucous membranes are moist.     Pharynx: Oropharynx is clear. No oropharyngeal exudate or posterior oropharyngeal erythema.  Eyes:     General: No scleral icterus.       Right eye: No discharge.        Left eye: No discharge.     Extraocular Movements: Extraocular movements intact.     Pupils: Pupils are equal, round, and reactive to light.  Cardiovascular:     Rate and Rhythm: Normal rate and regular rhythm.     Pulses: Normal pulses.     Heart sounds: Normal heart sounds. No murmur heard.    No friction rub. No gallop.  Pulmonary:     Effort: Pulmonary effort is normal. No respiratory distress.     Breath sounds: Normal breath sounds. No stridor. No wheezing, rhonchi or rales.  Chest:     Chest wall: No tenderness.  Abdominal:     General: Abdomen is flat. Bowel sounds are normal. There is no distension.     Palpations: Abdomen is soft. There is no mass.     Tenderness: There is no abdominal tenderness. There is no right CVA tenderness, left CVA tenderness, guarding or rebound.     Hernia: No hernia is present.  Musculoskeletal:        General: No swelling or tenderness. Normal range of motion.     Cervical back: Normal range of motion and neck supple.     Right lower leg: No edema.     Left lower leg: No edema.  Lymphadenopathy:     Cervical: No cervical adenopathy.  Skin:    General: Skin is warm and dry.     Coloration: Skin  is not jaundiced.     Findings: No bruising, lesion or rash.  Neurological:     General: No focal deficit present.     Mental Status: He is alert and oriented to person, place, and time. Mental status is at baseline.     Cranial Nerves: No cranial nerve deficit.     Sensory: No sensory deficit.     Motor: No weakness.     Coordination: Coordination normal.     Gait: Gait normal.     Deep Tendon Reflexes: Reflexes normal.  Psychiatric:        Mood and Affect: Mood normal.        Behavior: Behavior normal.        Thought Content: Thought content normal.        Judgment: Judgment normal.    No results found.  Assessment/plan: MADDIN GREENLEES is a 67 y.o. male present for CPE Hyperlipidemia LDL goal <130 /Statin intolerance/Obesity (BMI 30-39.9) -Statin intolerant - Cardiac CT - pt agreeable to screening to help risk stratify him since he is statin intolerant Lipid clinic - pt will consider.  Lipids, CMP and TSH collected today Stage 3a chronic kidney disease (HCC)/Vitamin D deficiency -CMP, CBC and vitamin D collected today Prediabetes A1c collected today Prostate cancer screening - PSA collected today Neuropathy: Start  gabapentin 100-200 mg every day, and 300 mg at bedtime F/u 5.5 mos Prostate cancer screening - PSA Prediabetes - Hemoglobin A1c Adenomatous polyp of colon, unspecified part of colon Advised pt to discuss further screening recommendations with his GI specialist. By timeline he is due for rpt colonoscopy (3 f/u), however he has had much of his large intestine removed, but not all from diverticulitis.  - Ambulatory referral to Gastroenterology Routine general yearly exam at a health care facility Patient was encouraged to exercise greater than 150 minutes a week. Patient was encouraged to choose a diet filled with fresh fruits and vegetables, and lean meats. AVS provided to patient today for education/recommendation on gender specific health and safety  maintenance. Colonoscopy: 2020, polyps and diverticulosis.  Post partial colectomy/colostomy/reanastomosis-  Dr. Rhea Belton. No fhx. Unknwn where he falls with follow up now. Referral to GI to discuss.  Immunizations:  tdap UTD 2021, influenza  (encouraged yearly), prevna20 completed.. shingrix #1 completed-declined2. Infectious disease screening: HIV and  Hep C completed PSA: No fhx.   Return in about 1 year (around 02/21/2024) for cpe (20 min).   Orders Placed This Encounter  Procedures   CT CARDIAC SCORING (SELF PAY ONLY)   Comprehensive metabolic panel   CBC with Differential/Platelet   Hemoglobin A1c   TSH   PSA   Lipid panel   Ambulatory referral to Gastroenterology   Meds ordered this encounter  Medications   gabapentin (NEURONTIN) 100 MG capsule    Sig: 100-200 mg p.o. in the day, 300 mg nightly.    Dispense:  150 capsule    Refill:  5   Referral Orders         Ambulatory referral to Gastroenterology      Note is dictated utilizing voice recognition software. Although note has been proof read prior to signing, occasional typographical errors still can be missed. If any questions arise, please do not hesitate to call for verification.  Electronically signed by: Felix Pacini, DO [facility]

## 2023-02-26 ENCOUNTER — Ambulatory Visit (INDEPENDENT_AMBULATORY_CARE_PROVIDER_SITE_OTHER): Payer: Self-pay

## 2023-02-26 ENCOUNTER — Telehealth: Payer: Self-pay | Admitting: Family Medicine

## 2023-02-26 DIAGNOSIS — E785 Hyperlipidemia, unspecified: Secondary | ICD-10-CM

## 2023-02-26 DIAGNOSIS — R931 Abnormal findings on diagnostic imaging of heart and coronary circulation: Secondary | ICD-10-CM | POA: Insufficient documentation

## 2023-02-26 NOTE — Telephone Encounter (Signed)
LM for pt to return call to discuss.  

## 2023-02-26 NOTE — Telephone Encounter (Signed)
Please call patient His cardiac CT score is 63, this places him in the 42nd percentile. All coronary arteries have 0 further calcium score, with the exception of the left anterior descending.  If score is 1 to 99, it is reasonable to initiate statin therapy for patients >=67 years of age.  Since he is unable to tolerate statins, and he has an LDL of 203, we could refer him to the lipid clinic for further recommendations.  There are other 4 months of cholesterol control including injectable medications, which are injected every 2 weeks.  If he is agreeable to this, we can place referral for him today.

## 2023-02-26 NOTE — Telephone Encounter (Signed)
Returning [employer] call.  Please call tomorrow 8/13

## 2023-03-05 ENCOUNTER — Encounter: Payer: Self-pay | Admitting: Family Medicine

## 2023-03-05 DIAGNOSIS — I700 Atherosclerosis of aorta: Secondary | ICD-10-CM | POA: Insufficient documentation

## 2023-03-08 DIAGNOSIS — H40003 Preglaucoma, unspecified, bilateral: Secondary | ICD-10-CM | POA: Diagnosis not present

## 2023-05-19 DIAGNOSIS — U071 COVID-19: Secondary | ICD-10-CM | POA: Diagnosis not present

## 2023-05-21 ENCOUNTER — Telehealth: Payer: BC Managed Care – PPO | Admitting: Family Medicine

## 2023-05-21 DIAGNOSIS — U071 COVID-19: Secondary | ICD-10-CM | POA: Diagnosis not present

## 2023-05-21 DIAGNOSIS — T50905A Adverse effect of unspecified drugs, medicaments and biological substances, initial encounter: Secondary | ICD-10-CM

## 2023-05-21 MED ORDER — PREDNISONE 20 MG PO TABS: ORAL_TABLET | ORAL | 0 refills | Status: AC

## 2023-05-21 MED ORDER — DOXYCYCLINE HYCLATE 100 MG PO TABS: 100.0000 mg | ORAL_TABLET | Freq: Two times a day (BID) | ORAL | 0 refills | Status: DC

## 2023-05-21 NOTE — Progress Notes (Signed)
VIRTUAL VISIT VIA VIDEO  I connected with Javier Casey on 05/21/23 at 11:20 AM EST by a video enabled telemedicine application and verified that I am speaking with the correct person using two identifiers. Location patient: Home Location provider: [facility], Office Persons participating in the virtual visit: Patient, Dr. Claiborne Billings and Ivonne Andrew, CMA  I discussed the limitations of evaluation and management by telemedicine and the availability of in person appointments. The patient expressed understanding and agreed to proceed.    Javier Casey , 30-Jan-1956, 67 y.o., male MRN: [id number] Patient Care Team    Relationship Specialty Notifications Start End  Natalia Leatherwood, DO PCP - General Family Medicine  08/13/15   Pyrtle, Carie Caddy, MD Consulting Physician Gastroenterology  02/18/16   Chalmers Cater, MD Referring Physician Surgery  02/16/22     Chief Complaint  Patient presents with   Medication Reaction    Paxlovid; face and extremity swelling; started Sunday, started paxlovid on Saturday night; COVID + on Saturday; [facility] in [place], [place]     Subjective: Javier Casey is a 67 y.o. Pt presents for an OV with complaints of facial swelling of 1 day duration with the use of paxlovid he started on Saturday. He had never had paxlovid in the past. He was seen at [facility] and tested positive for COVID, start ed on paxlovid. He states he took antihistamine and a few prednisone he had at home and the swelling has reduced some this morning.  Denies any tongue or airway involvement. He feels weak today, but feels his covid symptoms have slightly improved.      02/20/2023    8:18 AM 02/16/2022    8:31 AM 05/07/2019    2:03 PM 03/01/2018    5:40 PM 02/23/2017    1:52 PM  Depression screen PHQ 2/9  Decreased Interest 0 0 0 0 0  Down, Depressed, Hopeless 0 0 0 0 0  PHQ - 2 Score 0 0 0 0 0    Allergies  Allergen Reactions   Ciprofloxacin     Severe swelling and rash   Levofloxacin      Diffuse swelling with angioedema   Pravastatin Other (See Comments)    myalgia   Lipitor [Atorvastatin] Other (See Comments)    Myalgia    Paxlovid [Nirmatrelvir-Ritonavir] Swelling    Lips and face   Social History   Social History Narrative   Not on file   Past Medical History:  Diagnosis Date   Allergy    swelling lips ,nose, hands and feet, tongue, but unknown reason   Asthma    Colon polyps    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   removed   Decreased range of motion of finger of right hand 09/29/2019   Diverticulitis    Diverticulosis 2016   There was severe diverticulosis noted in the ascending to sigmoid colon   Epididymal cyst 02/2016   Multiple on scrotal u/s   GERD (gastroesophageal reflux disease)    Hand trauma, left, sequela 09/29/2019   Hyperlipidemia    Hypertension    Hyperuricemia    Neuropathy 02/20/2023   Tubular adenoma of colon 2009; 2016   Past Surgical History:  Procedure Laterality Date   APPENDECTOMY     CARPAL TUNNEL RELEASE Left 10/27/2019   Procedure: CARPAL TUNNEL RELEASE;  Surgeon: Mack Hook, MD;  Location: [facility];  Service: Orthopedics;  Laterality: Left;   colonoscopy with polypectomy  2009; 12/01/2014   [facility].  +Diverticulosis   NERVE REPAIR Left 10/27/2019   Procedure: LEFT HAND MEDIAN NEUROPLASTY;  Surgeon: Mack Hook, MD;  Location: [facility];  Service: Orthopedics;  Laterality: Left;  SURGERY: LEFT HAND MEDIAN NEUROPLASTY WITH POSSIBLE NERVE GRAFTING  LENGTH: 2 HOURS PRE OP BLOCK   PARTIAL COLECTOMY  10/2018   Due to severe diverticulosis with bladder fistula   WISDOM TOOTH EXTRACTION     Family History  Problem Relation Age of Onset   Coronary artery disease Mother 89       died post CBAG   Allergic Disorder Daughter    Allergic Disorder Son    Cancer Neg Hx    Stroke Neg Hx    Diabetes Neg Hx    Colon cancer Neg Hx    Colon polyps Neg Hx    Esophageal cancer Neg Hx     Stomach cancer Neg Hx    Pancreatic cancer Neg Hx    Liver disease Neg Hx    Inflammatory bowel disease Neg Hx    Allergies as of 05/21/2023       Reactions   Ciprofloxacin    Severe swelling and rash   Levofloxacin    Diffuse swelling with angioedema   Pravastatin Other (See Comments)   myalgia   Lipitor [atorvastatin] Other (See Comments)   Myalgia    Paxlovid [nirmatrelvir-ritonavir] Swelling   Lips and face        Medication List        Accurate as of May 21, 2023 11:44 AM. If you have any questions, ask your nurse or doctor.          B-12 2500 MCG Tabs Take by mouth daily.   doxycycline 100 MG tablet Commonly known as: VIBRA-TABS Take 1 tablet (100 mg total) by mouth 2 (two) times daily.   gabapentin 100 MG capsule Commonly known as: NEURONTIN 100-200 mg p.o. in the day, 300 mg nightly.   predniSONE 20 MG tablet Commonly known as: DELTASONE 60 mg x3d, 40 mg x3d, 20 mg x2d, 10 mg x2d   Vitamin D3 250 MCG (10000 UT) capsule Take 10,000 Units by mouth daily.        All past medical history, surgical history, allergies, family history, immunizations andmedications were updated in the EMR today and reviewed under the history and medication portions of their EMR.     ROS Negative, with the exception of above mentioned in HPI   Objective:  There were no vitals taken for this visit. There is no height or weight on file to calculate BMI. Physical Exam Vitals and nursing note reviewed.  Constitutional:      General: He is not in acute distress.    Appearance: Normal appearance. He is not toxic-appearing.  HENT:     Head: Normocephalic and atraumatic.     Mouth/Throat:     Mouth: Angioedema present.     Comments: Lip swelling, swelling bilateral cheeks.  Eyes:     General: No scleral icterus.       Right eye: No discharge.        Left eye: No discharge.     Conjunctiva/sclera: Conjunctivae normal.  Pulmonary:     Effort: Pulmonary effort is  normal.  Musculoskeletal:     Cervical back: Normal range of motion.  Skin:    Findings: No rash.  Neurological:     Mental Status: He is alert and oriented to person, place, and time. Mental  status is at baseline.  Psychiatric:        Mood and Affect: Mood normal.        Behavior: Behavior normal.        Thought Content: Thought content normal.        Judgment: Judgment normal.     No results found. No results found. No results found for this or any previous visit (from the past 24 hour(s)).  Assessment/Plan: Javier Casey is a 67 y.o. male present for OV for  Covid illness Afebrile Rest, hydrate.  mucinex (DM if cough), nettie pot or nasal saline.  Doxy bid prescribed, take until completed if started. Pt understands to start abx only of needed after 7 days of illness. Discussed appropriate use If cough present it can last up to 6-8 weeks.  F/U 2 weeks if not improved, sooner if worsening  Medication reaction: Added paxlovid to his allergy list Continue antihistamine  Start prednisone taper.   Reviewed expectations re: course of current medical issues. Discussed self-management of symptoms. Outlined signs and symptoms indicating need for more acute intervention. Patient verbalized understanding and all questions were answered. Patient received an After-Visit Summary.    No orders of the defined types were placed in this encounter.  Meds ordered this encounter  Medications   predniSONE (DELTASONE) 20 MG tablet    Sig: 60 mg x3d, 40 mg x3d, 20 mg x2d, 10 mg x2d    Dispense:  18 tablet    Refill:  0   doxycycline (VIBRA-TABS) 100 MG tablet    Sig: Take 1 tablet (100 mg total) by mouth 2 (two) times daily.    Dispense:  20 tablet    Refill:  0   Referral Orders  No referral(s) requested today     Note is dictated utilizing voice recognition software. Although note has been proof read prior to signing, occasional typographical errors still can be missed. If  any questions arise, please do not hesitate to call for verification.   electronically signed by:  Felix Pacini, DO  [facility] - [place]

## 2023-07-20 ENCOUNTER — Ambulatory Visit: Payer: BC Managed Care – PPO | Admitting: Internal Medicine

## 2023-07-20 ENCOUNTER — Encounter: Payer: Self-pay | Admitting: Internal Medicine

## 2023-07-20 VITALS — BP 120/76 | HR 80 | Ht 74.0 in | Wt 256.0 lb

## 2023-07-20 DIAGNOSIS — K432 Incisional hernia without obstruction or gangrene: Secondary | ICD-10-CM | POA: Diagnosis not present

## 2023-07-20 DIAGNOSIS — Z8719 Personal history of other diseases of the digestive system: Secondary | ICD-10-CM | POA: Diagnosis not present

## 2023-07-20 DIAGNOSIS — Z860101 Personal history of adenomatous and serrated colon polyps: Secondary | ICD-10-CM

## 2023-07-20 DIAGNOSIS — Z9049 Acquired absence of other specified parts of digestive tract: Secondary | ICD-10-CM | POA: Diagnosis not present

## 2023-07-20 DIAGNOSIS — Z860100 Personal history of colon polyps, unspecified: Secondary | ICD-10-CM

## 2023-07-20 MED ORDER — NA SULFATE-K SULFATE-MG SULF 17.5-3.13-1.6 GM/177ML PO SOLN: 1.0000 | Freq: Once | ORAL | 0 refills | Status: AC

## 2023-07-20 NOTE — Progress Notes (Signed)
 Patient ID: Javier Casey, male   DOB: [birth year], 68 y.o.   MRN: [id number] HPI: Discussed the use of AI scribe software for clinical note transcription with the patient, who gave verbal consent to proceed.  History of Present Illness   Javier Casey is a 68 year old male with a history of adenomatous and sessile serrated colon polyps, diverticulosis and complicated diverticulitis with abdominal abscess and colovesicular fistula status post repair in 2020, hyperlipidemia, hypertension who is here to discuss colon polyp surveillance and diverticulosis.    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 Since the surgeries, the patient reports no significant bowel issues, no bleeding, pain, or further episodes of diverticulitis.  The patient had a colonoscopy in May 2016, during which six precancerous polyps were removed. The patient also has a history of bladder infections, with E. coli identified as the causative bacteria, requiring nearly a year of antibiotic treatment. The patient also experienced a recurring tooth abscess, which took six months to treat.  The patient has made significant lifestyle changes, including adopting a low-carbohydrate diet, which led to a weight loss of 60 pounds over the past three to four years. However, the patient reports a recent weight gain of five to six pounds since the holiday season.  The patient also reports a small incisional hernia at the site of the previous surgical incision, which occasionally causes discomfort, particularly after physical exertion such as raking leaves. The patient is considering retirement at the end of February and expresses concerns about changes in insurance coverage.      Past Medical History:  Diagnosis Date   Allergy    swelling lips ,nose, hands and feet, tongue, but unknown reason   Asthma    Colon polyps    Colostomy  in place (HCC) 01/09/2019   removed   Decreased range of motion of finger of right hand 09/29/2019   Diverticulitis    Diverticulosis 2016   There was severe diverticulosis noted in the ascending to sigmoid colon   Epididymal cyst 02/2016   Multiple on scrotal u/s   GERD (gastroesophageal reflux disease)    Hand trauma, left, sequela 09/29/2019   Hyperlipidemia    Hypertension    Hyperuricemia    Neuropathy 02/20/2023   Tubular adenoma of colon 2009; 2016    Past Surgical History:  Procedure Laterality Date   APPENDECTOMY     CARPAL TUNNEL RELEASE Left 10/27/2019   Procedure: CARPAL TUNNEL RELEASE;  Surgeon: Sebastian Lenis, MD;  Location: [facility];  Service: Orthopedics;  Laterality: Left;   colonoscopy with polypectomy  2009; 12/01/2014   [facility].  +Diverticulosis   NERVE REPAIR Left 10/27/2019   Procedure: LEFT HAND MEDIAN NEUROPLASTY;  Surgeon: Sebastian Lenis, MD;  Location: [facility];  Service: Orthopedics;  Laterality: Left;  SURGERY: LEFT HAND MEDIAN NEUROPLASTY WITH POSSIBLE NERVE GRAFTING  LENGTH: 2 HOURS PRE OP BLOCK   PARTIAL COLECTOMY  10/2018   Due to severe diverticulosis with bladder fistula   WISDOM TOOTH EXTRACTION      Outpatient Medications Prior to Visit  Medication Sig Dispense Refill   Cholecalciferol (VITAMIN D3) 10000 UNITS capsule Take 10,000 Units by mouth daily.     Cyanocobalamin  (B-12) 2500 MCG TABS Take by mouth daily.     doxycycline  (VIBRA -TABS) 100 MG tablet Take 1 tablet (100 mg total) by mouth 2 (two) times daily. 20  tablet 0   gabapentin  (NEURONTIN ) 100 MG capsule 100-200 mg p.o. in the day, 300 mg nightly. 150 capsule 5   predniSONE  (DELTASONE ) 20 MG tablet 60 mg x3d, 40 mg x3d, 20 mg x2d, 10 mg x2d 18 tablet 0   No facility-administered medications prior to visit.    Allergies  Allergen Reactions   Ciprofloxacin     Severe swelling and rash   Levofloxacin     Diffuse swelling with angioedema    Pravastatin  Other (See Comments)    myalgia   Lipitor [Atorvastatin ] Other (See Comments)    Myalgia    Paxlovid [Nirmatrelvir-Ritonavir] Swelling    Lips and face    Family History  Problem Relation Age of Onset   Coronary artery disease Mother 86       died post CBAG   Allergic Disorder Daughter    Allergic Disorder Son    Cancer Neg Hx    Stroke Neg Hx    Diabetes Neg Hx    Colon cancer Neg Hx    Colon polyps Neg Hx    Esophageal cancer Neg Hx    Stomach cancer Neg Hx    Pancreatic cancer Neg Hx    Liver disease Neg Hx    Inflammatory bowel disease Neg Hx     Social History   Tobacco Use   Smoking status: Never   Smokeless tobacco: Never  Vaping Use   Vaping status: Never Used  Substance Use Topics   Alcohol use: Not Currently    Comment: 2-3x per week   Drug use: No    ROS: As per history of present illness, otherwise negative  BP 120/76   Pulse 80   Ht 6' 2 (1.88 m)   Wt 256 lb (116.1 kg)   BMI 32.87 kg/m  Gen: awake, alert, NAD HEENT: anicteric  CV: RRR, no mrg Pulm: CTA b/l Abd: soft, NT/ND, +BS throughout, umbilical incisional hernia approximately 4 cm which is easily reducible and nontender today Ext: no c/c/e Neuro: nonfocal   RELEVANT LABS AND IMAGING: CBC    Component Value Date/Time   WBC 5.1 02/20/2023 0819   RBC 5.34 02/20/2023 0819   HGB 15.6 02/20/2023 0819   HCT 48.4 02/20/2023 0819   PLT 175.0 02/20/2023 0819   MCV 90.5 02/20/2023 0819   MCH 28.6 02/18/2016 1617   MCHC 32.3 02/20/2023 0819   RDW 14.9 02/20/2023 0819   LYMPHSABS 1.3 02/20/2023 0819   MONOABS 0.5 02/20/2023 0819   EOSABS 0.2 02/20/2023 0819   BASOSABS 0.0 02/20/2023 0819    CMP     Component Value Date/Time   NA 142 02/20/2023 0819   K 4.6 02/20/2023 0819   CL 105 02/20/2023 0819   CO2 26 02/20/2023 0819   GLUCOSE 104 (H) 02/20/2023 0819   BUN 25 (H) 02/20/2023 0819   CREATININE 1.58 (H) 02/20/2023 0819   CREATININE 1.65 (H) 09/06/2016 1548    CALCIUM  9.9 02/20/2023 0819   PROT 6.8 02/20/2023 0819   ALBUMIN 4.4 02/20/2023 0819   AST 16 02/20/2023 0819   ALT 21 02/20/2023 0819   ALKPHOS 41 02/20/2023 0819   BILITOT 0.5 02/20/2023 0819   GFRNONAA 53 (L) 10/23/2019 1330   GFRNONAA 44 (L) 09/06/2016 1548   GFRAA >60 10/23/2019 1330   GFRAA 51 (L) 09/06/2016 1548    Results   DIAGNOSTIC Colonoscopy: Six polyps ranging from 3 to 10 mm, diverticulosis (11/2014) Surgery: Sigmoid removal, colostomy, repair of colovesical fistula to bladder, drainage  of abdominal wall abscess (11/12/2018) Surgery: Colostomy takedown, removal of adhesions, reanastomosis (01/09/2019)      ASSESSMENT/PLAN:     History of Diverticulosis and Diverticulitis Previous sigmoid colon resection due to perforation and colovesicular fistula. No current symptoms of diverticulitis. No current bowel issues. -Recommend colonoscopy to assess for polyps given previous history of precancerous polyps. -Continue current diet  History of adenomatous and sessile serrated colon polyps including those greater than a centimeter -Surveillance colonoscopy overdue and recommended at this time.  We reviewed the risk, benefits and alternatives and he is agreeable and wishes to proceed  Incisional Hernia Noted on physical exam. No significant pain or limitation of activities reported. -Consider repair if it becomes symptomatic or limits activities. -Consider use of abdominal binder during activities that may exacerbate symptoms.  Rr:[id number], Charlies DELENA Rosalea sharlee Fleet katheren [place],  [place] [id number]

## 2023-07-20 NOTE — Patient Instructions (Signed)
 You have been scheduled for a colonoscopy. Please follow written instructions given to you at your visit today.   Please pick up your prep supplies at the pharmacy within the next 1-3 days.  If you use inhalers (even only as needed), please bring them with you on the day of your procedure.  DO NOT TAKE 7 DAYS PRIOR TO TEST- Trulicity (dulaglutide) Ozempic, Wegovy (semaglutide) Mounjaro (tirzepatide) Bydureon Bcise (exanatide extended release)  DO NOT TAKE 1 DAY PRIOR TO YOUR TEST Rybelsus (semaglutide) Adlyxin (lixisenatide) Victoza (liraglutide) Byetta (exanatide) ___________________________________________________________________________   If your blood pressure at your visit was 140/90 or greater, please contact your primary care physician to follow up on this.  _______________________________________________________  If you are age 2 or older, your body mass index should be between 23-30. Your Body mass index is 32.87 kg/m. If this is out of the aforementioned range listed, please consider follow up with your Primary Care Provider.  If you are age 77 or younger, your body mass index should be between 19-25. Your Body mass index is 32.87 kg/m. If this is out of the aformentioned range listed, please consider follow up with your Primary Care Provider.   ________________________________________________________  The [facility] providers would like to encourage you to use MYCHART to communicate with providers for non-urgent requests or questions.  Due to long hold times on the telephone, sending your provider a message by [facility] may be a faster and more efficient way to get a response.  Please allow 48 business hours for a response.  Please remember that this is for non-urgent requests.  _______________________________________________________

## 2023-08-31 ENCOUNTER — Encounter: Payer: Self-pay | Admitting: Internal Medicine

## 2023-09-06 ENCOUNTER — Encounter: Payer: Self-pay | Admitting: Certified Registered Nurse Anesthetist

## 2023-09-10 ENCOUNTER — Ambulatory Visit (AMBULATORY_SURGERY_CENTER): Payer: BC Managed Care – PPO | Admitting: Internal Medicine

## 2023-09-10 ENCOUNTER — Encounter: Payer: Self-pay | Admitting: Internal Medicine

## 2023-09-10 VITALS — BP 140/90 | HR 74 | Temp 97.8°F | Resp 10 | Ht 74.0 in | Wt 256.0 lb

## 2023-09-10 DIAGNOSIS — Z1211 Encounter for screening for malignant neoplasm of colon: Secondary | ICD-10-CM

## 2023-09-10 DIAGNOSIS — Z860101 Personal history of adenomatous and serrated colon polyps: Secondary | ICD-10-CM | POA: Diagnosis not present

## 2023-09-10 DIAGNOSIS — K621 Rectal polyp: Secondary | ICD-10-CM | POA: Diagnosis not present

## 2023-09-10 DIAGNOSIS — K5730 Diverticulosis of large intestine without perforation or abscess without bleeding: Secondary | ICD-10-CM | POA: Diagnosis not present

## 2023-09-10 DIAGNOSIS — K648 Other hemorrhoids: Secondary | ICD-10-CM | POA: Diagnosis not present

## 2023-09-10 DIAGNOSIS — D128 Benign neoplasm of rectum: Secondary | ICD-10-CM

## 2023-09-10 DIAGNOSIS — Z980 Intestinal bypass and anastomosis status: Secondary | ICD-10-CM

## 2023-09-10 DIAGNOSIS — D123 Benign neoplasm of transverse colon: Secondary | ICD-10-CM | POA: Diagnosis not present

## 2023-09-10 DIAGNOSIS — Z860100 Personal history of colon polyps, unspecified: Secondary | ICD-10-CM

## 2023-09-10 MED ORDER — SODIUM CHLORIDE 0.9 % IV SOLN
500.0000 mL | INTRAVENOUS | Status: DC
Start: 2023-09-10 — End: 2023-09-10

## 2023-09-10 NOTE — Progress Notes (Signed)
 Called to room to assist during endoscopic procedure.  Patient ID and intended procedure confirmed with present staff. Received instructions for my participation in the procedure from the performing physician.

## 2023-09-10 NOTE — Progress Notes (Unsigned)
 Report given to PACU, vss

## 2023-09-10 NOTE — Op Note (Signed)
 [facility] Patient Name: Javier Casey Procedure Date: 09/10/2023 4:21 PM MRN: [id number] Endoscopist: Beverley Fiedler , MD, [id number] Age: 68 Referring MD:  Date of Birth: 1955-12-24 Gender: Male Account #: [id number] Procedure:                Colonoscopy Indications:              High risk colon cancer surveillance: Personal                            history of adenomas and SSPs (10 mm or greater in                            size in 2009 and 2017), Last colonoscopy: May 2017                            (TA and SSP x 6), 2009 (TA x 1 = 30 mm) Medicines:                Monitored Anesthesia Care Procedure:                Pre-Anesthesia Assessment:                           - Prior to the procedure, a History and Physical                            was performed, and patient medications and                            allergies were reviewed. The patient's tolerance of                            previous anesthesia was also reviewed. The risks                            and benefits of the procedure and the sedation                            options and risks were discussed with the patient.                            All questions were answered, and informed consent                            was obtained. Prior Anticoagulants: The patient has                            taken no anticoagulant or antiplatelet agents. ASA                            Grade Assessment: III - A patient with severe                            systemic disease. After reviewing the risks and  benefits, the patient was deemed in satisfactory                            condition to undergo the procedure.                           After obtaining informed consent, the colonoscope                            was passed under direct vision. Throughout the                            procedure, the patient's blood pressure, pulse, and                            oxygen saturations were  monitored continuously. The                            [id number]-HQ190L Colonoscope [id number] was introduced                            through the anus and advanced to the cecum,                            identified by appendiceal orifice and ileocecal                            valve. The colonoscopy was performed without                            difficulty. The patient tolerated the procedure                            well. The quality of the bowel preparation was                            good. The ileocecal valve, appendiceal orifice, and                            rectum were photographed. Scope In: 4:25:49 PM Scope Out: 4:40:38 PM Scope Withdrawal Time: 0 hours 13 minutes 12 seconds  Total Procedure Duration: 0 hours 14 minutes 49 seconds  Findings:                 The digital rectal exam was normal.                           Two sessile polyps were found in the transverse                            colon. The polyps were 3 to 10 mm in size. These                            polyps were removed with a cold snare. Resection  and retrieval were complete.                           A 3 mm polyp was found in the rectum. The polyp was                            sessile. The polyp was removed with a cold snare.                            Resection and retrieval were complete.                           Multiple large-mouthed, medium-mouthed and                            small-mouthed diverticula were found in the                            descending colon, transverse colon and ascending                            colon.                           There was evidence of a prior end-to-end                            colo-colonic anastomosis in the sigmoid colon. This                            was patent and was characterized by healthy                            appearing mucosa.                           Internal hemorrhoids were found during                             retroflexion. The hemorrhoids were medium-sized. Complications:            No immediate complications. Estimated Blood Loss:     Estimated blood loss was minimal. Impression:               - Two 3 and 10 mm polyps in the transverse colon,                            removed with a cold snare. Resected and retrieved.                           - One 3 mm polyp in the rectum, removed with a cold                            snare. Resected and retrieved.                           -  Moderate diverticulosis in the descending colon,                            in the transverse colon and in the ascending colon.                           - Patent end-to-end colo-colonic anastomosis,                            characterized by healthy appearing mucosa.                           - Internal hemorrhoids. Recommendation:           - Patient has a contact number available for                            emergencies. The signs and symptoms of potential                            delayed complications were discussed with the                            patient. Return to normal activities tomorrow.                            Written discharge instructions were provided to the                            patient.                           - Resume previous diet.                           - Continue present medications.                           - Await pathology results.                           - Repeat colonoscopy is recommended for                            surveillance. The colonoscopy date will be                            determined after pathology results from today's                            exam become available for review. Beverley Fiedler, MD 09/10/2023 4:44:07 PM This report has been signed electronically.

## 2023-09-10 NOTE — Patient Instructions (Signed)
Discharge instructions given. Handouts on polyps,diverticulosis and Hemorrhoids. Resume previous medications. YOU HAD AN ENDOSCOPIC PROCEDURE TODAY AT THE [facility]:   Refer to the procedure report that was given to you for any specific questions about what was found during the examination.  If the procedure report does not answer your questions, please call your gastroenterologist to clarify.  If you requested that your care partner not be given the details of your procedure findings, then the procedure report has been included in a sealed envelope for you to review at your convenience later.  YOU SHOULD EXPECT: Some feelings of bloating in the abdomen. Passage of more gas than usual.  Walking can help get rid of the air that was put into your GI tract during the procedure and reduce the bloating. If you had a lower endoscopy (such as a colonoscopy or flexible sigmoidoscopy) you may notice spotting of blood in your stool or on the toilet paper. If you underwent a bowel prep for your procedure, you may not have a normal bowel movement for a few days.  Please Note:  You might notice some irritation and congestion in your nose or some drainage.  This is from the oxygen used during your procedure.  There is no need for concern and it should clear up in a day or so.  SYMPTOMS TO REPORT IMMEDIATELY:  Following lower endoscopy (colonoscopy or flexible sigmoidoscopy):  Excessive amounts of blood in the stool  Significant tenderness or worsening of abdominal pains  Swelling of the abdomen that is new, acute  Fever of 100F or higher   For urgent or emergent issues, a gastroenterologist can be reached at any hour by calling [phone]. Do not use MyChart messaging for urgent concerns.    DIET:  We do recommend a small meal at first, but then you may proceed to your regular diet.  Drink plenty of fluids but you should avoid alcoholic beverages for 24 hours.  ACTIVITY:  You should  plan to take it easy for the rest of today and you should NOT DRIVE or use heavy machinery until tomorrow (because of the sedation medicines used during the test).    FOLLOW UP: Our staff will call the number listed on your records the next business day following your procedure.  We will call around 7:15- 8:00 am to check on you and address any questions or concerns that you may have regarding the information given to you following your procedure. If we do not reach you, we will leave a message.     If any biopsies were taken you will be contacted by phone or by letter within the next 1-3 weeks.  Please call us at [phone] if you have not heard about the biopsies in 3 weeks.    SIGNATURES/CONFIDENTIALITY: You and/or your care partner have signed paperwork which will be entered into your electronic medical record.  These signatures attest to the fact that that the information above on your After Visit Summary has been reviewed and is understood.  Full responsibility of the confidentiality of this discharge information lies with you and/or your care-partner. 

## 2023-09-11 ENCOUNTER — Telehealth: Payer: Self-pay

## 2023-09-11 NOTE — Telephone Encounter (Signed)
  Follow up Call-     09/10/2023    3:20 PM  Call back number  Post procedure Call Back phone  # [phone]  Permission to leave phone message --  comments voicemail not set up-pt states he always answers his cell phone     Patient questions:  Do you have a fever, pain , or abdominal swelling? No. Pain Score  0 *  Have you tolerated food without any problems? Yes.    Have you been able to return to your normal activities? Yes.  yes  Do you have any questions about your discharge instructions: Diet   No. Medications  No. Follow up visit  No.  Do you have questions or concerns about your Care? No.  Actions: * If pain score is 4 or above: No action needed, pain <4.

## 2023-09-13 ENCOUNTER — Encounter: Payer: Self-pay | Admitting: Internal Medicine

## 2023-09-13 LAB — SURGICAL PATHOLOGY
